# Patient Record
Sex: Male | Born: 1960 | Race: White | Hispanic: No | Marital: Married | Smoking: Current every day smoker
Health system: Southern US, Community
[De-identification: ages and names within clinical notes are randomized; demographics above are authoritative.]

## PROBLEM LIST (undated history)

## (undated) ENCOUNTER — Ambulatory Visit (HOSPITAL_COMMUNITY): Admission: EM | Payer: Medicaid - Out of State | Source: Home / Self Care

## (undated) DIAGNOSIS — R058 Other specified cough: Secondary | ICD-10-CM

## (undated) DIAGNOSIS — I10 Essential (primary) hypertension: Secondary | ICD-10-CM

## (undated) DIAGNOSIS — Z9189 Other specified personal risk factors, not elsewhere classified: Secondary | ICD-10-CM

## (undated) DIAGNOSIS — N201 Calculus of ureter: Secondary | ICD-10-CM

## (undated) DIAGNOSIS — N200 Calculus of kidney: Secondary | ICD-10-CM

## (undated) DIAGNOSIS — E119 Type 2 diabetes mellitus without complications: Secondary | ICD-10-CM

## (undated) DIAGNOSIS — K7460 Unspecified cirrhosis of liver: Secondary | ICD-10-CM

## (undated) DIAGNOSIS — J410 Simple chronic bronchitis: Secondary | ICD-10-CM

## (undated) DIAGNOSIS — Z8669 Personal history of other diseases of the nervous system and sense organs: Secondary | ICD-10-CM

## (undated) DIAGNOSIS — R05 Cough: Secondary | ICD-10-CM

## (undated) DIAGNOSIS — R51 Headache: Secondary | ICD-10-CM

## (undated) DIAGNOSIS — Z87442 Personal history of urinary calculi: Secondary | ICD-10-CM

## (undated) DIAGNOSIS — K7581 Nonalcoholic steatohepatitis (NASH): Secondary | ICD-10-CM

## (undated) HISTORY — PX: EXTRACORPOREAL SHOCK WAVE LITHOTRIPSY: SHX1557

## (undated) HISTORY — PX: CHOLECYSTECTOMY: SHX55

## (undated) HISTORY — PX: KIDNEY STONE SURGERY: SHX686

---

## 2002-01-18 HISTORY — PX: CARDIOVASCULAR STRESS TEST: SHX262

## 2002-10-10 ENCOUNTER — Encounter: Payer: Self-pay | Admitting: Emergency Medicine

## 2002-10-11 ENCOUNTER — Inpatient Hospital Stay (HOSPITAL_COMMUNITY): Admission: EM | Admit: 2002-10-11 | Discharge: 2002-10-13 | Payer: Self-pay | Admitting: Emergency Medicine

## 2002-10-17 ENCOUNTER — Encounter: Admission: RE | Admit: 2002-10-17 | Discharge: 2002-10-17 | Payer: Self-pay | Admitting: Sports Medicine

## 2002-12-27 ENCOUNTER — Emergency Department (HOSPITAL_COMMUNITY): Admission: EM | Admit: 2002-12-27 | Discharge: 2002-12-28 | Payer: Self-pay | Admitting: Emergency Medicine

## 2003-11-13 ENCOUNTER — Emergency Department (HOSPITAL_COMMUNITY): Admission: EM | Admit: 2003-11-13 | Discharge: 2003-11-13 | Payer: Self-pay | Admitting: Emergency Medicine

## 2006-02-23 ENCOUNTER — Emergency Department (HOSPITAL_COMMUNITY): Admission: EM | Admit: 2006-02-23 | Discharge: 2006-02-23 | Payer: Self-pay | Admitting: Family Medicine

## 2006-06-19 HISTORY — PX: OTHER SURGICAL HISTORY: SHX169

## 2006-12-12 ENCOUNTER — Emergency Department (HOSPITAL_COMMUNITY): Admission: EM | Admit: 2006-12-12 | Discharge: 2006-12-12 | Payer: Self-pay | Admitting: Emergency Medicine

## 2007-05-02 ENCOUNTER — Emergency Department (HOSPITAL_COMMUNITY): Admission: EM | Admit: 2007-05-02 | Discharge: 2007-05-02 | Payer: Self-pay | Admitting: Emergency Medicine

## 2007-12-19 HISTORY — PX: OTHER SURGICAL HISTORY: SHX169

## 2008-08-19 ENCOUNTER — Emergency Department (HOSPITAL_COMMUNITY): Admission: EM | Admit: 2008-08-19 | Discharge: 2008-08-20 | Payer: Self-pay | Admitting: Emergency Medicine

## 2008-10-16 ENCOUNTER — Emergency Department (HOSPITAL_COMMUNITY): Admission: EM | Admit: 2008-10-16 | Discharge: 2008-10-16 | Payer: Self-pay | Admitting: Emergency Medicine

## 2009-01-30 ENCOUNTER — Emergency Department (HOSPITAL_COMMUNITY): Admission: EM | Admit: 2009-01-30 | Discharge: 2009-01-30 | Payer: Self-pay | Admitting: Emergency Medicine

## 2009-03-18 ENCOUNTER — Emergency Department (HOSPITAL_COMMUNITY): Admission: EM | Admit: 2009-03-18 | Discharge: 2009-03-19 | Payer: Self-pay | Admitting: Emergency Medicine

## 2009-03-23 ENCOUNTER — Observation Stay (HOSPITAL_COMMUNITY): Admission: EM | Admit: 2009-03-23 | Discharge: 2009-03-24 | Payer: Self-pay | Admitting: Emergency Medicine

## 2010-04-05 LAB — URINALYSIS, ROUTINE W REFLEX MICROSCOPIC
Glucose, UA: 500 mg/dL — AB
Ketones, ur: NEGATIVE mg/dL
Nitrite: NEGATIVE
Protein, ur: 100 mg/dL — AB
Specific Gravity, Urine: 1.025 (ref 1.005–1.030)
Urobilinogen, UA: 4 mg/dL — ABNORMAL HIGH (ref 0.0–1.0)
pH: 6.5 (ref 5.0–8.0)

## 2010-04-05 LAB — CBC
HCT: 49.2 % (ref 39.0–52.0)
Hemoglobin: 16.6 g/dL (ref 13.0–17.0)
MCHC: 33.8 g/dL (ref 30.0–36.0)
MCV: 88.2 fL (ref 78.0–100.0)
Platelets: 141 10*3/uL — ABNORMAL LOW (ref 150–400)
RBC: 5.58 MIL/uL (ref 4.22–5.81)
RDW: 12.8 % (ref 11.5–15.5)
WBC: 7.1 10*3/uL (ref 4.0–10.5)

## 2010-04-05 LAB — BASIC METABOLIC PANEL
BUN: 14 mg/dL (ref 6–23)
CO2: 26 mEq/L (ref 19–32)
Calcium: 9.2 mg/dL (ref 8.4–10.5)
Chloride: 102 mEq/L (ref 96–112)
Creatinine, Ser: 0.75 mg/dL (ref 0.4–1.5)
GFR calc Af Amer: >60 mL/min (ref >60)
GFR calc non Af Amer: >60 mL/min (ref >60)
Glucose, Bld: 213 mg/dL — ABNORMAL HIGH (ref 70–99)
Potassium: 4 mEq/L (ref 3.5–5.1)
Sodium: 135 mEq/L (ref 135–145)

## 2010-04-05 LAB — URINE MICROSCOPIC-ADD ON

## 2010-04-05 LAB — URINE CULTURE: Colony Count: 35000

## 2010-04-12 LAB — URINALYSIS, ROUTINE W REFLEX MICROSCOPIC
Bilirubin Urine: NEGATIVE
Glucose, UA: NEGATIVE mg/dL
Glucose, UA: NEGATIVE mg/dL
Ketones, ur: NEGATIVE mg/dL
Nitrite: NEGATIVE
Nitrite: NEGATIVE
Protein, ur: 100 mg/dL — AB
Protein, ur: 30 mg/dL — AB
Specific Gravity, Urine: 1.019 (ref 1.005–1.030)
Urobilinogen, UA: 1 mg/dL (ref 0.0–1.0)
Urobilinogen, UA: 1 mg/dL (ref 0.0–1.0)
pH: 5.5 (ref 5.0–8.0)

## 2010-04-12 LAB — URINE MICROSCOPIC-ADD ON

## 2010-04-12 LAB — BASIC METABOLIC PANEL
BUN: 23 mg/dL (ref 6–23)
CO2: 24 mEq/L (ref 19–32)
Calcium: 10 mg/dL (ref 8.4–10.5)
Chloride: 104 mEq/L (ref 96–112)
Creatinine, Ser: 1.13 mg/dL (ref 0.4–1.5)
GFR calc Af Amer: >60 mL/min (ref >60)
GFR calc non Af Amer: >60 mL/min (ref >60)
Glucose, Bld: 119 mg/dL — ABNORMAL HIGH (ref 70–99)
Potassium: 3.8 mEq/L (ref 3.5–5.1)
Sodium: 140 mEq/L (ref 135–145)

## 2010-04-12 LAB — CBC
HCT: 49.2 % (ref 39.0–52.0)
MCV: 88.4 fL (ref 78.0–100.0)
MCV: 88.5 fL (ref 78.0–100.0)
Platelets: 159 10*3/uL (ref 150–400)
Platelets: 180 10*3/uL (ref 150–400)
RDW: 12.8 % (ref 11.5–15.5)
WBC: 13.3 10*3/uL — ABNORMAL HIGH (ref 4.0–10.5)

## 2010-04-12 LAB — DIFFERENTIAL
Basophils Absolute: 0.1 10*3/uL (ref 0.0–0.1)
Basophils Absolute: 0.2 10*3/uL — ABNORMAL HIGH (ref 0.0–0.1)
Basophils Relative: 2 % — ABNORMAL HIGH (ref 0–1)
Eosinophils Absolute: 0.2 10*3/uL (ref 0.0–0.7)
Eosinophils Absolute: 0.4 10*3/uL (ref 0.0–0.7)
Eosinophils Relative: 4 % (ref 0–5)
Lymphs Abs: 2.7 10*3/uL (ref 0.7–4.0)
Monocytes Absolute: 0.9 10*3/uL (ref 0.1–1.0)
Neutrophils Relative %: 66 % (ref 43–77)

## 2010-04-12 LAB — URINE CULTURE
Colony Count: NO GROWTH
Culture: NO GROWTH

## 2010-04-12 LAB — POCT I-STAT, CHEM 8
Calcium, Ion: 1.21 mmol/L (ref 1.12–1.32)
Creatinine, Ser: 0.9 mg/dL (ref 0.4–1.5)
Hemoglobin: 17.3 g/dL — ABNORMAL HIGH (ref 13.0–17.0)
Sodium: 140 mEq/L (ref 135–145)
TCO2: 31 mmol/L (ref 0–100)

## 2010-04-24 LAB — URINE MICROSCOPIC-ADD ON

## 2010-04-24 LAB — BASIC METABOLIC PANEL
BUN: 20 mg/dL (ref 6–23)
CO2: 25 mEq/L (ref 19–32)
Calcium: 9.6 mg/dL (ref 8.4–10.5)
Creatinine, Ser: 0.83 mg/dL (ref 0.4–1.5)
GFR calc non Af Amer: >60 mL/min (ref >60)
Glucose, Bld: 88 mg/dL (ref 70–99)
Sodium: 139 mEq/L (ref 135–145)

## 2010-04-24 LAB — URINALYSIS, ROUTINE W REFLEX MICROSCOPIC
Ketones, ur: NEGATIVE mg/dL
Protein, ur: 100 mg/dL — AB
Urobilinogen, UA: 2 mg/dL — ABNORMAL HIGH (ref 0.0–1.0)

## 2010-04-24 LAB — DIFFERENTIAL
Basophils Absolute: 0 10*3/uL (ref 0.0–0.1)
Basophils Relative: 0 % (ref 0–1)
Lymphocytes Relative: 27 % (ref 12–46)
Neutro Abs: 5.8 10*3/uL (ref 1.7–7.7)
Neutrophils Relative %: 61 % (ref 43–77)

## 2010-04-24 LAB — CBC
Hemoglobin: 16.6 g/dL (ref 13.0–17.0)
MCHC: 34.1 g/dL (ref 30.0–36.0)
Platelets: 156 10*3/uL (ref 150–400)
RDW: 13.1 % (ref 11.5–15.5)

## 2010-04-24 LAB — URINE CULTURE
Colony Count: NO GROWTH
Culture: NO GROWTH

## 2010-04-25 LAB — CBC
HCT: 48.5 % (ref 39.0–52.0)
Hemoglobin: 16.7 g/dL (ref 13.0–17.0)
MCHC: 34.5 g/dL (ref 30.0–36.0)
MCV: 88.9 fL (ref 78.0–100.0)
Platelets: 131 10*3/uL — ABNORMAL LOW (ref 150–400)
RBC: 5.46 MIL/uL (ref 4.22–5.81)
RDW: 12.8 % (ref 11.5–15.5)
WBC: 6.7 10*3/uL (ref 4.0–10.5)

## 2010-04-25 LAB — POCT I-STAT, CHEM 8
BUN: 21 mg/dL (ref 6–23)
Calcium, Ion: 1.18 mmol/L (ref 1.12–1.32)
Chloride: 101 mEq/L (ref 96–112)
Creatinine, Ser: 1.1 mg/dL (ref 0.4–1.5)
Glucose, Bld: 108 mg/dL — ABNORMAL HIGH (ref 70–99)
HCT: 51 % (ref 39.0–52.0)
Hemoglobin: 17.3 g/dL — ABNORMAL HIGH (ref 13.0–17.0)
Potassium: 4.2 mEq/L (ref 3.5–5.1)
Sodium: 139 mEq/L (ref 135–145)
TCO2: 29 mmol/L (ref 0–100)

## 2010-04-25 LAB — DIFFERENTIAL
Basophils Absolute: 0 10*3/uL (ref 0.0–0.1)
Basophils Relative: 0 % (ref 0–1)
Eosinophils Absolute: 0.1 10*3/uL (ref 0.0–0.7)
Eosinophils Relative: 1 % (ref 0–5)
Lymphocytes Relative: 17 % (ref 12–46)
Lymphs Abs: 1.2 10*3/uL (ref 0.7–4.0)
Monocytes Absolute: 1 10*3/uL (ref 0.1–1.0)
Monocytes Relative: 15 % — ABNORMAL HIGH (ref 3–12)
Neutro Abs: 4.5 10*3/uL (ref 1.7–7.7)
Neutrophils Relative %: 67 % (ref 43–77)

## 2010-04-25 LAB — URINALYSIS, ROUTINE W REFLEX MICROSCOPIC
Glucose, UA: NEGATIVE mg/dL
pH: 7.5 (ref 5.0–8.0)

## 2010-04-25 LAB — URINE MICROSCOPIC-ADD ON

## 2010-06-05 NOTE — Discharge Summary (Signed)
NAME:  Michael Carroll, Michael Carroll                       ACCOUNT NO.:  [id number]   MEDICAL RECORD NO.:  [id number]                   PATIENT TYPE:  INP   LOCATION:  3727                                 FACILITY:  [facility]   PHYSICIAN:  Emmit Alexanders, M.D.                      DATE OF BIRTH:  04-04-1960   DATE OF ADMISSION:  10/10/2002  DATE OF DISCHARGE:  10/13/2002                                 DISCHARGE SUMMARY   DISCHARGE DIAGNOSES:  1. Chest pain, ruled out myocardial infarction.  2. Hypertension.  3. Elevated liver enzymes.   CONSULTATIONS:  Dr. Elease Hashimoto from cardiology assisted with this patient.   PROCEDURE:  1. The patient underwent a Cardiolite stress test which showed no ischemia.     There was no definite old scar either.  Ejection fraction was estimated     at 60%.  2. Right upper quadrant ultrasound showed limited study, gallstones,     nephrolithiasis, fatty liver.  Bile ducts were obscured.  Recommend CT or     MRI.  3. He had a chest x-ray on October 10, 2002 that showed no acute disease.   OBJECTIVE DATA:  1. CBC.  White blood cell count 8.4, hemoglobin 17.2, hematocrit 50,     platelet count 164, differential is normal.  2. Complete metabolic panel at discharge showed a sodium 142, potassium 3.6,     chloride 108, CO2 22, glucose 101, BUN 16, creatinine 0.9, bilirubin 1.1,     alkaline phosphatase 65, SGOT 114, SGPT 208, albumin 3.4, calcium of 8.7.  3. TSH was normal at 3.146.  4. Cardiac enzymes x3 were negative.  5. Lipid panel showed a total cholesterol of 160, triglyceride of 100, HDL     40, LDL 100.  6. Lipase 65.   HOSPITAL COURSE:  1. Chest pain.  This is a 50 year old white male who was unassigned who     presented to [facility] Emergency Department with complaints of chest     pain.  He states he has a history of myocardial infarction 1-1/2 years     ago.  The patient was admitted under telemetry and ruled out for     myocardial infarction.  He was seen by  Dr. Elease Hashimoto who advised a     Cardiolite stress test which was normal.  He was therefore sent home with     risk factor modification and for followup at [facility].  2. Elevated LFTs.  The patient was found to have elevated liver enzymes on     admission.  This was worked up with a hepatitis panel that is pending at     discharge.  Right upper quadrant ultrasound did show findings stated     above.  The patient was tolerating a full diet by discharge so was     therefore sent home for completion of his workup as  an outpatient.  3. Hypertension.  The patient did have elevated blood pressure to 168/90 at     admission.  He was started on Lisinopril 10 mg daily.  This will need to     be followed as an outpatient.  4. Tobacco abuse.  The patient does admit to smoking two packs per day for     the past 24 years. He was advised to quit.   DISCHARGE MEDICATIONS:  1. Aspirin 325 mg daily.  2. Prilosec 20 mg daily.  3. Lisinopril 10 mg daily.  4. Tylenol p.r.n.  5. Ibuprofen p.r.n.   ACTIVITY:  No restrictions.   DIET:  He is to maintain a low fat and salt diet.   WOUND CARE:  Not applicable.   SPECIAL INSTRUCTIONS:  He needs to stop smoking.   FOLLOW UP:  He is to call the [facility] at [phone] to schedule  an appointment with Dr. Para March in two weeks to complete workup of his  elevated liver enzymes as well as continued stressing of risk factor  modification.                                                 Emmit Alexanders, M.D.    DV/MEDQ  D:  10/13/2002  T:  10/14/2002  Job:  [id number]   cc:   Vesta Mixer, M.D.  [address]  [place]  [place] [address]  Fax: [phone]

## 2010-10-13 LAB — CBC
MCHC: 34.1
Platelets: 157
RDW: 13

## 2010-10-13 LAB — URINALYSIS, ROUTINE W REFLEX MICROSCOPIC
Bilirubin Urine: NEGATIVE
Glucose, UA: NEGATIVE
Protein, ur: NEGATIVE

## 2010-10-13 LAB — COMPREHENSIVE METABOLIC PANEL
ALT: 157 — ABNORMAL HIGH
AST: 92 — ABNORMAL HIGH
Albumin: 3.6
Calcium: 9.1
GFR calc Af Amer: >60
Potassium: 4.5
Sodium: 137
Total Protein: 6.8

## 2010-10-13 LAB — URINE CULTURE: Culture: NO GROWTH

## 2010-10-13 LAB — DIFFERENTIAL
Eosinophils Absolute: 0.2
Eosinophils Relative: 2
Lymphs Abs: 2.1
Monocytes Absolute: 1.1 — ABNORMAL HIGH
Monocytes Relative: 9
Neutrophils Relative %: 71

## 2010-10-13 LAB — URINE MICROSCOPIC-ADD ON

## 2010-10-27 LAB — POCT CARDIAC MARKERS
CKMB, poc: 1.1
Myoglobin, poc: 140
Operator id: 198171
Troponin i, poc: <0.05

## 2010-10-27 LAB — I-STAT 8, (EC8 V) (CONVERTED LAB)
Chloride: 106
Glucose, Bld: 80
Potassium: 4.1
pH, Ven: 7.318 — ABNORMAL HIGH

## 2010-10-27 LAB — D-DIMER, QUANTITATIVE: D-Dimer, Quant: 0.25

## 2011-02-01 ENCOUNTER — Emergency Department (HOSPITAL_COMMUNITY)
Admission: EM | Admit: 2011-02-01 | Discharge: 2011-02-02 | Disposition: A | Discharge status: Home / Self Care | Payer: Medicaid Other | Attending: Emergency Medicine | Admitting: Emergency Medicine

## 2011-02-01 ENCOUNTER — Encounter (HOSPITAL_COMMUNITY): Payer: Self-pay | Admitting: *Deleted

## 2011-02-01 DIAGNOSIS — S93401A Sprain of unspecified ligament of right ankle, initial encounter: Secondary | ICD-10-CM

## 2011-02-01 DIAGNOSIS — S99919A Unspecified injury of unspecified ankle, initial encounter: Secondary | ICD-10-CM | POA: Insufficient documentation

## 2011-02-01 DIAGNOSIS — Z79899 Other long term (current) drug therapy: Secondary | ICD-10-CM | POA: Insufficient documentation

## 2011-02-01 DIAGNOSIS — M25579 Pain in unspecified ankle and joints of unspecified foot: Secondary | ICD-10-CM | POA: Insufficient documentation

## 2011-02-01 DIAGNOSIS — M79609 Pain in unspecified limb: Secondary | ICD-10-CM | POA: Insufficient documentation

## 2011-02-01 DIAGNOSIS — M545 Low back pain, unspecified: Secondary | ICD-10-CM | POA: Insufficient documentation

## 2011-02-01 DIAGNOSIS — M25469 Effusion, unspecified knee: Secondary | ICD-10-CM | POA: Insufficient documentation

## 2011-02-01 DIAGNOSIS — S8390XA Sprain of unspecified site of unspecified knee, initial encounter: Secondary | ICD-10-CM

## 2011-02-01 DIAGNOSIS — W19XXXA Unspecified fall, initial encounter: Secondary | ICD-10-CM

## 2011-02-01 DIAGNOSIS — S93609A Unspecified sprain of unspecified foot, initial encounter: Secondary | ICD-10-CM | POA: Insufficient documentation

## 2011-02-01 DIAGNOSIS — S93409A Sprain of unspecified ligament of unspecified ankle, initial encounter: Secondary | ICD-10-CM | POA: Insufficient documentation

## 2011-02-01 DIAGNOSIS — S335XXA Sprain of ligaments of lumbar spine, initial encounter: Secondary | ICD-10-CM | POA: Insufficient documentation

## 2011-02-01 DIAGNOSIS — W108XXA Fall (on) (from) other stairs and steps, initial encounter: Secondary | ICD-10-CM | POA: Insufficient documentation

## 2011-02-01 DIAGNOSIS — S8990XA Unspecified injury of unspecified lower leg, initial encounter: Secondary | ICD-10-CM | POA: Insufficient documentation

## 2011-02-01 DIAGNOSIS — IMO0002 Reserved for concepts with insufficient information to code with codable children: Secondary | ICD-10-CM | POA: Insufficient documentation

## 2011-02-01 DIAGNOSIS — S39012A Strain of muscle, fascia and tendon of lower back, initial encounter: Secondary | ICD-10-CM

## 2011-02-01 NOTE — ED Notes (Signed)
The pt says he fell 2 hours ago.  C/o rt knee  Rt foot and pain innhis lower back.  hehas numbness in his rt fingers

## 2011-02-02 ENCOUNTER — Emergency Department (HOSPITAL_COMMUNITY): Payer: Medicaid Other

## 2011-02-02 MED ORDER — IBUPROFEN 800 MG PO TABS
800.0000 mg | ORAL_TABLET | Freq: Once | ORAL | Status: AC
  Administered 2011-02-02: 800 mg via ORAL
  Filled 2011-02-02: qty 1

## 2011-02-02 MED ORDER — HYDROCODONE-ACETAMINOPHEN 5-325 MG PO TABS: 2.0000 | ORAL_TABLET | ORAL | Status: AC | PRN

## 2011-02-02 MED ORDER — OXYCODONE-ACETAMINOPHEN 5-325 MG PO TABS
2.0000 | ORAL_TABLET | Freq: Once | ORAL | Status: AC
  Administered 2011-02-02: 2 via ORAL
  Filled 2011-02-02: qty 2

## 2011-02-02 MED ORDER — DIAZEPAM 5 MG PO TABS: 5.0000 mg | ORAL_TABLET | Freq: Three times a day (TID) | ORAL | Status: AC | PRN

## 2011-02-02 MED ORDER — IBUPROFEN 800 MG PO TABS: 800.0000 mg | ORAL_TABLET | Freq: Three times a day (TID) | ORAL | Status: AC | PRN

## 2011-02-02 NOTE — ED Notes (Signed)
Post op boot and crutches completed by Ortho.

## 2011-02-02 NOTE — ED Notes (Signed)
Ortho made aware of boot and crutches

## 2011-02-02 NOTE — ED Notes (Signed)
Report given to Melissa RN

## 2011-02-02 NOTE — ED Notes (Signed)
Pt stated that he fell on his rt leg about three hours ago. Since then he has been having R knee, rt foot, and back pain. Pain is 8 in foot, 7 in knee, and 5 in back out of 10. He stated that he twisted his leg during fall. R foot and R knee swelling.  Warm to touch compared to Left side. Will continue to monitor.

## 2011-02-02 NOTE — Discharge Instructions (Signed)
Please instructions below. As discussed your x-rays tonight were all negative for broken bones. A ligament injury to the knee cannot be ruled out. You should use the crutches and do not  bear weight on the right leg until you have followed up with the orthopedic physician. Take the muscle relaxer and medication for pain as directed. Return for worsening symptoms. Otherwise call today to arrange follow up with Dr. Charlann Boxer for some time in the next few days or at his discretion.  Ankle Sprain  An ankle sprain is an injury to the ligaments that hold the ankle joint together.  CAUSES The injury is usually caused by a fall or by twisting the ankle. It is important to tell your caregiver how the injury occurred and whether or not you were able to walk immediately after the injury.  SYMPTOMS  Pain is the primary symptom. It may be present at rest or only when you are trying to stand or walk. The ankle will likely be swollen. Bruising may develop immediately or after 1 or 2 days. It may be difficult or impossible to stand or walk. This depends on the severity of the sprain. DIAGNOSIS  Your caregiver can determine if a sprain has occurred based on the accident details and on examination of your ankle. Examination will include pressing and squeezing areas of the foot and ankle. Your caregiver will try to move the ankle in certain ways. X-rays may be used to be sure a bone was not broken, or that the ligament did not pull off of a bone (avulsion). There are standard guidelines that can reliably determine if an X-ray is needed. TREATMENT  Rest, ice, elevation, and compression are the basic modes of treatment. Certain types of braces can help stabilize the ankle and allow early return to walking. Your caregiver can make a recommendation for this. Medication may be recommended for pain. You may be referred to an orthopedist or a physical therapist for certain types of severe sprains. HOME CARE INSTRUCTIONS   Apply ice  to the sore area for 15 to 20 minutes, 3 to 4 times per day. Do this while you are awake for the first 2 days, or as directed. This can be stopped when the swelling goes away. Put the ice in a plastic bag and place a towel between the bag of ice and your skin.   Keep your leg elevated when possible to lessen swelling.   If your caregiver recommends crutches, use them as instructed with a non-weight bearing cast for 1 week. Then, you may walk on your ankle as the pain allows, or as instructed. Gradually, put weight on the affected ankle. Continue to use crutches or a cane until you can walk without causing pain.   If a plaster splint was applied, wear the splint until you are seen for a follow-up examination. Rest it on nothing harder than a pillow the first 24 hours. Do not put weight on it. Do not get it wet. You may take it off to take a shower or bath.   You may have been given an elastic bandage to use with the plaster splint, or you may have been given a elastic bandage to use alone. The elastic bandage is too tight if you have numbness, tingling, or if your foot becomes cold and blue. Adjust the bandage to make it comfortable.   If an air splint was applied, you may blow more air into it or take some out to make it  more comfortable. You may take it off at night and to take a shower or bath. Wiggle your toes in the splint several times per day if you are able.   Only take over-the-counter or prescription medicines for pain, discomfort, or fever as directed by your caregiver.   Do not drive a vehicle until your caregiver specifically tells you it is safe to do so.  SEEK MEDICAL CARE IF:   You have an increase in bruising, swelling, or pain.   Your toes feel cold.   Pain relief is not achieved with medications.  SEEK IMMEDIATE MEDICAL CARE IF: Your toes are numb or blue or you have severe pain. MAKE SURE YOU:   Understand these instructions.   Will watch your condition.   Will get  help right away if you are not doing well or get worse.  Document Released: 01/04/2005 Document Revised: 04/10/2010 Document Reviewed: 08/09/2007 [facility] Patient Information 2012 [place], [place].  Foot Sprain  You have a sprained foot. When you twist your foot, the ligaments that hold the joints together are injured. This may cause pain, swelling, bruising, and difficulty walking. Proper treatment will shorten your disability and help you prevent re-injury. To treat a sprained foot you should:  Elevate your foot for the next 2-3 days to reduce swelling.   Apply ice packs to the foot for 20-30 minutes every 2-3 hours.   Wrap your foot with a compression bandage as long as it is swollen or tender.   Do not walk on your foot if it still hurts a lot.  Use crutches or a cane until weight bearing becomes painless.   Special podiatric shoes or shoes with rigid soles may be useful in allowing earlier walking.  Only take over-the-counter or prescription medicines for pain, discomfort, or fever as directed by your caregiver. Most foot sprains will heal completely in 3-6 weeks with proper rest.  If you still have pain or swelling after 2-3 weeks, or if your pain worsens, you should see your doctor for further evaluation. Document Released: 02/12/2004 Document Revised: 09/16/2010 Document Reviewed: 01/06/2008 [facility] Patient Information 2012 [place], [place].  Knee Sprain  You have a knee sprain. Sprains are painful injuries to the joints. A sprain is a partial or complete tearing of ligaments. Ligaments are tough, fibrous tissues that hold bones together at the joints. A strain (sprain) has occurred when a ligament is stretched or damaged. This injury may take several weeks to heal. This is often the same length of time as a bone fracture (break in bone) takes to heal. Even though a fracture (bone break) may not have occurred, the recovery times may be similar. HOME CARE INSTRUCTIONS   Rest the  injured area for as long as directed by your caregiver. Then slowly start using the joint as directed by your caregiver and as the pain allows. Use crutches as directed. If the knee was splinted or casted, continue use and care as directed. If an ace bandage has been applied today, it should be removed and reapplied every 3 to 4 hours. It should not be applied tightly, but firmly enough to keep swelling down. Watch toes and feet for swelling, bluish discoloration, coldness, numbness or excessive pain. If any of these symptoms occur, remove the ace bandage and reapply more loosely.If these symptoms persist, seek medical attention.   For the first 24 hours, lie down. Keep the injured extremity elevated on two pillows.   Apply ice to the injured area for 15  to 20 minutes every couple hours. Repeat this 3 to 4 times per day for the first 48 hours. Put the ice in a plastic bag and place a towel between the bag of ice and your skin.   Wear any splinting, casting, or elastic bandage applications as instructed.   Only take over-the-counter or prescription medicines for pain, discomfort, or fever as directed by your caregiver. Do not use aspirin immediately after the injury unless instructed by your caregiver. Aspirin can cause increased bleeding and bruising of the tissues.   If you were given crutches, continue to use them as instructed. Do not resume weight bearing on the affected extremity until instructed.  Persistent pain and inability to use the injured area as directed for more than 2 to 3 days are warning signs. If this happens you should see a caregiver for a follow-up visit as soon as possible. Initially, a hairline fracture (this is the same as a broken bone) may not be evident on x-rays. Persistent pain and swelling indicate that further evaluation, non-weight bearing (use of crutches as instructed), and/or further x-rays are indicated. X-rays may sometimes not show a small fracture until a week or  ten days later. Make a follow-up appointment with your own caregiver or one to whom we have referred you. A radiologist (specialist in reading x-rays) may re-read your X-rays. Make sure you know how you are to get your x-ray results. Do not assume everything is normal if you do not hear from [place]. SEEK MEDICAL CARE IF:   Bruising, swelling, or pain increases.   You have cold or numb toes   You have continuing difficulty or pain with walking.  SEEK IMMEDIATE MEDICAL CARE IF:   Your toes are cold, numb or blue.   The pain is not responding to medications and continues to stay the same or get worse.  MAKE SURE YOU:   Understand these instructions.   Will watch your condition.   Will get help right away if you are not doing well or get worse.  Document Released: 01/04/2005 Document Revised: 09/16/2010 Document Reviewed: 12/19/2006 [facility] Patient Information 2012 [place], [place].  Knee Wraps (Elastic Bandage) and RICE  Knee wraps come in many different shapes and sizes and perform many different functions. Some wraps may provide cold therapy or warmth. Your caregiver will help you to determine what is best for your protection, or recovery following your injury. The following are some general tips to help you use a knee wrap:  Use the wrap as directed.   Do not keep the wrap so tight that it cuts off the circulation of the leg below the wrap.   If your lower leg becomes blue, loses feeling, or becomes swollen below the wrap, it is probably too tight. Loosen the wrap as needed to improve these problems.   See your caregiver or trainer if the wrap seems to be making your problems worse rather than better.  Wraps in general help to remind you that you have an injury. They provide limited support. The few pounds of support they provide are minimal considering the hundreds of pounds of pressure it takes to injure a joint or tear ligaments.  The routine care of many injuries includes Rest,  Ice, Compression, and Elevation (RICE).  Rest is required to allow your body to heal. Generally following bumps and bruises, routine activities can be resumed when comfortable. Injured tendons (cord-like structures that attach muscle to bone) and bones take approximately 6 to 12  weeks to heal.   Ice following an injury helps keep the swelling down and reduces pain. Do not apply ice directly to skin. Apply ice bags for 20-30 minutes every 3-4 hours for the first 2-3 days following injury or surgery. Place ice in a plastic bag with a towel around it.   Compression helps keep swelling down, gives support, and helps with discomfort. If a knee wrap has been applied, it should be removed and reapplied every 3 to 4 hours. It should be applied firmly enough to keep swelling down, but not too tightly. Watch your lower leg and toes for swelling, bluish discoloration, coldness, numbness or excessive pain. If any of these symptoms (problems) occur, remove the knee wrap and reapply more loosely. If these symptoms persist, contact your caregiver immediately.   Elevation helps reduce swelling, and decreases pain. With extremities (arms/hands and legs/feet), the injured area should be placed near to or above the level of the heart if possible.  Persistent pain and inability to use the injured area for more than 2 to 3 days are warning signs indicating that you should see a caregiver for a follow-up visit as soon as possible. Initially, a hairline fracture (this is the same as a broken bone) may not be seen on x-rays.  Persistent pain and swelling mean limitedweight bearing (use of crutches as instructed) should continue. You may need further x-rays.  X-rays may not show a non-displaced fracture until a week or ten days later. Make a follow-up appointment with your caregiver. A radiologist (a specialist in reading x-rays) will re-read your x-rays. Make sure you know how to get your x-ray results. Do not assume everything  is normal if you do not hear from your caregiver. MAKE SURE YOU:   Understand these instructions.   Will watch your condition.   Will get help right away if you are not doing well or get worse.  Document Released: 06/26/2001 Document Revised: 09/16/2010 Document Reviewed: 04/25/2008 [facility] Patient Information 2012 [place], [place].  Lumbosacral Strain  Lumbosacral strain is one of the most common causes of back pain. There are many causes of back pain. Most are not serious conditions. CAUSES  Your backbone (spinal column) is made up of 24 main vertebral bodies, the sacrum, and the coccyx. These are held together by muscles and tough, fibrous tissue (ligaments). Nerve roots pass through the openings between the vertebrae. A sudden move or injury to the back may cause injury to, or pressure on, these nerves. This may result in localized back pain or pain movement (radiation) into the buttocks, down the leg, and into the foot. Sharp, shooting pain from the buttock down the back of the leg (sciatica) is frequently associated with a ruptured (herniated) disk. Pain may be caused by muscle spasm alone. Your caregiver can often find the cause of your pain by the details of your symptoms and an exam. In some cases, you may need tests (such as X-rays). Your caregiver will work with you to decide if any tests are needed based on your specific exam. HOME CARE INSTRUCTIONS   Avoid an underactive lifestyle. Active exercise, as directed by your caregiver, is your greatest weapon against back pain.   Avoid hard physical activities (tennis, racquetball, waterskiing) if you are not in proper physical condition for it. This may aggravate or create problems.   If you have a back problem, avoid sports requiring sudden body movements. Swimming and walking are generally safer activities.   Maintain good posture.  Avoid becoming overweight (obese).   Use bed rest for only the most extreme, sudden (acute)  episode. Your caregiver will help you determine how much bed rest is necessary.   For acute conditions, you may put ice on the injured area.   Put ice in a plastic bag.   Place a towel between your skin and the bag.   Leave the ice on for 15 to 20 minutes at a time, every 2 hours, or as needed.   After you are improved and more active, it may help to apply heat for 30 minutes before activities.  See your caregiver if you are having pain that lasts longer than expected. Your caregiver can advise appropriate exercises or therapy if needed. With conditioning, most back problems can be avoided. SEEK IMMEDIATE MEDICAL CARE IF:   You have numbness, tingling, weakness, or problems with the use of your arms or legs.   You experience severe back pain not relieved with medicines.   There is a change in bowel or bladder control.   You have increasing pain in any area of the body, including your belly (abdomen).   You notice shortness of breath, dizziness, or feel faint.   You feel sick to your stomach (nauseous), are throwing up (vomiting), or become sweaty.   You notice discoloration of your toes or legs, or your feet get very cold.   Your back pain is getting worse.   You have a fever.  MAKE SURE YOU:   Understand these instructions.   Will watch your condition.   Will get help right away if you are not doing well or get worse.  Document Released: 10/14/2004 Document Revised: 09/16/2010 Document Reviewed: 04/05/2008 [facility] Patient Information 2012 [place], [place].

## 2011-02-02 NOTE — ED Notes (Signed)
Ortho at bedside.

## 2011-02-03 NOTE — ED Provider Notes (Signed)
History     CSN: [id number]  Arrival date & time 02/01/11  2105   First MD Initiated Contact with Patient 02/02/11 0014      Chief Complaint  Patient presents with  . Knee Injury    HPI: Patient is a 51 y.o. male presenting with knee pain. The history is provided by the patient.  Knee Pain This is a new problem. The current episode started today. The problem has been gradually worsening. Pertinent negatives include no weakness. The symptoms are aggravated by standing. He has tried nothing for the symptoms.  Pt reports fall while attempting to come down 2 to 3 small steps. Slipped on step and in an attempt to prevent falling; describes violent twisting type motions. Now c/o (R) knee, ankle, foot and LBP.  History reviewed. No pertinent past medical history.  History reviewed. No pertinent past surgical history.  History reviewed. No pertinent family history.  History  Substance Use Topics  . Smoking status: Current Everyday Smoker  . Smokeless tobacco: Not on file  . Alcohol Use: No      Review of Systems  Constitutional: Negative.   HENT: Negative.   Eyes: Negative.   Respiratory: Negative.   Cardiovascular: Negative.   Gastrointestinal: Negative.   Genitourinary: Negative.   Musculoskeletal: Negative.   Skin: Negative.   Neurological: Negative.  Negative for weakness.  Hematological: Negative.   Psychiatric/Behavioral: Negative.     Allergies  Compazine  Home Medications   Current Outpatient Rx  Name Route Sig Dispense Refill  . METFORMIN HCL 1000 MG PO TABS Oral Take 1,000 mg by mouth 2 (two) times daily with a meal.    . DIAZEPAM 5 MG PO TABS Oral Take 1 tablet (5 mg total) by mouth every 8 (eight) hours as needed. Muscle pain/spasms 10 tablet 0  . HYDROCODONE-ACETAMINOPHEN 5-325 MG PO TABS Oral Take 2 tablets by mouth every 4 (four) hours as needed for pain. 20 tablet 0  . IBUPROFEN 800 MG PO TABS Oral Take 1 tablet (800 mg total) by mouth every 8  (eight) hours as needed for pain. 1 PO TID x 3 days then PRN onlr 15 tablet 0    BP 138/89  Pulse 70  Temp(Src) 99 F (37.2 C) (Oral)  Resp 20  SpO2 96%  Physical Exam  Constitutional: He appears well-developed and well-nourished.  HENT:  Head: Normocephalic and atraumatic.  Eyes: Conjunctivae are normal.  Neck: Neck supple.  Cardiovascular: Normal rate and regular rhythm.   Pulmonary/Chest: Effort normal and breath sounds normal.  Abdominal: Soft. Bowel sounds are normal.  Musculoskeletal: Normal range of motion.       Back:       Legs:      TTP to LS spine w/o objective signs of tx  Mild erythema and swelling to (R) knee, ankle (medially and laterally), and dorsal (R) foot w/o obvious signs of deformity.  Neurological: He is alert.  Skin: Skin is warm and dry.  Psychiatric: He has a normal mood and affect.    ED Course  Procedures Findings and clinical impression discussed w/ pt. Will place ace wraps to (R) knee, ankle/foot, place post-op boot to (R) foot and fit for crutches. Explained imporatnce of ortho f/u as ligament injury to (R) knee and possible missed foot fx due to swelling. Will plan for d/c home w/ medication for pain and provide ortho f/u. Pt agreeable w/ plan.  Labs Reviewed - No data to display Dg Lumbar Spine Complete  02/02/2011  *RADIOLOGY REPORT*  Clinical Data: Status post fall down multiple steps; lower back and right lower leg pain.  LUMBAR SPINE - COMPLETE 4+ VIEW  Comparison: Abdominal radiograph performed 03/23/2009, and CT of the abdomen and pelvis performed 01/30/2009  Findings: There is no evidence of fracture or subluxation. Vertebral bodies demonstrate normal height and alignment. Intervertebral disc spaces are preserved.  The visualized neural foramina are grossly unremarkable in appearance.  The visualized bowel gas pattern is unremarkable in appearance; air and stool are noted within the colon.  The sacroiliac joints are within normal limits.   IMPRESSION: No evidence of fracture or subluxation along the lumbar spine.  Original Report Authenticated By: Tonia Ghent, M.D.   Dg Ankle Complete Right  02/02/2011  *RADIOLOGY REPORT*  Clinical Data: Status post fall down multiple steps, with right lower leg pain.  RIGHT ANKLE - COMPLETE 3+ VIEW  Comparison: None.  Findings: There is no evidence of fracture or dislocation.  The ankle mortise is intact; the interosseous space is within normal limits.  No talar tilt or subluxation is seen.  A posterior calcaneal spur is incidentally noted.  The joint spaces are preserved.  No significant soft tissue abnormalities are seen.  IMPRESSION: No evidence of fracture or dislocation.  Original Report Authenticated By: Tonia Ghent, M.D.   Dg Knee Complete 4 Views Right  02/02/2011  *RADIOLOGY REPORT*  Clinical Data: Status post fall down multiple steps, with right lower leg pain.  RIGHT KNEE - COMPLETE 4+ VIEW  Comparison: None.  Findings: There is no evidence of fracture or dislocation.  The joint spaces are preserved.  No significant degenerative change is seen; the patellofemoral joint is grossly unremarkable in appearance.  An enthesophyte is noted arising at the superior pole of the patella.  Trace joint fluid remains within normal limits.  The visualized soft tissues are normal in appearance.  IMPRESSION: No evidence of fracture or dislocation.  Original Report Authenticated By: Tonia Ghent, M.D.   Dg Foot Complete Right  02/02/2011  *RADIOLOGY REPORT*  Clinical Data: Status post fall down multiple steps, with right lower leg pain.  RIGHT FOOT COMPLETE - 3+ VIEW  Comparison: None.  Findings: There is no evidence of fracture or dislocation.  The joint spaces are preserved.  There is no evidence of talar subluxation; the subtalar joint is unremarkable in appearance.  A posterior calcaneal spur is incidentally noted.  Linear lucency within the fifth proximal phalanx is thought to reflect overlying skin  folds.  No significant soft tissue abnormalities are seen.  IMPRESSION: No evidence of fracture or dislocation.  Original Report Authenticated By: Tonia Ghent, M.D.     1. Lumbar strain   2. Right ankle sprain   3. Foot sprain   4. Knee sprain   5. Fall       MDM  HPI/PE and clinical findings c/w 1 Fall 2..Lumbar strain 3.(R) ankle sprain 4. (R) foot sprain 5. (R) Knee sprain          Leanne Chang, NP 02/04/11 [phone]

## 2011-02-04 NOTE — ED Provider Notes (Signed)
Medical screening examination/treatment/procedure(s) were performed by non-physician practitioner and as supervising physician I was immediately available for consultation/collaboration.  Flint Melter, MD 02/04/11 1308

## 2011-08-10 ENCOUNTER — Emergency Department (HOSPITAL_BASED_OUTPATIENT_CLINIC_OR_DEPARTMENT_OTHER)
Admission: EM | Admit: 2011-08-10 | Discharge: 2011-08-10 | Disposition: A | Discharge status: Home / Self Care | Payer: Medicaid Other | Attending: Emergency Medicine | Admitting: Emergency Medicine

## 2011-08-10 ENCOUNTER — Emergency Department (HOSPITAL_BASED_OUTPATIENT_CLINIC_OR_DEPARTMENT_OTHER): Payer: Medicaid Other

## 2011-08-10 ENCOUNTER — Encounter (HOSPITAL_BASED_OUTPATIENT_CLINIC_OR_DEPARTMENT_OTHER): Payer: Self-pay | Admitting: *Deleted

## 2011-08-10 DIAGNOSIS — R109 Unspecified abdominal pain: Secondary | ICD-10-CM

## 2011-08-10 DIAGNOSIS — R10A0 Flank pain, unspecified side: Secondary | ICD-10-CM

## 2011-08-10 DIAGNOSIS — I10 Essential (primary) hypertension: Secondary | ICD-10-CM | POA: Insufficient documentation

## 2011-08-10 DIAGNOSIS — E119 Type 2 diabetes mellitus without complications: Secondary | ICD-10-CM | POA: Insufficient documentation

## 2011-08-10 DIAGNOSIS — N200 Calculus of kidney: Secondary | ICD-10-CM

## 2011-08-10 DIAGNOSIS — F17200 Nicotine dependence, unspecified, uncomplicated: Secondary | ICD-10-CM | POA: Insufficient documentation

## 2011-08-10 HISTORY — DX: Essential (primary) hypertension: I10

## 2011-08-10 LAB — BASIC METABOLIC PANEL
BUN: 21 mg/dL (ref 6–23)
GFR calc non Af Amer: >90 mL/min (ref >90)
Glucose, Bld: 155 mg/dL — ABNORMAL HIGH (ref 70–99)
Potassium: 4.1 mEq/L (ref 3.5–5.1)

## 2011-08-10 LAB — URINALYSIS, ROUTINE W REFLEX MICROSCOPIC
Bilirubin Urine: NEGATIVE
Hgb urine dipstick: NEGATIVE
Specific Gravity, Urine: 1.025 (ref 1.005–1.030)
Urobilinogen, UA: 2 mg/dL — ABNORMAL HIGH (ref 0.0–1.0)

## 2011-08-10 MED ORDER — HYDROMORPHONE HCL PF 1 MG/ML IJ SOLN
1.0000 mg | Freq: Once | INTRAMUSCULAR | Status: AC
  Administered 2011-08-10: 1 mg via INTRAVENOUS
  Filled 2011-08-10: qty 1

## 2011-08-10 MED ORDER — OXYCODONE-ACETAMINOPHEN 5-325 MG PO TABS: 1.0000 | ORAL_TABLET | Freq: Four times a day (QID) | ORAL | Status: AC | PRN

## 2011-08-10 MED ORDER — OXYCODONE-ACETAMINOPHEN 5-325 MG PO TABS
1.0000 | ORAL_TABLET | Freq: Once | ORAL | Status: DC
  Filled 2011-08-10: qty 1

## 2011-08-10 MED ORDER — KETOROLAC TROMETHAMINE 30 MG/ML IJ SOLN
30.0000 mg | Freq: Once | INTRAMUSCULAR | Status: AC
  Administered 2011-08-10: 30 mg via INTRAVENOUS
  Filled 2011-08-10: qty 1

## 2011-08-10 NOTE — ED Provider Notes (Signed)
Medical screening examination/treatment/procedure(s) were conducted as a shared visit with non-physician practitioner(s) and myself.  I personally evaluated the patient during the encounter   Long history of multiple renal stones, numerous prior stents reports he passed stone several days ago and had improvement in pain initially but returned today. Last imaging was about 2.5 years ago.   Charles B. Bernette Mayers, MD 08/10/11 1241

## 2011-08-10 NOTE — ED Notes (Signed)
Pt amb to room 4 with quick steady gait in nad. Pt reports his usual kidney stone pain x 2 days with "a little" hematuria, frequency and pain to right flank radiating to rlq.

## 2011-08-10 NOTE — ED Provider Notes (Signed)
History     CSN: [id number]  Arrival date & time 08/10/11  1149   First MD Initiated Contact with Patient 08/10/11 1206      Chief Complaint  Patient presents with  . Flank Pain    (Consider location/radiation/quality/duration/timing/severity/associated sxs/prior treatment) HPI Comments: 51 y/o male with history of kidney stones presents with 2 days of right low back pain and possible stone. States 2 weeks ago he passed a "pea sized" gray stone and has not had pain again until 2 days ago. Aleve provides no relief. Rates pain 8/10. Admits to slight nausea and 4 episodes of mild hematuria. Denies any abdominal or pelvic pain, dysuria, urgency, hesitancy, fever, chills, vomiting. Had multiple stents placed in the past which have been removed- performed both at [facility] and [facility]. Admits to being outside a lot recently. Daily smoker. He is trying to keep himself well hydrated.   Patient is a 51 y.o. male presenting with flank pain. The history is provided by the patient and the spouse.  Flank Pain Associated symptoms include nausea. Pertinent negatives include no abdominal pain, chest pain, chills, fever or vomiting.    Past Medical History  Diagnosis Date  . Kidney stones   . Kidney stones   . Hypertension   . Diabetes mellitus     History reviewed. No pertinent past surgical history.  History reviewed. No pertinent family history.  History  Substance Use Topics  . Smoking status: Current Everyday Smoker  . Smokeless tobacco: Not on file  . Alcohol Use: No      Review of Systems  Constitutional: Negative for fever and chills.  Respiratory: Negative for shortness of breath.   Cardiovascular: Negative for chest pain.  Gastrointestinal: Positive for nausea. Negative for vomiting and abdominal pain.  Genitourinary: Positive for hematuria and flank pain. Negative for dysuria, frequency and difficulty urinating.  Musculoskeletal: Positive for back pain.     Allergies  Compazine  Home Medications   Current Outpatient Rx  Name Route Sig Dispense Refill  . METFORMIN HCL 1000 MG PO TABS Oral Take 1,000 mg by mouth 2 (two) times daily with a meal.      BP 153/90  Pulse 84  Temp 98.8 F (37.1 C) (Oral)  Resp 18  SpO2 97%  Physical Exam  Constitutional: He is oriented to person, place, and time. He appears well-developed and well-nourished.       Uncomfortable at rest  HENT:  Head: Normocephalic and atraumatic.  Eyes: Conjunctivae are normal.  Abdominal: Soft. Bowel sounds are normal. There is tenderness in the right lower quadrant and suprapubic area. There is CVA tenderness (right sided). There is no rigidity, no rebound and no guarding.  Neurological: He is alert and oriented to person, place, and time.  Skin: Skin is warm. No rash noted.  Psychiatric: He has a normal mood and affect. His behavior is normal.    ED Course  Procedures (including critical care time)   Labs Reviewed  URINALYSIS, ROUTINE W REFLEX MICROSCOPIC  BASIC METABOLIC PANEL   No results found. Ct Abdomen Pelvis Wo Contrast  08/10/2011  *RADIOLOGY REPORT*  Clinical Data: Right flank pain and hematuria for 3 weeks.  History of renal calculi and stent placement.  CT ABDOMEN AND PELVIS WITHOUT CONTRAST  Technique:  Multidetector CT imaging of the abdomen and pelvis was performed following the standard protocol without intravenous contrast.  Comparison: CT urogram 01/30/2009.  Findings: Right ureteral stent has been removed in the interval. There  are three right-sided renal calyceal calculi which have enlarged compared with the prior study.  The largest is in the lower pole, measuring 4 mm in diameter.  There is no ureteral dilatation or evidence of ureteral calculus.  The right renal pelvis is mildly distended with medium density fluid.  There also appears to be increased density within the urinary bladder.  The left kidney appears unremarkable without calculi or  hydronephrosis.  The lung bases are clear.  Hepatic steatosis is again noted.  The gallbladder is contracted.  The spleen, pancreas and adrenal glands appear normal.  No inflammatory changes or enlarged lymph nodes are seen.  The bowel gas pattern is normal.  The appendix appears normal.  The prostate gland and seminal vesicles appear unchanged.  There are no acute or suspicious osseous findings.  IMPRESSION:  1.  Interval right ureteral stent removal.  There is mild dilatation of the right renal pelvis with medium density fluid suspicious for hemorrhage.  There may be hemorrhage within the urinary bladder as well.  Clinical follow-up and correlation with urinalysis recommended to exclude other etiologies. 2.  No ureteral calculus or ureteral dilatation identified. 3.  Interval enlargement of three small right renal calyceal calculi.  Original Report Authenticated By: Gerrianne Scale, M.D.    No diagnosis found.    MDM  51 y/o male with history of kidney stones presenting with possible stone. Uncomfortable at rest and will control pain with percocet. Will obtain BMP, UA, CT abdomen/pelvis- last scan 01/2009.  1:38 PM CT with possible bladder hemorrhage if urine shows hematuria. Urine negative for hematuria. No presence of stone. Probable ureteral spasm s/p passing stone. Highly encourage urology follow up. Discharge home with pain control.        Trevor Mace, PA-C 08/10/11 1339

## 2012-03-21 ENCOUNTER — Emergency Department (HOSPITAL_BASED_OUTPATIENT_CLINIC_OR_DEPARTMENT_OTHER)
Admission: EM | Admit: 2012-03-21 | Discharge: 2012-03-21 | Disposition: A | Discharge status: Home / Self Care | Payer: Self-pay | Attending: Emergency Medicine | Admitting: Emergency Medicine

## 2012-03-21 ENCOUNTER — Encounter (HOSPITAL_BASED_OUTPATIENT_CLINIC_OR_DEPARTMENT_OTHER): Payer: Self-pay | Admitting: *Deleted

## 2012-03-21 ENCOUNTER — Emergency Department (HOSPITAL_BASED_OUTPATIENT_CLINIC_OR_DEPARTMENT_OTHER): Payer: Self-pay

## 2012-03-21 DIAGNOSIS — Z87442 Personal history of urinary calculi: Secondary | ICD-10-CM | POA: Insufficient documentation

## 2012-03-21 DIAGNOSIS — I10 Essential (primary) hypertension: Secondary | ICD-10-CM | POA: Insufficient documentation

## 2012-03-21 DIAGNOSIS — Z79899 Other long term (current) drug therapy: Secondary | ICD-10-CM | POA: Insufficient documentation

## 2012-03-21 DIAGNOSIS — F17200 Nicotine dependence, unspecified, uncomplicated: Secondary | ICD-10-CM | POA: Insufficient documentation

## 2012-03-21 DIAGNOSIS — K8020 Calculus of gallbladder without cholecystitis without obstruction: Secondary | ICD-10-CM | POA: Insufficient documentation

## 2012-03-21 DIAGNOSIS — E119 Type 2 diabetes mellitus without complications: Secondary | ICD-10-CM | POA: Insufficient documentation

## 2012-03-21 DIAGNOSIS — R7989 Other specified abnormal findings of blood chemistry: Secondary | ICD-10-CM | POA: Insufficient documentation

## 2012-03-21 LAB — CBC WITH DIFFERENTIAL/PLATELET
Basophils Absolute: 0 10*3/uL (ref 0.0–0.1)
Eosinophils Absolute: 0.1 10*3/uL (ref 0.0–0.7)
HCT: 49.9 % (ref 39.0–52.0)
Hemoglobin: 17.3 g/dL — ABNORMAL HIGH (ref 13.0–17.0)
Lymphocytes Relative: 35 % (ref 12–46)
MCHC: 34.7 g/dL (ref 30.0–36.0)
Monocytes Relative: 11 % (ref 3–12)
Neutrophils Relative %: 52 % (ref 43–77)
RDW: 12.8 % (ref 11.5–15.5)
WBC: 6.9 10*3/uL (ref 4.0–10.5)

## 2012-03-21 LAB — COMPREHENSIVE METABOLIC PANEL
ALT: 75 U/L — ABNORMAL HIGH (ref 0–53)
Alkaline Phosphatase: 77 U/L (ref 39–117)
CO2: 29 mEq/L (ref 19–32)
GFR calc Af Amer: >90 mL/min (ref >90)
GFR calc non Af Amer: >90 mL/min (ref >90)
Glucose, Bld: 134 mg/dL — ABNORMAL HIGH (ref 70–99)
Potassium: 3.8 mEq/L (ref 3.5–5.1)
Sodium: 140 mEq/L (ref 135–145)
Total Bilirubin: 0.4 mg/dL (ref 0.3–1.2)

## 2012-03-21 LAB — LIPASE, BLOOD: Lipase: 35 U/L (ref 11–59)

## 2012-03-21 LAB — URINALYSIS, ROUTINE W REFLEX MICROSCOPIC
Bilirubin Urine: NEGATIVE
Specific Gravity, Urine: 1.024 (ref 1.005–1.030)
Urobilinogen, UA: 2 mg/dL — ABNORMAL HIGH (ref 0.0–1.0)
pH: 6 (ref 5.0–8.0)

## 2012-03-21 LAB — URINE MICROSCOPIC-ADD ON

## 2012-03-21 MED ORDER — HYDROMORPHONE HCL PF 1 MG/ML IJ SOLN
1.0000 mg | Freq: Once | INTRAMUSCULAR | Status: AC
  Administered 2012-03-21: 1 mg via INTRAVENOUS
  Filled 2012-03-21: qty 1

## 2012-03-21 MED ORDER — PROMETHAZINE HCL 25 MG/ML IJ SOLN
25.0000 mg | Freq: Four times a day (QID) | INTRAMUSCULAR | Status: DC | PRN
  Administered 2012-03-21: 25 mg via INTRAVENOUS
  Filled 2012-03-21: qty 1

## 2012-03-21 MED ORDER — ONDANSETRON HCL 4 MG/2ML IJ SOLN: 4.0000 mg | Freq: Once | INTRAMUSCULAR | Status: DC

## 2012-03-21 MED ORDER — OXYCODONE-ACETAMINOPHEN 5-325 MG PO TABS: 2.0000 | ORAL_TABLET | ORAL | Status: DC | PRN

## 2012-03-21 NOTE — ED Notes (Signed)
Pt was asked to get into a gown and given blanket for comfort.  Provider to assess patient shortly.

## 2012-03-21 NOTE — ED Notes (Signed)
Abdominal pain x1 week. States he has passed 2 kidney stones over the past 3 days.

## 2012-03-21 NOTE — ED Notes (Signed)
Patient is resting comfortably. 

## 2012-03-21 NOTE — ED Provider Notes (Signed)
History     CSN: [id number]  Arrival date & time 03/21/12  1536   First MD Initiated Contact with Patient 03/21/12 1545      Chief Complaint  Patient presents with  . Abdominal Pain    (Consider location/radiation/quality/duration/timing/severity/associated sxs/prior treatment) HPI Comments: Pt states that he has a history of surgery but he is not sure that this is the same  Patient is a 52 y.o. male presenting with abdominal pain. The history is provided by the patient. No language interpreter was used.  Abdominal Pain Pain location:  RUQ and R flank Pain quality: aching   Pain radiates to:  Does not radiate Pain severity:  Moderate Onset quality:  Unable to specify Duration:  10 days Timing:  Intermittent Progression:  Waxing and waning Chronicity:  Recurrent Relieved by:  Nothing Worsened by:  Nothing tried Ineffective treatments:  None tried Associated symptoms: no cough, no diarrhea, no fever, no nausea, no shortness of breath and no vomiting     Past Medical History  Diagnosis Date  . Kidney stones   . Kidney stones   . Hypertension   . Diabetes mellitus     History reviewed. No pertinent past surgical history.  No family history on file.  History  Substance Use Topics  . Smoking status: Current Every Day Smoker -- 1.00 packs/day    Types: Cigarettes  . Smokeless tobacco: Not on file  . Alcohol Use: No      Review of Systems  Constitutional: Negative for fever.  Respiratory: Negative for cough and shortness of breath.   Gastrointestinal: Positive for abdominal pain. Negative for nausea, vomiting and diarrhea.  Genitourinary: Negative.     Allergies  Compazine  Home Medications   Current Outpatient Rx  Name  Route  Sig  Dispense  Refill  . metFORMIN (GLUCOPHAGE) 1000 MG tablet   Oral   Take 1,000 mg by mouth 2 (two) times daily with a meal.           BP 187/109  Pulse 102  Temp(Src) 98 F (36.7 C) (Oral)  Resp 20  Wt 274 lb  (124.286 kg)  SpO2 100%  Physical Exam  Nursing note and vitals reviewed. Constitutional: He is oriented to person, place, and time. He appears well-developed.  HENT:  Head: Normocephalic and atraumatic.  Eyes: Conjunctivae and EOM are normal.  Neck: Normal range of motion. Neck supple.  Cardiovascular: Normal rate and regular rhythm.   Pulmonary/Chest: Effort normal and breath sounds normal.  Abdominal: Soft. Bowel sounds are normal. There is tenderness in the right upper quadrant and epigastric area.  Musculoskeletal: Normal range of motion.  Neurological: He is alert and oriented to person, place, and time.  Skin: Skin is warm and dry.  Psychiatric: He has a normal mood and affect.    ED Course  Procedures (including critical care time)  Labs Reviewed  URINALYSIS, ROUTINE W REFLEX MICROSCOPIC - Abnormal; Notable for the following:    Hgb urine dipstick MODERATE (*)    Urobilinogen, UA 2.0 (*)    All other components within normal limits  GLUCOSE, CAPILLARY - Abnormal; Notable for the following:    Glucose-Capillary 132 (*)    All other components within normal limits  COMPREHENSIVE METABOLIC PANEL - Abnormal; Notable for the following:    Glucose, Bld 134 (*)    AST 52 (*)    ALT 75 (*)    All other components within normal limits  CBC WITH DIFFERENTIAL - Abnormal; Notable  for the following:    RBC 5.88 (*)    Hemoglobin 17.3 (*)    Platelets 102 (*)    All other components within normal limits  URINE MICROSCOPIC-ADD ON - Abnormal; Notable for the following:    Squamous Epithelial / LPF FEW (*)    Bacteria, UA FEW (*)    All other components within normal limits  LIPASE, BLOOD   US Abdomen Complete  03/21/2012  *RADIOLOGY REPORT*  Clinical Data:  Abdominal pain.  COMPLETE ABDOMINAL ULTRASOUND  Comparison:  CT abdomen and pelvis 08/10/2011.  Findings:  Gallbladder:  A stone is identified in the neck of the gallbladder measuring 1.6 cm in diameter.  No pericholecystic  fluid or gallbladder wall thickening is identified.  Common bile duct:  Measures 0.4 cm.  Liver:  Demonstrates diffusely coarsened increased echotexture consistent with fatty infiltration.  No focal lesion or intrahepatic biliary ductal dilatation.  IVC:  Appears normal.  Pancreas:  No abnormality identified.  Spleen:  Measures 8.5 cm and appears normal.  Right Kidney:  Measures 12.1 cm.  No hydronephrosis or mass identified.  Echogenic focus projecting over the lower pole measuring 0.4 cm is compatible with a nonobstructing stone.  Left Kidney:  Measures 11.4 cm. Three punctate echogenic foci are identified which could be small stones.  No hydronephrosis or mass.  Abdominal aorta:  No aneurysm identified.  IMPRESSION:  1.  1.6 cm gallstone without evidence of cholecystitis. 2.  Likely bilateral nonobstructing renal stones. 3.  Diffuse fatty infiltration of the liver.   Original Report Authenticated By: Holley Dexter, M.D.      1. Gallstone   2. Elevated LFTs       MDM  Spoke with Dr. Daphine Deutscher with general surgery and pt to be seen in the office tomorrow and sent home with something for pain tonight:pt still mildly tender at this time       Teressa Lower, NP 03/21/12 1925

## 2012-03-21 NOTE — ED Provider Notes (Signed)
Medical screening examination/treatment/procedure(s) were conducted as a shared visit with non-physician practitioner(s) and myself.  I personally evaluated the patient during the encounter Pt with RUQ pain and also flank pain.  Feel 2 possible things going on.  Cholelithiasis and renal colic.  HD stable.  No evidence of cholecystis on imaging.  Pt to f/u with Dr. Daphine Deutscher tomorrow.  Gwyneth Sprout, MD 03/21/12 2100

## 2012-03-25 ENCOUNTER — Encounter (HOSPITAL_BASED_OUTPATIENT_CLINIC_OR_DEPARTMENT_OTHER): Payer: Self-pay | Admitting: Emergency Medicine

## 2012-03-25 ENCOUNTER — Inpatient Hospital Stay (HOSPITAL_BASED_OUTPATIENT_CLINIC_OR_DEPARTMENT_OTHER)
Admission: EM | Admit: 2012-03-25 | Discharge: 2012-03-29 | DRG: 418 | Disposition: A | Discharge status: Home / Self Care | Payer: MEDICAID | Attending: General Surgery | Admitting: General Surgery

## 2012-03-25 ENCOUNTER — Emergency Department (HOSPITAL_BASED_OUTPATIENT_CLINIC_OR_DEPARTMENT_OTHER): Payer: Self-pay

## 2012-03-25 DIAGNOSIS — K8010 Calculus of gallbladder with chronic cholecystitis without obstruction: Secondary | ICD-10-CM

## 2012-03-25 DIAGNOSIS — K7460 Unspecified cirrhosis of liver: Secondary | ICD-10-CM

## 2012-03-25 DIAGNOSIS — F17200 Nicotine dependence, unspecified, uncomplicated: Secondary | ICD-10-CM | POA: Diagnosis present

## 2012-03-25 DIAGNOSIS — Z87442 Personal history of urinary calculi: Secondary | ICD-10-CM

## 2012-03-25 DIAGNOSIS — E119 Type 2 diabetes mellitus without complications: Secondary | ICD-10-CM | POA: Diagnosis present

## 2012-03-25 DIAGNOSIS — N1330 Unspecified hydronephrosis: Secondary | ICD-10-CM | POA: Diagnosis present

## 2012-03-25 DIAGNOSIS — I10 Essential (primary) hypertension: Secondary | ICD-10-CM | POA: Diagnosis present

## 2012-03-25 DIAGNOSIS — K819 Cholecystitis, unspecified: Secondary | ICD-10-CM

## 2012-03-25 DIAGNOSIS — K7689 Other specified diseases of liver: Secondary | ICD-10-CM | POA: Diagnosis present

## 2012-03-25 DIAGNOSIS — K8020 Calculus of gallbladder without cholecystitis without obstruction: Principal | ICD-10-CM | POA: Diagnosis present

## 2012-03-25 HISTORY — DX: Headache: R51

## 2012-03-25 LAB — CBC WITH DIFFERENTIAL/PLATELET
Basophils Absolute: 0 10*3/uL (ref 0.0–0.1)
Basophils Relative: 0 % (ref 0–1)
Eosinophils Absolute: 0.2 10*3/uL (ref 0.0–0.7)
Eosinophils Relative: 2 % (ref 0–5)
HCT: 48.9 % (ref 39.0–52.0)
Hemoglobin: 17.5 g/dL — ABNORMAL HIGH (ref 13.0–17.0)
Lymphocytes Relative: 26 % (ref 12–46)
Lymphs Abs: 2.1 10*3/uL (ref 0.7–4.0)
MCH: 29.7 pg (ref 26.0–34.0)
MCHC: 35.8 g/dL (ref 30.0–36.0)
MCV: 83 fL (ref 78.0–100.0)
Monocytes Absolute: 1 10*3/uL (ref 0.1–1.0)
Monocytes Relative: 12 % (ref 3–12)
Neutro Abs: 4.8 10*3/uL (ref 1.7–7.7)
Neutrophils Relative %: 60 % (ref 43–77)
Platelets: 117 10*3/uL — ABNORMAL LOW (ref 150–400)
RBC: 5.89 MIL/uL — ABNORMAL HIGH (ref 4.22–5.81)
RDW: 12.5 % (ref 11.5–15.5)
WBC: 8.1 10*3/uL (ref 4.0–10.5)

## 2012-03-25 LAB — GLUCOSE, CAPILLARY: Glucose-Capillary: 120 mg/dL — ABNORMAL HIGH (ref 70–99)

## 2012-03-25 LAB — COMPREHENSIVE METABOLIC PANEL
ALT: 66 U/L — ABNORMAL HIGH (ref 0–53)
AST: 50 U/L — ABNORMAL HIGH (ref 0–37)
Albumin: 3.6 g/dL (ref 3.5–5.2)
Alkaline Phosphatase: 75 U/L (ref 39–117)
BUN: 14 mg/dL (ref 6–23)
CO2: 26 mEq/L (ref 19–32)
Calcium: 9.4 mg/dL (ref 8.4–10.5)
Chloride: 102 mEq/L (ref 96–112)
Creatinine, Ser: 0.8 mg/dL (ref 0.50–1.35)
GFR calc Af Amer: >90 mL/min (ref >90)
GFR calc non Af Amer: >90 mL/min (ref >90)
Glucose, Bld: 106 mg/dL — ABNORMAL HIGH (ref 70–99)
Potassium: 4.1 mEq/L (ref 3.5–5.1)
Sodium: 137 mEq/L (ref 135–145)
Total Bilirubin: 0.5 mg/dL (ref 0.3–1.2)
Total Protein: 7 g/dL (ref 6.0–8.3)

## 2012-03-25 LAB — URINALYSIS, ROUTINE W REFLEX MICROSCOPIC
Bilirubin Urine: NEGATIVE
Glucose, UA: NEGATIVE mg/dL
Hgb urine dipstick: NEGATIVE
Ketones, ur: NEGATIVE mg/dL
Nitrite: NEGATIVE
Specific Gravity, Urine: 1.021 (ref 1.005–1.030)
pH: 8 (ref 5.0–8.0)

## 2012-03-25 LAB — LIPASE, BLOOD: Lipase: 42 U/L (ref 11–59)

## 2012-03-25 MED ORDER — FENTANYL CITRATE 0.05 MG/ML IJ SOLN
100.0000 ug | Freq: Once | INTRAMUSCULAR | Status: AC
  Administered 2012-03-25: 100 ug via INTRAVENOUS
  Filled 2012-03-25: qty 2

## 2012-03-25 MED ORDER — SODIUM CHLORIDE 0.9 % IV BOLUS (SEPSIS)
1000.0000 mL | Freq: Once | INTRAVENOUS | Status: AC
  Administered 2012-03-25: 1000 mL via INTRAVENOUS

## 2012-03-25 MED ORDER — SODIUM CHLORIDE 0.9 % IV SOLN
INTRAVENOUS | Status: DC
  Administered 2012-03-25 – 2012-03-26 (×2): via INTRAVENOUS

## 2012-03-25 MED ORDER — ENOXAPARIN SODIUM 40 MG/0.4ML ~~LOC~~ SOLN
40.0000 mg | Freq: Every day | SUBCUTANEOUS | Status: DC
  Administered 2012-03-26: 40 mg via SUBCUTANEOUS
  Filled 2012-03-25 (×3): qty 0.4

## 2012-03-25 MED ORDER — MORPHINE SULFATE 2 MG/ML IJ SOLN
2.0000 mg | INTRAMUSCULAR | Status: DC | PRN
  Administered 2012-03-25 – 2012-03-28 (×10): 2 mg via INTRAVENOUS
  Filled 2012-03-25 (×10): qty 1

## 2012-03-25 MED ORDER — HYDROMORPHONE HCL PF 1 MG/ML IJ SOLN
1.0000 mg | Freq: Once | INTRAMUSCULAR | Status: AC
  Administered 2012-03-25: 1 mg via INTRAVENOUS
  Filled 2012-03-25: qty 1

## 2012-03-25 MED ORDER — ONDANSETRON HCL 4 MG/2ML IJ SOLN: 4.0000 mg | Freq: Four times a day (QID) | INTRAMUSCULAR | Status: DC | PRN

## 2012-03-25 MED ORDER — ONDANSETRON HCL 4 MG/2ML IJ SOLN
4.0000 mg | Freq: Once | INTRAMUSCULAR | Status: AC
  Administered 2012-03-25: 4 mg via INTRAVENOUS
  Filled 2012-03-25: qty 2

## 2012-03-25 NOTE — H&P (Signed)
Michael Carroll is an 52 y.o. male.   Chief Complaint: RUQ pain HPI: states that he has had intermittent ruq pain for the past month getting more frequent.  Severe pain after eating for the last week.  Associated with nausea and vomiting.  Unable to sleep for the past 2 nights.  Denies that this pain is similar to his kidney stone pain.  Past Medical History  Diagnosis Date  . Kidney stones   . Kidney stones   . Hypertension   . Diabetes mellitus     History reviewed. No pertinent past surgical history.  History reviewed. No pertinent family history. Social History:  reports that he has been smoking Cigarettes.  He has been smoking about 1.00 pack per day. He does not have any smokeless tobacco history on file. He reports that he does not drink alcohol or use illicit drugs.  Allergies:  Allergies  Allergen Reactions  . Compazine Itching and Anxiety    Altered mental status     (Not in a hospital admission)  Results for orders placed during the hospital encounter of 03/25/12 (from the past 48 hour(s))  URINALYSIS, ROUTINE W REFLEX MICROSCOPIC     Status: Abnormal   Collection Time    03/25/12  2:46 PM      Result Value Range   Color, Urine AMBER (*) YELLOW   Comment: BIOCHEMICALS MAY BE AFFECTED BY COLOR   APPearance CLEAR  CLEAR   Specific Gravity, Urine 1.021  1.005 - 1.030   pH 8.0  5.0 - 8.0   Glucose, UA NEGATIVE  NEGATIVE mg/dL   Hgb urine dipstick NEGATIVE  NEGATIVE   Bilirubin Urine NEGATIVE  NEGATIVE   Ketones, ur NEGATIVE  NEGATIVE mg/dL   Protein, ur NEGATIVE  NEGATIVE mg/dL   Urobilinogen, UA 1.0  0.0 - 1.0 mg/dL   Nitrite NEGATIVE  NEGATIVE   Leukocytes, UA NEGATIVE  NEGATIVE   Comment: MICROSCOPIC NOT DONE ON URINES WITH NEGATIVE PROTEIN, BLOOD, LEUKOCYTES, NITRITE, OR GLUCOSE <1000 mg/dL.  COMPREHENSIVE METABOLIC PANEL     Status: Abnormal   Collection Time    03/25/12  4:02 PM      Result Value Range   Sodium 137  135 - 145 mEq/L   Potassium 4.1   3.5 - 5.1 mEq/L   Chloride 102  96 - 112 mEq/L   CO2 26  19 - 32 mEq/L   Glucose, Bld 106 (*) 70 - 99 mg/dL   BUN 14  6 - 23 mg/dL   Creatinine, Ser 1.61  0.50 - 1.35 mg/dL   Calcium 9.4  8.4 - 09.6 mg/dL   Total Protein 7.0  6.0 - 8.3 g/dL   Albumin 3.6  3.5 - 5.2 g/dL   AST 50 (*) 0 - 37 U/L   ALT 66 (*) 0 - 53 U/L   Alkaline Phosphatase 75  39 - 117 U/L   Total Bilirubin 0.5  0.3 - 1.2 mg/dL   GFR calc non Af Amer >90  >90 mL/min   GFR calc Af Amer >90  >90 mL/min   Comment:            The eGFR has been calculated     using the CKD EPI equation.     This calculation has not been     validated in all clinical     situations.     eGFR's persistently     <90 mL/min signify     possible Chronic Kidney Disease.  CBC WITH DIFFERENTIAL     Status: Abnormal   Collection Time    03/25/12  4:02 PM      Result Value Range   WBC 8.1  4.0 - 10.5 K/uL   RBC 5.89 (*) 4.22 - 5.81 MIL/uL   Hemoglobin 17.5 (*) 13.0 - 17.0 g/dL   HCT 16.1  09.6 - 04.5 %   MCV 83.0  78.0 - 100.0 fL   MCH 29.7  26.0 - 34.0 pg   MCHC 35.8  30.0 - 36.0 g/dL   RDW 40.9  81.1 - 91.4 %   Platelets 117 (*) 150 - 400 K/uL   Comment: PLATELET COUNT CONFIRMED BY SMEAR   Neutrophils Relative 60  43 - 77 %   Neutro Abs 4.8  1.7 - 7.7 K/uL   Lymphocytes Relative 26  12 - 46 %   Lymphs Abs 2.1  0.7 - 4.0 K/uL   Monocytes Relative 12  3 - 12 %   Monocytes Absolute 1.0  0.1 - 1.0 K/uL   Eosinophils Relative 2  0 - 5 %   Eosinophils Absolute 0.2  0.0 - 0.7 K/uL   Basophils Relative 0  0 - 1 %   Basophils Absolute 0.0  0.0 - 0.1 K/uL  LIPASE, BLOOD     Status: None   Collection Time    03/25/12  4:02 PM      Result Value Range   Lipase 42  11 - 59 U/L   US Abdomen Limited Ruq  03/25/2012  *RADIOLOGY REPORT*  Clinical Data:  History of gallstones.  Rule out cholecystitis.  LIMITED ABDOMINAL ULTRASOUND - RIGHT UPPER QUADRANT  Comparison:  03/21/2012  Findings:  Gallbladder:  1.7 cm gallstone.  No wall thickening or  pericholecystic fluid.  The technologist describes tenderness with gallbladder palpation.  Common bile duct:  Upper normal to minimally dilated, maximally 7 mm.  Liver:  Within normal limits.  Incidental imaging of the right kidney demonstrates mild hydronephrosis.  Interpolar 6 mm stone suspected.  IMPRESSION: Cholelithiasis.  Tenderness with gallbladder palpation.  Cannot exclude acute cholecystitis.  Mild right-sided hydronephrosis.  Cannot exclude distal obstructing stone.  Minimally dilated common duct.  Especially if the bilirubin level is elevated, contrast enhanced CT or MRCP should be considered.  On this exam, the right-sided hydronephrosis can also be evaluated.                    Original Report Authenticated By: Jeronimo Greaves, M.D.     Review of Systems  Constitutional: Negative for fever, chills and weight loss.  Respiratory: Negative for cough and shortness of breath.   Cardiovascular: Negative for chest pain.  Gastrointestinal: Positive for nausea, vomiting, abdominal pain and diarrhea. Negative for heartburn.  Genitourinary: Positive for dysuria.  Skin: Negative for rash.  Neurological: Negative for headaches.  Endo/Heme/Allergies: Does not bruise/bleed easily.    Blood pressure 161/90, pulse 59, temperature 98.6 F (37 C), temperature source Oral, resp. rate 16, SpO2 99.00%. Physical Exam  Constitutional: He is oriented to person, place, and time. He appears well-developed and well-nourished. No distress.  HENT:  Head: Normocephalic and atraumatic.  Eyes: Conjunctivae are normal. Pupils are equal, round, and reactive to light.  Neck: Normal range of motion.  Cardiovascular: Normal rate, regular rhythm and normal heart sounds.   Respiratory: Effort normal and breath sounds normal.  GI: Soft. He exhibits no distension. There is tenderness.  RUQ  Musculoskeletal: Normal range of motion.  Neurological: He  is alert and oriented to person, place, and time.  Skin: Skin is warm  and dry.     Assessment/Plan Symptomatic cholelithiasis Admit to floor. NPO p MN OR for lap chole and IOC in AM IV hydration overnight Wbc normal- no need for antibiotics lov sq and scds for dvt prophylaxis  THOMAS, ALICIA C. 03/25/2012, 11:04 PM

## 2012-03-25 NOTE — ED Notes (Signed)
Called report to charge nurse in the ER at [facility]

## 2012-03-25 NOTE — ED Notes (Signed)
Pt having right flank pain and right upper abdominal pain for several days.  Pt has gallstones.  Unable to follow up due to stones.  Pt is having N/V/D and worsening of pain since visit.  Some fever and chills.

## 2012-03-25 NOTE — ED Notes (Signed)
[id number]<JY> Expected date:<BR> Expected time:<BR> Means of arrival:<BR> Comments:<BR> ems

## 2012-03-26 ENCOUNTER — Inpatient Hospital Stay (HOSPITAL_COMMUNITY): Payer: Self-pay | Admitting: Anesthesiology

## 2012-03-26 ENCOUNTER — Encounter (HOSPITAL_COMMUNITY): Admission: EM | Disposition: A | Discharge status: Home / Self Care | Payer: Self-pay | Source: Home / Self Care

## 2012-03-26 ENCOUNTER — Encounter (HOSPITAL_COMMUNITY): Payer: Self-pay | Admitting: Anesthesiology

## 2012-03-26 ENCOUNTER — Inpatient Hospital Stay (HOSPITAL_COMMUNITY): Payer: Self-pay

## 2012-03-26 ENCOUNTER — Encounter (HOSPITAL_COMMUNITY): Payer: Self-pay

## 2012-03-26 DIAGNOSIS — K8010 Calculus of gallbladder with chronic cholecystitis without obstruction: Secondary | ICD-10-CM | POA: Diagnosis present

## 2012-03-26 HISTORY — PX: LIVER BIOPSY: SHX301

## 2012-03-26 HISTORY — PX: CHOLECYSTECTOMY: SHX55

## 2012-03-26 LAB — SURGICAL PCR SCREEN: MRSA, PCR: POSITIVE — AB

## 2012-03-26 LAB — GLUCOSE, CAPILLARY
Glucose-Capillary: 145 mg/dL — ABNORMAL HIGH (ref 70–99)
Glucose-Capillary: 114 mg/dL — ABNORMAL HIGH (ref 70–99)

## 2012-03-26 SURGERY — LAPAROSCOPIC CHOLECYSTECTOMY
Anesthesia: General | Site: Abdomen | Wound class: Contaminated

## 2012-03-26 MED ORDER — ALBUTEROL SULFATE HFA 108 (90 BASE) MCG/ACT IN AERS
INHALATION_SPRAY | RESPIRATORY_TRACT | Status: DC | PRN
  Administered 2012-03-26: 5 via RESPIRATORY_TRACT

## 2012-03-26 MED ORDER — HYDROMORPHONE HCL PF 1 MG/ML IJ SOLN
0.2500 mg | INTRAMUSCULAR | Status: DC | PRN
  Administered 2012-03-26 (×2): 0.5 mg via INTRAVENOUS

## 2012-03-26 MED ORDER — LACTATED RINGERS IV SOLN
INTRAVENOUS | Status: DC | PRN
  Administered 2012-03-26: 11:00:00 via INTRAVENOUS

## 2012-03-26 MED ORDER — PROPOFOL 10 MG/ML IV BOLUS
INTRAVENOUS | Status: DC | PRN
  Administered 2012-03-26: 200 mg via INTRAVENOUS

## 2012-03-26 MED ORDER — NEOSTIGMINE METHYLSULFATE 1 MG/ML IJ SOLN
INTRAMUSCULAR | Status: DC | PRN
  Administered 2012-03-26: 5 mg via INTRAVENOUS

## 2012-03-26 MED ORDER — HYDROMORPHONE HCL PF 1 MG/ML IJ SOLN
INTRAMUSCULAR | Status: AC
  Filled 2012-03-26: qty 2

## 2012-03-26 MED ORDER — BUPIVACAINE-EPINEPHRINE 0.25% -1:200000 IJ SOLN
INTRAMUSCULAR | Status: DC | PRN
  Administered 2012-03-26: 20 mL

## 2012-03-26 MED ORDER — HYDROMORPHONE HCL PF 1 MG/ML IJ SOLN: 0.2500 mg | INTRAMUSCULAR | Status: DC | PRN

## 2012-03-26 MED ORDER — MIDAZOLAM HCL 5 MG/5ML IJ SOLN
INTRAMUSCULAR | Status: DC | PRN
  Administered 2012-03-26: 2 mg via INTRAVENOUS

## 2012-03-26 MED ORDER — MORPHINE SULFATE 10 MG/ML IJ SOLN
INTRAMUSCULAR | Status: AC
  Administered 2012-03-26: 6 mg
  Filled 2012-03-26: qty 1

## 2012-03-26 MED ORDER — OXYCODONE HCL 5 MG PO TABS
5.0000 mg | ORAL_TABLET | ORAL | Status: DC | PRN
  Administered 2012-03-26: 5 mg via ORAL
  Administered 2012-03-26 – 2012-03-27 (×3): 10 mg via ORAL
  Filled 2012-03-26 (×3): qty 2
  Filled 2012-03-26: qty 1

## 2012-03-26 MED ORDER — HYDROCHLOROTHIAZIDE 25 MG PO TABS
25.0000 mg | ORAL_TABLET | Freq: Every morning | ORAL | Status: DC
  Filled 2012-03-26 (×4): qty 1

## 2012-03-26 MED ORDER — HYDROMORPHONE HCL PF 1 MG/ML IJ SOLN
0.2500 mg | INTRAMUSCULAR | Status: DC | PRN
  Administered 2012-03-26 (×4): 0.5 mg via INTRAVENOUS

## 2012-03-26 MED ORDER — ONDANSETRON HCL 4 MG/2ML IJ SOLN
INTRAMUSCULAR | Status: DC | PRN
  Administered 2012-03-26: 4 mg via INTRAVENOUS

## 2012-03-26 MED ORDER — DEXTROSE 5 % IV SOLN
2.0000 g | INTRAVENOUS | Status: DC | PRN
  Administered 2012-03-26: 2 g via INTRAVENOUS

## 2012-03-26 MED ORDER — SODIUM CHLORIDE 0.9 % IV SOLN
INTRAVENOUS | Status: AC
  Filled 2012-03-26: qty 100

## 2012-03-26 MED ORDER — VANCOMYCIN HCL IN DEXTROSE 1-5 GM/200ML-% IV SOLN
INTRAVENOUS | Status: AC
  Filled 2012-03-26: qty 200

## 2012-03-26 MED ORDER — CEFOXITIN SODIUM-DEXTROSE 1-4 GM-% IV SOLR (PREMIX)
INTRAVENOUS | Status: AC
  Filled 2012-03-26: qty 100

## 2012-03-26 MED ORDER — VANCOMYCIN HCL 1000 MG IV SOLR
1000.0000 mg | INTRAVENOUS | Status: DC | PRN
  Administered 2012-03-26: 1000 mg via INTRAVENOUS

## 2012-03-26 MED ORDER — GLYCOPYRROLATE 0.2 MG/ML IJ SOLN
INTRAMUSCULAR | Status: DC | PRN
  Administered 2012-03-26: 0.6 mg via INTRAVENOUS

## 2012-03-26 MED ORDER — VANCOMYCIN HCL 1000 MG IV SOLR
500.0000 mg | INTRAVENOUS | Status: DC | PRN
  Administered 2012-03-26: 500 mg via INTRAVENOUS

## 2012-03-26 MED ORDER — BUPIVACAINE-EPINEPHRINE PF 0.25-1:200000 % IJ SOLN
INTRAMUSCULAR | Status: AC
  Filled 2012-03-26: qty 30

## 2012-03-26 MED ORDER — IOHEXOL 300 MG/ML  SOLN
INTRAMUSCULAR | Status: AC
  Filled 2012-03-26: qty 1

## 2012-03-26 MED ORDER — LACTATED RINGERS IR SOLN
Status: DC | PRN
  Administered 2012-03-26: 1

## 2012-03-26 MED ORDER — VANCOMYCIN HCL 500 MG IV SOLR
INTRAVENOUS | Status: AC
  Filled 2012-03-26: qty 500

## 2012-03-26 MED ORDER — SODIUM CHLORIDE 0.9 % IV SOLN
INTRAVENOUS | Status: DC
  Administered 2012-03-26 – 2012-03-29 (×6): via INTRAVENOUS

## 2012-03-26 MED ORDER — METOCLOPRAMIDE HCL 5 MG/ML IJ SOLN
INTRAMUSCULAR | Status: DC | PRN
  Administered 2012-03-26: 10 mg via INTRAVENOUS

## 2012-03-26 MED ORDER — HYDROMORPHONE HCL PF 1 MG/ML IJ SOLN
INTRAMUSCULAR | Status: DC | PRN
  Administered 2012-03-26 (×2): 1 mg via INTRAVENOUS

## 2012-03-26 MED ORDER — MORPHINE SULFATE 4 MG/ML IJ SOLN: 6.0000 mg | Freq: Once | INTRAMUSCULAR | Status: DC

## 2012-03-26 MED ORDER — FENTANYL CITRATE 0.05 MG/ML IJ SOLN
INTRAMUSCULAR | Status: DC | PRN
  Administered 2012-03-26 (×2): 50 ug via INTRAVENOUS
  Administered 2012-03-26: 100 ug via INTRAVENOUS
  Administered 2012-03-26: 75 ug via INTRAVENOUS
  Administered 2012-03-26: 25 ug via INTRAVENOUS

## 2012-03-26 MED ORDER — ROCURONIUM BROMIDE 100 MG/10ML IV SOLN
INTRAVENOUS | Status: DC | PRN
  Administered 2012-03-26: 20 mg via INTRAVENOUS
  Administered 2012-03-26: 50 mg via INTRAVENOUS

## 2012-03-26 SURGICAL SUPPLY — 47 items
ADH SKN CLS APL DERMABOND .7 (GAUZE/BANDAGES/DRESSINGS) ×2
APPLIER CLIP 5 13 M/L LIGAMAX5 (MISCELLANEOUS) ×3
APR CLP MED LRG 5 ANG JAW (MISCELLANEOUS) ×2
BAG SPEC RTRVL LRG 6X4 10 (ENDOMECHANICALS) ×2
CABLE HIGH FREQUENCY MONO STRZ (ELECTRODE) ×3 IMPLANT
CANISTER SUCTION 2500CC (MISCELLANEOUS) ×3 IMPLANT
CATH REDDICK CHOLANGI 4FR 50CM (CATHETERS) ×3 IMPLANT
CHLORAPREP W/TINT 26ML (MISCELLANEOUS) ×3 IMPLANT
CLIP APPLIE 5 13 M/L LIGAMAX5 (MISCELLANEOUS) ×2 IMPLANT
CLOTH BEACON ORANGE TIMEOUT ST (SAFETY) ×3 IMPLANT
COVER MAYO STAND STRL (DRAPES) ×3 IMPLANT
COVER SURGICAL LIGHT HANDLE (MISCELLANEOUS) ×3 IMPLANT
DECANTER SPIKE VIAL GLASS SM (MISCELLANEOUS) ×3 IMPLANT
DERMABOND ADVANCED (GAUZE/BANDAGES/DRESSINGS) ×1
DERMABOND ADVANCED .7 DNX12 (GAUZE/BANDAGES/DRESSINGS) ×2 IMPLANT
DRAPE C-ARM 42X72 X-RAY (DRAPES) ×3 IMPLANT
DRAPE LAPAROSCOPIC ABDOMINAL (DRAPES) ×3 IMPLANT
DRAPE LG THREE QUARTER DISP (DRAPES) ×6 IMPLANT
DRAPE UTILITY W/TAPE 26X15 (DRAPES) ×3 IMPLANT
DRAPE UTILITY XL STRL (DRAPES) ×3 IMPLANT
DRSG TEGADERM 2-3/8X2-3/4 SM (GAUZE/BANDAGES/DRESSINGS) ×3 IMPLANT
ELECT REM PT RETURN 9FT ADLT (ELECTROSURGICAL) ×3
ELECTRODE REM PT RTRN 9FT ADLT (ELECTROSURGICAL) ×2 IMPLANT
GLOVE BIO SURGEON STRL SZ 6.5 (GLOVE) ×3 IMPLANT
GLOVE BIOGEL PI IND STRL 7.0 (GLOVE) ×2 IMPLANT
GLOVE BIOGEL PI INDICATOR 7.0 (GLOVE) ×1
GOWN STRL NON-REIN LRG LVL3 (GOWN DISPOSABLE) ×3 IMPLANT
GOWN STRL REIN 2XL LVL4 (GOWN DISPOSABLE) ×3 IMPLANT
GOWN STRL REIN XL XLG (GOWN DISPOSABLE) ×6 IMPLANT
IV CATH 14GX2 1/4 (CATHETERS) ×3 IMPLANT
KIT BASIN OR (CUSTOM PROCEDURE TRAY) ×3 IMPLANT
NEEDLE BIOPSY 14X6 SOFT TISS (NEEDLE) ×3 IMPLANT
NS IRRIG 1000ML POUR BTL (IV SOLUTION) ×3 IMPLANT
PAD TELFA 2X3 NADH STRL (GAUZE/BANDAGES/DRESSINGS) ×3 IMPLANT
POUCH SPECIMEN RETRIEVAL 10MM (ENDOMECHANICALS) ×3 IMPLANT
SCISSORS ENDO CVD 5DCS (MISCELLANEOUS) ×3 IMPLANT
SET IRRIG TUBING LAPAROSCOPIC (IRRIGATION / IRRIGATOR) ×3 IMPLANT
SOLUTION ANTI FOG 6CC (MISCELLANEOUS) ×3 IMPLANT
SUT VIC AB 2-0 SH 18 (SUTURE) ×3 IMPLANT
SUT VIC AB 3-0 SH 8-18 (SUTURE) ×3 IMPLANT
SUT VICRYL RAPIDE 4/0 PS 2 (SUTURE) ×3 IMPLANT
TAPE STRIPS DRAPE STRL (GAUZE/BANDAGES/DRESSINGS) ×3 IMPLANT
TOWEL OR 17X26 10 PK STRL BLUE (TOWEL DISPOSABLE) ×3 IMPLANT
TRAY LAP CHOLE (CUSTOM PROCEDURE TRAY) ×3 IMPLANT
TROCAR BLADELESS OPT 5 75 (ENDOMECHANICALS) ×9 IMPLANT
TROCAR XCEL BLUNT TIP 100MML (ENDOMECHANICALS) ×3 IMPLANT
TUBING INSUFFLATION 10FT LAP (TUBING) ×3 IMPLANT

## 2012-03-26 NOTE — Anesthesia Postprocedure Evaluation (Signed)
  Anesthesia Post-op Note  Patient: Michael Carroll  Procedure(s) Performed: Procedure(s): LAPAROSCOPIC CHOLECYSTECTOMY (N/A) LIVER BIOPSY (N/A)  Patient is awake and responsive. Pain and nausea are reasonably well controlled. Vital signs are stable and clinically acceptable. Oxygen saturation is clinically acceptable. There are no apparent anesthetic complications at this time. Patient is ready for discharge.

## 2012-03-26 NOTE — ED Provider Notes (Signed)
History/physical exam/procedure(s) were performed by non-physician practitioner and as supervising physician I was immediately available for consultation/collaboration. I have reviewed all notes and am in agreement with care and plan.   Hilario Quarry, MD 03/26/12 [phone]

## 2012-03-26 NOTE — Op Note (Signed)
03/25/2012 - 03/26/2012  11:19 AM  PATIENT:  Michael Carroll  52 y.o. male  Patient has no care team.  PRE-OPERATIVE DIAGNOSIS:  Symptomatic cholelithiasis  POST-OPERATIVE DIAGNOSIS:  Symptomatic cholelithiasis, nodular liver  PROCEDURE:  Procedure(s): LAPAROSCOPIC CHOLECYSTECTOMY LIVER BIOPSY  SURGEON:  Surgeon(s): Romie Levee, MD Ardeth Sportsman, MD  ASSISTANT: Michaell Cowing   ANESTHESIA:   general  EBL:  Total I/O In: 1000 [I.V.:1000] Out: 350 [Urine:350]  Delay start of Pharmacological VTE agent (>24hrs) due to surgical blood loss or risk of bleeding:  no  DRAINS: none   SPECIMEN:  Source of Specimen:  Gallbladder, liver biopsy  DISPOSITION OF SPECIMEN:  PATHOLOGY  COUNTS:  YES  PLAN OF CARE: Patient admitted  PATIENT DISPOSITION:  PACU - hemodynamically stable.  INDICATION: This is a 52 y.o. M who presented to the Ed for the 2nd time in the last week with severe RUQ pain and nausea.  He is unable to tolerate food.  It was decided to remove his gallbladder due to his severe pain and gallstones.    OR FINDINGS: dilated gallbladder, nodular liver  DESCRIPTION:   The patient was identified & brought into the operating room. The patient was positioned supine with arms tucked. SCDs were active during the entire case. The patient underwent general anesthesia without any difficulty.  The abdomen was prepped and draped in a sterile fashion. A Surgical Timeout was performed and confirmed our plan.  We positioned the patient in reverse [place] & right side up.  I placed a Hassan laparoscopic port through the umbilicus using open entry technique.  Entry was clean. There were no adhesions to the anterior abdominal wall supraumbilically.  We induced carbon dioxide insufflation. Camera inspection revealed no injury.   I proceeded to continue with laparoscopic technique. I placed a #5 port in mid subcostal region, another 5mm port in the right flank near the anterior  axillary line, and a 5mm port in the left subxiphoid region obliquely within the falciform ligament.  I turned attention to the right upper quadrant.   The gallbladder fundus was distended.  It had to aspirated before I was able to elevate it cephalad.  The liver was nodular and stiff.  It was difficult to elevate. I used cautery and blunt dissection to free the peritoneal coverings between the gallbladder and the liver on the posteriolateral and anteriomedial walls.   I used careful blunt and cautery dissection with a maryland dissector to help get a good critical view of the cystic artery and cystic duct. I did further dissection to free a few centimeters of the  gallbladder off the liver bed to get a good critical view of the infundibulum and cystic duct. I mobilized the cystic artery.  I skeletonized the cystic duct.  After getting a good 360 view, I decided not to perform a cholangiogram.  I placed a clip on the infundibulum.  I placed clips on the cystic duct x3.  I completed cystic duct transection.   I placed clips on the cystic artery x3 with 2 proximally.  I ligated the cystic artery using scissors. I freed the gallbladder from its remaining attachments to the liver. I ensured hemostasis on the gallbladder fossa of the liver and elsewhere. I inspected the rest of the abdomen & detected no injury nor bleeding elsewhere.  I irrigated the RUQ with normal saline.  I removed the gallbladder through the umbilical port site.  I then placed a true cut biopsy needle in the  abdominal wall.  Biopsies were taken x2.  These were also sent to pathology.   Hemostasis was achieved with electrocautery.  I then removed the ports and closed the umbilical fascia using 0 Vicryl stitches x2.   I closed the skin using 3-0 vicryl stitch.  Sterile dressings were applied. The patient was extubated & arrived in the PACU in stable condition.  I had discussed postoperative care with the patient in the holding area.   I will  discuss the operative findings and postoperative goals / instructions with the patient's family.  Instructions are written in the chart as well.

## 2012-03-26 NOTE — Progress Notes (Signed)
<principal problem not specified>  Subjective: Pt did well overnight.  No nausea.    Objective: Vital signs in last 24 hours: Temp:  [98.1 F (36.7 C)-98.6 F (37 C)] 98.2 F (36.8 C) (03/09 0900) Pulse Rate:  [58-69] 63 (03/09 0900) Resp:  [16-18] 16 (03/09 0900) BP: (129-165)/(75-101) 146/91 mmHg (03/09 0900) SpO2:  [96 %-99 %] 98 % (03/09 0900) Weight:  [278 lb (126.1 kg)] 278 lb (126.1 kg) (03/08 2355) Last BM Date: 03/26/12  Intake/Output from previous day: 03/08 0701 - 03/09 0700 In: 762.5 [I.V.:762.5] Out: 300 [Urine:300] Intake/Output this shift: Total I/O In: -  Out: 350 [Urine:350]  General appearance: alert and cooperative GI: soft, non-tender; bowel sounds normal; no masses,  no organomegaly  Lab Results:  Results for orders placed during the hospital encounter of 03/25/12 (from the past 24 hour(s))  URINALYSIS, ROUTINE W REFLEX MICROSCOPIC     Status: Abnormal   Collection Time    03/25/12  2:46 PM      Result Value Range   Color, Urine AMBER (*) YELLOW   APPearance CLEAR  CLEAR   Specific Gravity, Urine 1.021  1.005 - 1.030   pH 8.0  5.0 - 8.0   Glucose, UA NEGATIVE  NEGATIVE mg/dL   Hgb urine dipstick NEGATIVE  NEGATIVE   Bilirubin Urine NEGATIVE  NEGATIVE   Ketones, ur NEGATIVE  NEGATIVE mg/dL   Protein, ur NEGATIVE  NEGATIVE mg/dL   Urobilinogen, UA 1.0  0.0 - 1.0 mg/dL   Nitrite NEGATIVE  NEGATIVE   Leukocytes, UA NEGATIVE  NEGATIVE  COMPREHENSIVE METABOLIC PANEL     Status: Abnormal   Collection Time    03/25/12  4:02 PM      Result Value Range   Sodium 137  135 - 145 mEq/L   Potassium 4.1  3.5 - 5.1 mEq/L   Chloride 102  96 - 112 mEq/L   CO2 26  19 - 32 mEq/L   Glucose, Bld 106 (*) 70 - 99 mg/dL   BUN 14  6 - 23 mg/dL   Creatinine, Ser 1.91  0.50 - 1.35 mg/dL   Calcium 9.4  8.4 - 47.8 mg/dL   Total Protein 7.0  6.0 - 8.3 g/dL   Albumin 3.6  3.5 - 5.2 g/dL   AST 50 (*) 0 - 37 U/L   ALT 66 (*) 0 - 53 U/L   Alkaline Phosphatase 75  39 -  117 U/L   Total Bilirubin 0.5  0.3 - 1.2 mg/dL   GFR calc non Af Amer >90  >90 mL/min   GFR calc Af Amer >90  >90 mL/min  CBC WITH DIFFERENTIAL     Status: Abnormal   Collection Time    03/25/12  4:02 PM      Result Value Range   WBC 8.1  4.0 - 10.5 K/uL   RBC 5.89 (*) 4.22 - 5.81 MIL/uL   Hemoglobin 17.5 (*) 13.0 - 17.0 g/dL   HCT 29.5  62.1 - 30.8 %   MCV 83.0  78.0 - 100.0 fL   MCH 29.7  26.0 - 34.0 pg   MCHC 35.8  30.0 - 36.0 g/dL   RDW 65.7  84.6 - 96.2 %   Platelets 117 (*) 150 - 400 K/uL   Neutrophils Relative 60  43 - 77 %   Neutro Abs 4.8  1.7 - 7.7 K/uL   Lymphocytes Relative 26  12 - 46 %   Lymphs Abs 2.1  0.7 - 4.0  K/uL   Monocytes Relative 12  3 - 12 %   Monocytes Absolute 1.0  0.1 - 1.0 K/uL   Eosinophils Relative 2  0 - 5 %   Eosinophils Absolute 0.2  0.0 - 0.7 K/uL   Basophils Relative 0  0 - 1 %   Basophils Absolute 0.0  0.0 - 0.1 K/uL  LIPASE, BLOOD     Status: None   Collection Time    03/25/12  4:02 PM      Result Value Range   Lipase 42  11 - 59 U/L  GLUCOSE, CAPILLARY     Status: Abnormal   Collection Time    03/25/12 11:36 PM      Result Value Range   Glucose-Capillary 120 (*) 70 - 99 mg/dL   Comment 1 Documented in Chart     Comment 2 Notify RN    GLUCOSE, CAPILLARY     Status: Abnormal   Collection Time    03/26/12  3:27 AM      Result Value Range   Glucose-Capillary 145 (*) 70 - 99 mg/dL  GLUCOSE, CAPILLARY     Status: Abnormal   Collection Time    03/26/12  8:21 AM      Result Value Range   Glucose-Capillary 114 (*) 70 - 99 mg/dL     Studies/Results Radiology     MEDS, Scheduled . enoxaparin (LOVENOX) injection  40 mg Subcutaneous QHS  .  morphine injection  6 mg Intravenous Once     Assessment: <principal problem not specified> Cholelithiasis, chronic cholecystitis  Plan: OR today for l/s chole and ioc The anatomy & physiology of hepatobiliary & pancreatic function was discussed.  The pathophysiology of gallbladder  dysfunction was discussed.  Natural history risks without surgery was discussed.   I feel the risks of no intervention will lead to serious problems that outweigh the operative risks; therefore, I recommended cholecystectomy to remove the pathology.  I explained laparoscopic techniques with possible need for an open approach.  Probable cholangiogram to evaluate the bilary tract was explained as well.    Risks such as bleeding, infection, abscess, leak, injury to other organs, need for further treatment, heart attack, death, and other risks were discussed.  I noted a good likelihood this will help address the problem.  Possibility that this will not correct all abdominal symptoms was explained.  Goals of post-operative recovery were discussed as well.  We will work to minimize complications.  An educational handout further explaining the pathology and treatment options was given as well.  Questions were answered.  The patient expresses understanding & wishes to proceed with surgery.   LOS: 1 day    Michael Panda, MD [facility], [place] [phone]   03/26/2012 9:52 AM

## 2012-03-26 NOTE — Progress Notes (Signed)
Nasal swab for mrsa positive  results called to me. Surgery -andrea notified.

## 2012-03-26 NOTE — Preoperative (Signed)
Beta Blockers   Reason not to administer Beta Blockers:Not Applicable, not on home BB 

## 2012-03-26 NOTE — Transfer of Care (Signed)
Immediate Anesthesia Transfer of Care Note  Patient: Michael Carroll  Procedure(s) Performed: Procedure(s): LAPAROSCOPIC CHOLECYSTECTOMY (N/A) LIVER BIOPSY (N/A)  Patient Location: PACU  Anesthesia Type:General  Level of Consciousness: awake, patient cooperative and responds to stimulation, drowsy, ventilating well  Airway & Oxygen Therapy: Patient Spontanous Breathing and Patient connected to face mask oxygen  Post-op Assessment: Report given to PACU RN, Post -op Vital signs reviewed and stable and Patient moving all extremities X 4  Post vital signs: Reviewed and stable  Complications: No apparent anesthesia complications

## 2012-03-26 NOTE — Anesthesia Preprocedure Evaluation (Signed)
Anesthesia Evaluation  Patient identified by MRN, date of birth, ID band Patient awake    Reviewed: Allergy & Precautions, H&P , Patient's Chart, lab work & pertinent test results, reviewed documented beta blocker date and time   Airway Mallampati: II TM Distance: >3 FB Neck ROM: full    Dental no notable dental hx.    Pulmonary Current Smoker,  breath sounds clear to auscultation  Pulmonary exam normal       Cardiovascular hypertension (Off all meds with consult of MD. Not greatly hypertensive), Rhythm:regular Rate:Normal     Neuro/Psych    GI/Hepatic   Endo/Other  diabetes, Well ControlledMorbid obesity  Renal/GU      Musculoskeletal   Abdominal   Peds  Hematology   Anesthesia Other Findings Denies OSA sx or snoring severely  Reproductive/Obstetrics                           Anesthesia Physical Anesthesia Plan  ASA: III  Anesthesia Plan: General   Post-op Pain Management:    Induction: Intravenous  Airway Management Planned: Oral ETT  Additional Equipment:   Intra-op Plan:   Post-operative Plan:   Informed Consent: I have reviewed the patients History and Physical, chart, labs and discussed the procedure including the risks, benefits and alternatives for the proposed anesthesia with the patient or authorized representative who has indicated his/her understanding and acceptance.   Dental Advisory Given and Dental advisory given  Plan Discussed with: CRNA and Surgeon  Anesthesia Plan Comments: (  Discussed  general anesthesia, including possible nausea, instrumentation of airway, sore throat,pulmonary aspiration, etc. I asked if the were any outstanding questions, or  concerns before we proceeded. )        Anesthesia Quick Evaluation

## 2012-03-26 NOTE — ED Provider Notes (Signed)
History     CSN: [id number]  Arrival date & time 03/25/12  1338   First MD Initiated Contact with Patient 03/25/12 1541      Chief Complaint  Patient presents with  . Flank Pain  . Abdominal Pain    (Consider location/radiation/quality/duration/timing/severity/associated sxs/prior treatment) HPI Patient presents emergency department with right quadrant abdominal pain.  Patient, states, that this began several days, ago, and he was seen here in the emergency department was referred to general surgery for followup.  Patient did not go for his followup the next day surgery.  Patient, states, the pain is worse, developed fever, and vomiting.  Patient denies chest pain, shortness breath, back pain, headache, visual changes, syncope, dizziness, weakness, or diarrhea.  Patient, states he did not take anything other than medications provided.  Patient, states pain is worse with palpation. Past Medical History  Diagnosis Date  . Kidney stones   . Kidney stones   . Hypertension   . Diabetes mellitus   . Headache     migraine history    Past Surgical History  Procedure Laterality Date  . Stents      kidney stones    Family History  Problem Relation Age of Onset  . Cancer - Lung Mother   . Seizures Sister     History  Substance Use Topics  . Smoking status: Current Every Day Smoker -- 1.00 packs/day    Types: Cigarettes  . Smokeless tobacco: Not on file  . Alcohol Use: No      Review of Systems All other systems negative except as documented in the HPI. All pertinent positives and negatives as reviewed in the HPI. Allergies  Compazine  Home Medications  No current outpatient prescriptions on file.  BP 165/99  Pulse 62  Temp(Src) 98.1 F (36.7 C) (Oral)  Resp 16  Ht 6' (1.829 m)  Wt 278 lb (126.1 kg)  BMI 37.7 kg/m2  SpO2 96%  Physical Exam  Nursing note and vitals reviewed. Constitutional: He appears well-developed and well-nourished. He appears distressed.   HENT:  Head: Normocephalic and atraumatic.  Mouth/Throat: Oropharynx is clear and moist.  Eyes: Pupils are equal, round, and reactive to light.  Cardiovascular: Normal rate, regular rhythm and normal heart sounds.  Exam reveals no gallop and no friction rub.   No murmur heard. Pulmonary/Chest: Effort normal and breath sounds normal.  Abdominal: Soft. Normal appearance and bowel sounds are normal. He exhibits no distension and no ascites. There is tenderness in the right upper quadrant. There is positive Murphy's sign. There is no rigidity, no rebound, no guarding and no CVA tenderness. No hernia.  Skin: Skin is warm and dry. No erythema.    ED Course  Procedures (including critical care time)  Labs Reviewed  URINALYSIS, ROUTINE W REFLEX MICROSCOPIC - Abnormal; Notable for the following:    Color, Urine AMBER (*)    All other components within normal limits  COMPREHENSIVE METABOLIC PANEL - Abnormal; Notable for the following:    Glucose, Bld 106 (*)    AST 50 (*)    ALT 66 (*)    All other components within normal limits  CBC WITH DIFFERENTIAL - Abnormal; Notable for the following:    RBC 5.89 (*)    Hemoglobin 17.5 (*)    Platelets 117 (*)    All other components within normal limits  GLUCOSE, CAPILLARY - Abnormal; Notable for the following:    Glucose-Capillary 120 (*)    All other components within  normal limits  LIPASE, BLOOD   US Abdomen Limited Ruq  03/25/2012  *RADIOLOGY REPORT*  Clinical Data:  History of gallstones.  Rule out cholecystitis.  LIMITED ABDOMINAL ULTRASOUND - RIGHT UPPER QUADRANT  Comparison:  03/21/2012  Findings:  Gallbladder:  1.7 cm gallstone.  No wall thickening or pericholecystic fluid.  The technologist describes tenderness with gallbladder palpation.  Common bile duct:  Upper normal to minimally dilated, maximally 7 mm.  Liver:  Within normal limits.  Incidental imaging of the right kidney demonstrates mild hydronephrosis.  Interpolar 6 mm stone  suspected.  IMPRESSION: Cholelithiasis.  Tenderness with gallbladder palpation.  Cannot exclude acute cholecystitis.  Mild right-sided hydronephrosis.  Cannot exclude distal obstructing stone.  Minimally dilated common duct.  Especially if the bilirubin level is elevated, contrast enhanced CT or MRCP should be considered.  On this exam, the right-sided hydronephrosis can also be evaluated.                    Original Report Authenticated By: Jeronimo Greaves, M.D.      1. Cholecystitis    I spoke with Dr. Donell Beers in general surgery, who accepts patient for admission at [facility] long spoke with Dr. Anitra Lauth of the ER as the patient will be transferred to the ER.   MDM  MDM Reviewed: nursing note and vitals Interpretation: labs and ultrasound Consults: general surgery            Carlyle Dolly, PA-C 03/26/12 0005

## 2012-03-27 ENCOUNTER — Encounter (HOSPITAL_COMMUNITY): Payer: Self-pay | Admitting: General Surgery

## 2012-03-27 LAB — GLUCOSE, CAPILLARY

## 2012-03-27 MED ORDER — OXYCODONE HCL 5 MG PO TABS: 5.0000 mg | ORAL_TABLET | ORAL | Status: DC | PRN

## 2012-03-27 MED ORDER — OXYMETAZOLINE HCL 0.05 % NA SOLN
1.0000 | Freq: Two times a day (BID) | NASAL | Status: DC
  Administered 2012-03-27 – 2012-03-29 (×3): 1 via NASAL
  Filled 2012-03-27: qty 15

## 2012-03-27 MED ORDER — CHLORHEXIDINE GLUCONATE CLOTH 2 % EX PADS
6.0000 | MEDICATED_PAD | Freq: Every day | CUTANEOUS | Status: DC
  Administered 2012-03-28 – 2012-03-29 (×2): 6 via TOPICAL

## 2012-03-27 MED ORDER — MUPIROCIN 2 % EX OINT
1.0000 "application " | TOPICAL_OINTMENT | Freq: Two times a day (BID) | CUTANEOUS | Status: DC
  Administered 2012-03-27 – 2012-03-29 (×5): 1 via NASAL
  Filled 2012-03-27: qty 22

## 2012-03-27 MED ORDER — OXYCODONE HCL 5 MG PO TABS
5.0000 mg | ORAL_TABLET | ORAL | Status: DC | PRN
  Administered 2012-03-27 – 2012-03-29 (×12): 15 mg via ORAL
  Filled 2012-03-27 (×12): qty 3

## 2012-03-27 NOTE — Discharge Summary (Signed)
  Physician Discharge Summary  Patient ID: COURT GRACIA MRN: [id number] DOB/AGE: [birth year] 51 y.o.  Admit date: 03/25/2012 Discharge date: 03/29/2012  Admitting Diagnosis: Symptomatic Cholelithiasis  Discharge Diagnosis Symptomatic cholelithiasis Nodular Liver  Consultants None  Procedures Laparoscopic Cholecystectomy with IOC (Dr. Romie Levee) Liver biopsy  Hospital Course 52 yr old male who presented to [facility] with flank pain and abdominal pain.  Workup showed symptomatic cholethiasis.  Patient was admitted and underwent procedure listed above.  He was found to have a nodular liver therefore a biopsy was taken.  Tolerated procedure well and was transferred to the floor.  Diet was advanced as tolerated.  However the patient continued to have significant abd pain out of proportion to his surgery.  We followed his LFTs and they did not bump significantly.  His pain medicine was modified and this help significantly.  He continued to tolerated his diet and pain improved.  On POD#3, the patient was voiding well, tolerating diet, ambulating well, pain well controlled, vital signs stable, incisions c/d/i and felt stable for discharge home.  Patient will follow up in our office in 2 weeks and knows to call with questions or concerns.    Medication List    TAKE these medications       aspirin 81 MG tablet  Take 81 mg by mouth daily as needed for pain.     hydrochlorothiazide 25 MG tablet  Commonly known as:  HYDRODIURIL  Take 25 mg by mouth every morning.     ibuprofen 200 MG tablet  Commonly known as:  ADVIL,MOTRIN  Take 200-400 mg by mouth every 6 (six) hours as needed for pain.     oxyCODONE 5 MG immediate release tablet  Commonly known as:  Oxy IR/ROXICODONE  Take 1-3 tablets (5-15 mg total) by mouth every 4 (four) hours as needed.         Follow-up Information   Follow up with [facility] On 04/18/2012. (Arrive at 11 for 11:15 appt.)    Contact information:    [address]   [place] [address] [phone]       Signed: Denny Levy [facility] [phone]  03/27/2012, 9:33 AM

## 2012-03-27 NOTE — Discharge Instructions (Signed)
[facility] ______[facility] °LAPAROSCOPIC SURGERY: POST OP INSTRUCTIONS °Always review your discharge instruction sheet given to you by the facility where your surgery was performed. °IF YOU HAVE DISABILITY OR FAMILY LEAVE FORMS, YOU MUST BRING THEM TO THE OFFICE FOR PROCESSING.   °DO NOT GIVE THEM TO YOUR DOCTOR. ° °1. A prescription for pain medication may be given to you upon discharge.  Take your pain medication as prescribed, if needed.  If narcotic pain medicine is not needed, then you may take acetaminophen (Tylenol) or ibuprofen (Advil) as needed. °2. Take your usually prescribed medications unless otherwise directed. °3. If you need a refill on your pain medication, please contact your pharmacy.  They will contact our office to request authorization. Prescriptions will not be filled after 5pm or on week-ends. °4. You should follow a light diet the first few days after arrival home, such as soup and crackers, etc.  Be sure to include lots of fluids daily. °5. Most patients will experience some swelling and bruising in the area of the incisions.  Ice packs will help.  Swelling and bruising can take several days to resolve.  °6. It is common to experience some constipation if taking pain medication after surgery.  Increasing fluid intake and taking a stool softener (such as Colace) will usually help or prevent this problem from occurring.  A mild laxative (Milk of Magnesia or Miralax) should be taken according to package instructions if there are no bowel movements after 48 hours. °7. Unless discharge instructions indicate otherwise, you may remove your bandages 24-48 hours after surgery, and you may shower at that time.  You may have steri-strips (small skin tapes) in place directly over the incision.  These strips should be left on the skin for 7-10 days.  If your surgeon used skin glue on the incision, you may shower in 24 hours.  The glue will flake off over the next 2-3 weeks.  Any sutures or  staples will be removed at the office during your follow-up visit. °8. ACTIVITIES:  You may resume regular (light) daily activities beginning the next day--such as daily self-care, walking, climbing stairs--gradually increasing activities as tolerated.  You may have sexual intercourse when it is comfortable.  Refrain from any heavy lifting or straining until approved by your doctor. °a. You may drive when you are no longer taking prescription pain medication, you can comfortably wear a seatbelt, and you can safely maneuver your car and apply brakes. °b. RETURN TO WORK:  __________________________________________________________ °9. You should see your doctor in the office for a follow-up appointment approximately 2-3 weeks after your surgery.  Make sure that you call for this appointment within a day or two after you arrive home to insure a convenient appointment time. °10. OTHER INSTRUCTIONS: __________________________________________________________________________________________________________________________ __________________________________________________________________________________________________________________________ °WHEN TO CALL YOUR DOCTOR: °1. Fever over 101.0 °2. Inability to urinate °3. Continued bleeding from incision. °4. Increased pain, redness, or drainage from the incision. °5. Increasing abdominal pain ° °The clinic staff is available to answer your questions during regular business hours.  Please don’t hesitate to call and ask to speak to one of the nurses for clinical concerns.  If you have a medical emergency, go to the nearest emergency room or call 911.  A surgeon from [facility] is always on call at the hospital. °[address], [address], [place], [place]  [address] ? [address] [address], [place], [place]   [address] °[phone] ? [phone] ? FAX [phone] °Web site:   [email or web address] °

## 2012-03-27 NOTE — Progress Notes (Signed)
Patient ID: Michael Carroll, male   DOB: 1960/12/07, 52 y.o.   MRN: [id number]  1 Day Post-Op  Subjective: Reports pretty pain control through night and this am, denies n/v, eating pretty well overall, denies fevers, chills  Objective: Vital signs in last 24 hours: Temp:  [98.4 F (36.9 C)-99.2 F (37.3 C)] 98.6 F (37 C) (03/10 0530) Pulse Rate:  [59-82] 63 (03/10 0530) Resp:  [13-18] 18 (03/10 0530) BP: (126-183)/(65-109) 149/76 mmHg (03/10 0530) SpO2:  [92 %-100 %] 92 % (03/10 0530) Last BM Date: 03/25/12  Intake/Output from previous day: 03/09 0701 - 03/10 0700 In: 3766.7 [P.O.:480; I.V.:2836.7; IV Piggyback:450] Out: 2050 [Urine:2050] Intake/Output this shift:    PE: Abd: soft, but tender in RUQ, incisions are c/d/i, +BS, no peritonitis but tender to palp along right and over incisiions General: awake, alert, NAD  Lab Results:   Recent Labs  03/25/12 1602  WBC 8.1  HGB 17.5*  HCT 48.9  PLT 117*   BMET  Recent Labs  03/25/12 1602  NA 137  K 4.1  CL 102  CO2 26  GLUCOSE 106*  BUN 14  CREATININE 0.80  CALCIUM 9.4   PT/INR No results found for this basename: LABPROT, INR,  in the last 72 hours CMP     Component Value Date/Time   NA 137 03/25/2012 1602   K 4.1 03/25/2012 1602   CL 102 03/25/2012 1602   CO2 26 03/25/2012 1602   GLUCOSE 106* 03/25/2012 1602   BUN 14 03/25/2012 1602   CREATININE 0.80 03/25/2012 1602   CALCIUM 9.4 03/25/2012 1602   PROT 7.0 03/25/2012 1602   ALBUMIN 3.6 03/25/2012 1602   AST 50* 03/25/2012 1602   ALT 66* 03/25/2012 1602   ALKPHOS 75 03/25/2012 1602   BILITOT 0.5 03/25/2012 1602   GFRNONAA >90 03/25/2012 1602   GFRAA >90 03/25/2012 1602   Lipase     Component Value Date/Time   LIPASE 42 03/25/2012 1602       Studies/Results: US Abdomen Limited Ruq  03/25/2012  *RADIOLOGY REPORT*  Clinical Data:  History of gallstones.  Rule out cholecystitis.  LIMITED ABDOMINAL ULTRASOUND - RIGHT UPPER QUADRANT  Comparison:  03/21/2012  Findings:   Gallbladder:  1.7 cm gallstone.  No wall thickening or pericholecystic fluid.  The technologist describes tenderness with gallbladder palpation.  Common bile duct:  Upper normal to minimally dilated, maximally 7 mm.  Liver:  Within normal limits.  Incidental imaging of the right kidney demonstrates mild hydronephrosis.  Interpolar 6 mm stone suspected.  IMPRESSION: Cholelithiasis.  Tenderness with gallbladder palpation.  Cannot exclude acute cholecystitis.  Mild right-sided hydronephrosis.  Cannot exclude distal obstructing stone.  Minimally dilated common duct.  Especially if the bilirubin level is elevated, contrast enhanced CT or MRCP should be considered.  On this exam, the right-sided hydronephrosis can also be evaluated.                    Original Report Authenticated By: Jeronimo Greaves, M.D.     Anti-infectives: Anti-infectives   None       Assessment/Plan 1. POD#1-lap chole: overall poor pain control but otherwise ok, no nausea or vomiting, will increase oxycodone and see if that helps, if he improves by this afternoon then he could go home, otherwise will need another day  --oxycodone 5-15mg  q4hrs prn pain  --recheck symptoms this afternoon and poss home   LOS: 2 days    WHITE, [facility] 03/27/2012

## 2012-03-28 LAB — CBC
HCT: 47.1 % (ref 39.0–52.0)
Hemoglobin: 16.4 g/dL (ref 13.0–17.0)
MCH: 29.4 pg (ref 26.0–34.0)
MCHC: 34.8 g/dL (ref 30.0–36.0)
MCV: 84.6 fL (ref 78.0–100.0)
Platelets: 91 10*3/uL — ABNORMAL LOW (ref 150–400)
RBC: 5.57 MIL/uL (ref 4.22–5.81)
RDW: 12.4 % (ref 11.5–15.5)
WBC: 7.8 10*3/uL (ref 4.0–10.5)

## 2012-03-28 LAB — DIFFERENTIAL
Basophils Absolute: 0.1 10*3/uL (ref 0.0–0.1)
Basophils Relative: 1 % (ref 0–1)
Eosinophils Absolute: 0.2 10*3/uL (ref 0.0–0.7)
Eosinophils Relative: 2 % (ref 0–5)
Lymphocytes Relative: 27 % (ref 12–46)
Lymphs Abs: 2.1 10*3/uL (ref 0.7–4.0)
Monocytes Absolute: 0.9 10*3/uL (ref 0.1–1.0)
Monocytes Relative: 12 % (ref 3–12)
Neutro Abs: 4.5 10*3/uL (ref 1.7–7.7)
Neutrophils Relative %: 58 % (ref 43–77)

## 2012-03-28 LAB — COMPREHENSIVE METABOLIC PANEL
AST: 70 U/L — ABNORMAL HIGH (ref 0–37)
Albumin: 3.3 g/dL — ABNORMAL LOW (ref 3.5–5.2)
Alkaline Phosphatase: 66 U/L (ref 39–117)
BUN: 12 mg/dL (ref 6–23)
Chloride: 101 mEq/L (ref 96–112)
Potassium: 4 mEq/L (ref 3.5–5.1)
Total Bilirubin: 1.1 mg/dL (ref 0.3–1.2)

## 2012-03-28 NOTE — Progress Notes (Signed)
Patient ID: Michael Carroll, male   DOB: Aug 01, 1960, 52 y.o.   MRN: [id number]  2 Days Post-Op  Subjective: Better today but having sharp shooting pains in the right side and right back, has had kidney stones before and worried it might be from that, the new pain med regimen is helping  Objective: Vital signs in last 24 hours: Temp:  [98.3 F (36.8 C)-99 F (37.2 C)] 98.3 F (36.8 C) (03/11 0631) Pulse Rate:  [60-73] 70 (03/11 0631) Resp:  [18] 18 (03/11 0631) BP: (152-162)/(78-90) 152/88 mmHg (03/11 0631) SpO2:  [90 %-96 %] 94 % (03/11 0631) Last BM Date: 03/25/12  Intake/Output from previous day: 03/10 0701 - 03/11 0700 In: 2400 [I.V.:2400] Out: 2575 [Urine:2575] Intake/Output this shift:    PE: Abd: soft, but tender in RUQ, incisions are c/d/i, +BS, no peritonitis but tender to palp along right and over incisiions General: awake, alert, NAD  Lab Results:   Recent Labs  03/25/12 1602  WBC 8.1  HGB 17.5*  HCT 48.9  PLT 117*   BMET  Recent Labs  03/25/12 1602  NA 137  K 4.1  CL 102  CO2 26  GLUCOSE 106*  BUN 14  CREATININE 0.80  CALCIUM 9.4   PT/INR No results found for this basename: LABPROT, INR,  in the last 72 hours CMP     Component Value Date/Time   NA 137 03/25/2012 1602   K 4.1 03/25/2012 1602   CL 102 03/25/2012 1602   CO2 26 03/25/2012 1602   GLUCOSE 106* 03/25/2012 1602   BUN 14 03/25/2012 1602   CREATININE 0.80 03/25/2012 1602   CALCIUM 9.4 03/25/2012 1602   PROT 7.0 03/25/2012 1602   ALBUMIN 3.6 03/25/2012 1602   AST 50* 03/25/2012 1602   ALT 66* 03/25/2012 1602   ALKPHOS 75 03/25/2012 1602   BILITOT 0.5 03/25/2012 1602   GFRNONAA >90 03/25/2012 1602   GFRAA >90 03/25/2012 1602   Lipase     Component Value Date/Time   LIPASE 42 03/25/2012 1602       Studies/Results: No results found.  Anti-infectives: Anti-infectives   None       Assessment/Plan 1. POD#2-lap chole: pain controlled much better, i looked at his u/s on admission and he did have  some mild right hydronephrosis, the patient does have a strong history requiring stenting, will check labs this am and talk to Dr. Carolynne Edouard about evaluation for kidney stones  --oxycodone 5-15mg  q4hrs prn pain  --regular diet  --OOB  --?evaluation for kidney stones?   LOS: 3 days    WHITE, ELIZABETH 03/28/2012

## 2012-03-28 NOTE — Progress Notes (Signed)
Will check LFT's and wbc today. If abnormal then he may need a HIDA scan to rule out bile leak given his pain level

## 2012-03-28 NOTE — Progress Notes (Signed)
Spoke with Dr. Carolynne Edouard states will review lab work later is in OR cases, read resuts of AST and ALT

## 2012-03-29 LAB — COMPREHENSIVE METABOLIC PANEL
ALT: 74 U/L — ABNORMAL HIGH (ref 0–53)
AST: 62 U/L — ABNORMAL HIGH (ref 0–37)
Albumin: 3.1 g/dL — ABNORMAL LOW (ref 3.5–5.2)
Alkaline Phosphatase: 64 U/L (ref 39–117)
BUN: 11 mg/dL (ref 6–23)
CO2: 28 mEq/L (ref 19–32)
Calcium: 8.8 mg/dL (ref 8.4–10.5)
Chloride: 100 mEq/L (ref 96–112)
Creatinine, Ser: 0.82 mg/dL (ref 0.50–1.35)
GFR calc Af Amer: >90 mL/min (ref >90)
GFR calc non Af Amer: >90 mL/min (ref >90)
Glucose, Bld: 114 mg/dL — ABNORMAL HIGH (ref 70–99)
Potassium: 4 mEq/L (ref 3.5–5.1)
Sodium: 136 mEq/L (ref 135–145)
Total Bilirubin: 1.2 mg/dL (ref 0.3–1.2)
Total Protein: 6.7 g/dL (ref 6.0–8.3)

## 2012-03-29 NOTE — Progress Notes (Signed)
Spoke to [employer], [place] for [facility] on floor informed patient refusing HCTZ states he has not taken in months due to it gave him kidney stones, however b/p trends elevated, patient states he does not take anything for blood pressure at home. Pa states she will address on rounds.

## 2012-03-29 NOTE — Progress Notes (Signed)
Patient discharged ambulatory. rx for oxycodone given. Clance Boll, PA for [facility] states she believes increased b/p due to pain. Patient states understanding of discharge instructions.

## 2012-04-03 ENCOUNTER — Telehealth (INDEPENDENT_AMBULATORY_CARE_PROVIDER_SITE_OTHER): Payer: Self-pay | Admitting: General Surgery

## 2012-04-03 MED ORDER — HYDROCODONE-ACETAMINOPHEN 5-325 MG PO TABS: 1.0000 | ORAL_TABLET | Freq: Four times a day (QID) | ORAL | Status: DC | PRN

## 2012-04-03 NOTE — Telephone Encounter (Signed)
Patient called for refill of pain medication after gallbladder surgery. Patient was taking oxycodone. He is okay with stepping down to Norco per protocol. Norco 5/325 #30 with no refills called to [facility] out patient pharmacy. Patient will call with any problems prior to follow up appt.

## 2012-04-11 ENCOUNTER — Telehealth (INDEPENDENT_AMBULATORY_CARE_PROVIDER_SITE_OTHER): Payer: Self-pay

## 2012-04-11 NOTE — Telephone Encounter (Signed)
I left this pt a message to call back.  I am not sure what test results he is referring to.

## 2012-04-11 NOTE — Telephone Encounter (Signed)
Message copied by Ivory Broad on Tue Apr 11, 2012  4:17 PM ------      Message from: June Leap      Created: Tue Apr 11, 2012  2:20 PM      Contact: [phone]       This was in my box not sure if you received it       ----- Message -----         From: Marnette Burgess         Sent: 04/11/2012   1:12 PM           To: June Leap            Patient calling about test results, please call.       ------

## 2012-04-12 ENCOUNTER — Telehealth (INDEPENDENT_AMBULATORY_CARE_PROVIDER_SITE_OTHER): Payer: Self-pay | Admitting: General Surgery

## 2012-04-12 NOTE — Telephone Encounter (Signed)
Pt called to ask about results of liver bx taken at the time of his gallbladder surgery.  Please call pt on his cell:  [phone].

## 2012-04-12 NOTE — Telephone Encounter (Signed)
Dr Maisie Fus called the pt back.

## 2012-04-12 NOTE — Telephone Encounter (Signed)
Please let him know his biopsy showed cirrhosis of unknown etiology.  We will talk about what he needs to do next when he sees me back in the office.

## 2012-04-18 ENCOUNTER — Ambulatory Visit (INDEPENDENT_AMBULATORY_CARE_PROVIDER_SITE_OTHER): Payer: Self-pay | Admitting: Internal Medicine

## 2012-04-18 ENCOUNTER — Encounter (INDEPENDENT_AMBULATORY_CARE_PROVIDER_SITE_OTHER): Payer: Self-pay | Admitting: Internal Medicine

## 2012-04-18 VITALS — BP 146/84 | HR 82 | Temp 97.1°F | Resp 16 | Ht 72.0 in | Wt 281.0 lb

## 2012-04-18 DIAGNOSIS — K8010 Calculus of gallbladder with chronic cholecystitis without obstruction: Secondary | ICD-10-CM

## 2012-04-18 NOTE — Patient Instructions (Signed)
May resume regular activity without restrictions. Follow up as needed. Call with questions or concerns.  

## 2012-04-18 NOTE — Progress Notes (Signed)
  Subjective: Pt returns to the clinic today after undergoing laparoscopic cholecystectomy on 03/26/12 by Dr. Clovis Pu.  The patient is tolerating their diet well and is having no severe pain.  Only occasional nausea.  Bowel function is good.  No problems with the wounds.  Objective: Vital signs in last 24 hours: Reviewed  PE: Abd: soft, non-tender, +bs, incisions well healed  Lab Results:  No results found for this basename: WBC, HGB, HCT, PLT,  in the last 72 hours BMET No results found for this basename: NA, K, CL, CO2, GLUCOSE, BUN, CREATININE, CALCIUM,  in the last 72 hours PT/INR No results found for this basename: LABPROT, INR,  in the last 72 hours CMP     Component Value Date/Time   NA 136 03/29/2012 0755   K 4.0 03/29/2012 0755   CL 100 03/29/2012 0755   CO2 28 03/29/2012 0755   GLUCOSE 114* 03/29/2012 0755   BUN 11 03/29/2012 0755   CREATININE 0.82 03/29/2012 0755   CALCIUM 8.8 03/29/2012 0755   PROT 6.7 03/29/2012 0755   ALBUMIN 3.1* 03/29/2012 0755   AST 62* 03/29/2012 0755   ALT 74* 03/29/2012 0755   ALKPHOS 64 03/29/2012 0755   BILITOT 1.2 03/29/2012 0755   GFRNONAA >90 03/29/2012 0755   GFRAA >90 03/29/2012 0755   Lipase     Component Value Date/Time   LIPASE 42 03/25/2012 1602       Studies/Results: No results found.  Anti-infectives: Anti-infectives   None       Assessment/Plan  1.  S/P Laparoscopic Cholecystectomy: doing well, may resume regular activity without restrictions, Pt will follow up with [place] PRN and knows to call with questions or concerns.     Elaura Calix 04/18/2012

## 2012-04-24 ENCOUNTER — Telehealth (INDEPENDENT_AMBULATORY_CARE_PROVIDER_SITE_OTHER): Payer: Self-pay | Admitting: *Deleted

## 2012-04-24 ENCOUNTER — Encounter (HOSPITAL_COMMUNITY): Payer: Self-pay | Admitting: *Deleted

## 2012-04-24 ENCOUNTER — Emergency Department (HOSPITAL_COMMUNITY): Payer: Self-pay

## 2012-04-24 ENCOUNTER — Emergency Department (HOSPITAL_COMMUNITY)
Admission: EM | Admit: 2012-04-24 | Discharge: 2012-04-24 | Disposition: A | Discharge status: Home / Self Care | Payer: Self-pay | Attending: Emergency Medicine | Admitting: Emergency Medicine

## 2012-04-24 DIAGNOSIS — I10 Essential (primary) hypertension: Secondary | ICD-10-CM | POA: Insufficient documentation

## 2012-04-24 DIAGNOSIS — Z79899 Other long term (current) drug therapy: Secondary | ICD-10-CM | POA: Insufficient documentation

## 2012-04-24 DIAGNOSIS — R109 Unspecified abdominal pain: Secondary | ICD-10-CM

## 2012-04-24 DIAGNOSIS — Z87442 Personal history of urinary calculi: Secondary | ICD-10-CM | POA: Insufficient documentation

## 2012-04-24 DIAGNOSIS — E119 Type 2 diabetes mellitus without complications: Secondary | ICD-10-CM | POA: Insufficient documentation

## 2012-04-24 DIAGNOSIS — F17200 Nicotine dependence, unspecified, uncomplicated: Secondary | ICD-10-CM | POA: Insufficient documentation

## 2012-04-24 DIAGNOSIS — Z8679 Personal history of other diseases of the circulatory system: Secondary | ICD-10-CM | POA: Insufficient documentation

## 2012-04-24 DIAGNOSIS — Z9089 Acquired absence of other organs: Secondary | ICD-10-CM | POA: Insufficient documentation

## 2012-04-24 DIAGNOSIS — Z9049 Acquired absence of other specified parts of digestive tract: Secondary | ICD-10-CM

## 2012-04-24 DIAGNOSIS — R1011 Right upper quadrant pain: Secondary | ICD-10-CM | POA: Insufficient documentation

## 2012-04-24 LAB — COMPREHENSIVE METABOLIC PANEL
ALT: 61 U/L — ABNORMAL HIGH (ref 0–53)
AST: 48 U/L — ABNORMAL HIGH (ref 0–37)
Calcium: 9.6 mg/dL (ref 8.4–10.5)
Creatinine, Ser: 0.78 mg/dL (ref 0.50–1.35)
GFR calc Af Amer: >90 mL/min (ref >90)
Sodium: 141 mEq/L (ref 135–145)
Total Protein: 6.9 g/dL (ref 6.0–8.3)

## 2012-04-24 LAB — CBC WITH DIFFERENTIAL/PLATELET
Eosinophils Relative: 8 % — ABNORMAL HIGH (ref 0–5)
HCT: 48.6 % (ref 39.0–52.0)
Lymphs Abs: 2.2 10*3/uL (ref 0.7–4.0)
MCV: 84.5 fL (ref 78.0–100.0)
Monocytes Relative: 8 % (ref 3–12)
Neutro Abs: 3.8 10*3/uL (ref 1.7–7.7)
RBC: 5.75 MIL/uL (ref 4.22–5.81)
WBC: 7.2 10*3/uL (ref 4.0–10.5)

## 2012-04-24 MED ORDER — PROMETHAZINE HCL 25 MG/ML IJ SOLN
25.0000 mg | INTRAMUSCULAR | Status: AC
  Administered 2012-04-24: 25 mg via INTRAVENOUS
  Filled 2012-04-24: qty 1

## 2012-04-24 MED ORDER — IOHEXOL 300 MG/ML  SOLN
100.0000 mL | Freq: Once | INTRAMUSCULAR | Status: AC | PRN
  Administered 2012-04-24: 100 mL via INTRAVENOUS

## 2012-04-24 MED ORDER — OXYCODONE-ACETAMINOPHEN 5-325 MG PO TABS: 2.0000 | ORAL_TABLET | ORAL | Status: DC | PRN

## 2012-04-24 MED ORDER — HYDROMORPHONE HCL PF 1 MG/ML IJ SOLN
1.0000 mg | Freq: Once | INTRAMUSCULAR | Status: AC
  Administered 2012-04-24: 1 mg via INTRAVENOUS
  Filled 2012-04-24: qty 1

## 2012-04-24 MED ORDER — IOHEXOL 300 MG/ML  SOLN
50.0000 mL | Freq: Once | INTRAMUSCULAR | Status: AC | PRN
  Administered 2012-04-24: 50 mL via ORAL

## 2012-04-24 MED ORDER — ONDANSETRON HCL 4 MG PO TABS: 4.0000 mg | ORAL_TABLET | Freq: Four times a day (QID) | ORAL | Status: DC

## 2012-04-24 NOTE — Progress Notes (Signed)
CT is negative for any evidence of complication of cholecystectomy.  Pain sounds musculoskeletal.  I spoke with the ED PA and recommended pain medication, heat and light activity.

## 2012-04-24 NOTE — Telephone Encounter (Signed)
Patient called in to state severe pain that is constant at this time at the site of his lap chole.  Patient states that pain has been getting progressively worse since after the surgery.  Patient states pain becomes so bad at times that it "makes me go pale".  Spoke to [place] PA who saw patient in the office last week and she recommended patient be seen in ED since surgery was 03/26/12.  Patient updated and agreeable with POC.  Patient states he will head to [facility] ED at this time.

## 2012-04-24 NOTE — ED Notes (Signed)
Pt comes in with c/o abdominal pain RUQ; pt sts had gall bladder removal in March, and sts had pain ever since, but not as bad as last 2-3 days. Pt sts pain is now worse then before surgery and it radiates to back.

## 2012-04-24 NOTE — ED Provider Notes (Signed)
History     CSN: [id number]  Arrival date & time 04/24/12  1232   First MD Initiated Contact with Patient 04/24/12 1325      Chief Complaint  Patient presents with  . Abdominal Pain    (Consider location/radiation/quality/duration/timing/severity/associated sxs/prior treatment) HPI Comments: This is a 52 year old man who presents today 1 month s/p cholecystectomy with RUQ pain. The pain is sharp and radiates down to his RLQ and then to his right flank. The pain has been constant since his surgery, but recently worsened 2-3 days ago. No vomiting or diarrhea, fevers, CP, SOB.   Patient is a 52 y.o. male presenting with abdominal pain. The history is provided by the patient. No language interpreter was used.  Abdominal Pain Pain location:  RUQ Pain quality: sharp, shooting and stabbing   Pain radiates to:  RLQ and R flank Pain severity:  Severe Onset quality:  Sudden Duration:  3 days Timing:  Constant Progression:  Worsening Chronicity:  New Context: previous surgery   Context: not recent illness, not sick contacts, not suspicious food intake and not trauma   Relieved by: laying still. Worsened by:  Movement Associated symptoms: no chest pain, no chills, no constipation, no fever, no shortness of breath and no vomiting     Past Medical History  Diagnosis Date  . Kidney stones   . Kidney stones   . Hypertension   . Diabetes mellitus   . Headache     migraine history    Past Surgical History  Procedure Laterality Date  . Stents      kidney stones  . Cholecystectomy N/A 03/26/2012    Procedure: LAPAROSCOPIC CHOLECYSTECTOMY;  Surgeon: Romie Levee, MD;  Location: [facility];  Service: General;  Laterality: N/A;  . Liver biopsy N/A 03/26/2012    Procedure: LIVER BIOPSY;  Surgeon: Romie Levee, MD;  Location: [facility];  Service: General;  Laterality: N/A;    Family History  Problem Relation Age of Onset  . Cancer - Lung Mother   . Cancer Mother   . Seizures Sister   . Heart  disease Father     History  Substance Use Topics  . Smoking status: Current Every Day Smoker -- 1.50 packs/day    Types: Cigarettes  . Smokeless tobacco: Not on file  . Alcohol Use: No      Review of Systems  Constitutional: Negative for fever and chills.  Respiratory: Negative for shortness of breath.   Cardiovascular: Negative for chest pain.  Gastrointestinal: Positive for abdominal pain. Negative for vomiting and constipation.  All other systems reviewed and are negative.    Allergies  Compazine  Home Medications   Current Outpatient Rx  Name  Route  Sig  Dispense  Refill  . aspirin 81 MG tablet   Oral   Take 81 mg by mouth daily as needed for pain.         Marland Kitchen ibuprofen (ADVIL,MOTRIN) 200 MG tablet   Oral   Take 200-400 mg by mouth every 6 (six) hours as needed for pain.         . Multiple Vitamin (MULTIVITAMIN WITH MINERALS) TABS   Oral   Take 1 tablet by mouth daily.         . hydrochlorothiazide (HYDRODIURIL) 25 MG tablet   Oral   Take 25 mg by mouth every morning.           BP 149/86  Pulse 95  Temp(Src) 98.2 F (36.8 C) (Oral)  Resp 20  SpO2 96%  Physical Exam  Nursing note and vitals reviewed. Constitutional: He is oriented to person, place, and time. He appears well-developed and well-nourished. He does not appear ill. No distress.  obese  HENT:  Head: Normocephalic and atraumatic.  Right Ear: External ear normal.  Left Ear: External ear normal.  Nose: Nose normal.  Eyes: Conjunctivae are normal.  Neck: Normal range of motion. No tracheal deviation present.  Cardiovascular: Normal rate, regular rhythm and normal heart sounds.   Pulmonary/Chest: Effort normal and breath sounds normal. No stridor.  Abdominal: Soft. Normal appearance. He exhibits no distension. There is tenderness in the right upper quadrant. There is no rigidity, no rebound and no guarding.  Musculoskeletal: Normal range of motion.  Neurological: He is alert and  oriented to person, place, and time.  Skin: Skin is warm and dry. He is not diaphoretic.  Psychiatric: He has a normal mood and affect. His behavior is normal.    ED Course  Procedures (including critical care time)  Labs Reviewed  CBC WITH DIFFERENTIAL - Abnormal; Notable for the following:    Platelets 119 (*)    Eosinophils Relative 8 (*)    All other components within normal limits  COMPREHENSIVE METABOLIC PANEL - Abnormal; Notable for the following:    Glucose, Bld 141 (*)    AST 48 (*)    ALT 61 (*)    All other components within normal limits  LIPASE, BLOOD - Abnormal; Notable for the following:    Lipase 66 (*)    All other components within normal limits   Ct Abdomen Pelvis W Contrast  04/24/2012  *RADIOLOGY REPORT*  Clinical Data: Right upper quadrant abdominal pain and status post laparoscopic cholecystectomy on 03/26/2012.  CT ABDOMEN AND PELVIS WITH CONTRAST  Technique:  Multidetector CT imaging of the abdomen and pelvis was performed following the standard protocol during bolus administration of intravenous contrast.  Contrast: OMNIPAQUE IOHEXOL 300 MG/ML SOLN, 50mL OMNIPAQUE IOHEXOL 300 MG/ML  SOLN  Comparison: Unenhanced CT of the abdomen and pelvis on 08/10/2011.  Findings: After cholecystectomy, there is no evidence of postoperative abscess or abnormal fluid collection.  No biliary ductal dilatation is identified.  The liver shows somewhat nodular surface contour of the left lobe.  This may be consistent with early changes of cirrhosis.  No focal hepatic masses are identified.  The pancreas, spleen and adrenal glands are within normal limits. There are multiple small, nonobstructing calculi identified throughout the right kidney.  The largest is within the lower pole and measures approximately 5 mm.  No significant hydronephrosis is identified.  There is some chronic mild dilatation of the renal pelvis on the right.  The left kidney demonstrates no evidence of calculi.   No ureteral or bladder calculi are identified.  The no masses or enlarged lymph nodes are seen.  No hernias are identified. Bony structures are unremarkable.  IMPRESSION:  1.  No evidence of complication after recent cholecystectomy. 2.  Multiple nonobstructing calculi in the right kidney with some degree of chronic dilatation of the right renal pelvis.   Original Report Authenticated By: Irish Lack, M.D.    Dg Abd Acute W/chest  04/24/2012  *RADIOLOGY REPORT*  Clinical Data: Right upper quadrant abdominal pain.  History of cholecystectomy on 03/26/2012.  ACUTE ABDOMEN SERIES (ABDOMEN 2 VIEW & CHEST 1 VIEW)  Comparison: CT of the abdomen and pelvis on 08/10/2011.  Findings: The lungs are clear.  Heart size is normal.  Abdominal  films demonstrate a normal bowel gas pattern.  No free air is identified.  Clips are present related to cholecystectomy.  No abnormal calcifications are identified.  Visualized bony structures are unremarkable.  IMPRESSION: No acute findings in the chest or abdomen.   Original Report Authenticated By: Irish Lack, M.D.      1. Abdominal pain   2. S/P cholecystectomy       MDM  Patient presents today with RUQ pain s/p a cholecystectomy 1 month ago. No acute findings on abd series. Mild elevation of lipase. CBC and CMP generally WNL.  [facility] consulted. HIDA scan unable to be performed today. CT of abd shows no complication of prior cholecystectomy. Shows multiple renal stones. Doubt this is what is causing pain. Surgery saw this patient and plans to follow him as outpatient. Dr. Abbey Chatters suggested this was more musculoskeletal pain in nature. Pain medication given in ED. Percocet given to control pain until he can follow up. Vital signs stable for discharge. Patient / Family / Caregiver informed of clinical course, understand medical decision-making process, and agree with plan.        Mora Bellman, PA-C 04/24/12 2014

## 2012-04-24 NOTE — ED Provider Notes (Signed)
Medical screening examination/treatment/procedure(s) were conducted as a shared visit with non-physician practitioner(s) and myself.  I personally evaluated the patient during the encounter  Discussed with general surgery.  They will evaluate the patient in the ED.  Recommend additional HIDA scan and CT scan abdomen and pelvics with his persistent pain post op cholecystectomy.  Celene Kras, MD 04/24/12 [phone]

## 2012-04-24 NOTE — Progress Notes (Signed)
Subjective: Back complaining of pain and tenderness RUQ, just below the rib.  Says it comes and goes, got worse on Friday and over weekend. Hurts to breath deep and hurts to sit up for exam.  Sent to the ER for evaluation by the office.  He says he can eat, but not eating as much.  Breakfast yesterday eggs and Tomasa Blase.  Last PM he had [employer]. Some nausea, no vomiting.  Last BM was yesterday and it was normal. Voiding normally, no pain says it smells "heavy."  No Chest pain, no SOB. ROS: CV; NEG  PULM: NEG, CARD: NEG Normal stress test 2004, GI: no gerd, + nausea, no vomiting, eating and having BM without difficulty, no blood.  Gu: neg, LE: neg, HENT: neg  Skin: neg He denies ETOH, or drug use.  Tobacco: 1PPD for 33 years. Objective: Vital signs in last 24 hours: Temp:  [98.2 F (36.8 C)] 98.2 F (36.8 C) (04/07 1321) Pulse Rate:  [95] 95 (04/07 1321) Resp:  [20] 20 (04/07 1321) BP: (149)/(86) 149/86 mmHg (04/07 1321) SpO2:  [96 %] 96 % (04/07 1321)    Intake/Output from previous day:   Intake/Output this shift:    General appearance: alert, cooperative and no distress Head: Normocephalic, without obvious abnormality, atraumatic Neck: no adenopathy, no carotid bruit, no JVD, supple, symmetrical, trachea midline and thyroid not enlarged, symmetric, no tenderness/mass/nodules Resp: clear to auscultation bilaterally Cardio: regular rate and rhythm, S1, S2 normal, no murmur, click, rub or gallop GI: soft, tender in there RUQ, but does not look or act like a bile leak.; bowel sounds normal; no masses,  no organomegaly, incisions healing nicely. Extremities: extremities normal, atraumatic, no cyanosis or edema Skin: Skin color, texture, turgor normal. No rashes or lesions Neurologic: Grossly normal  Prior to Admission medications   Medication Sig Start Date End Date Taking? Authorizing Provider  aspirin 81 MG tablet Take 81 mg by mouth daily as needed for pain.   Yes Historical  Provider, MD  ibuprofen (ADVIL,MOTRIN) 200 MG tablet Take 200-400 mg by mouth every 6 (six) hours as needed for pain.   Yes Historical Provider, MD Says he's not taking this and it didn't really help.  Multiple Vitamin (MULTIVITAMIN WITH MINERALS) TABS Take 1 tablet by mouth daily.   Yes Historical Provider, MD  hydrochlorothiazide (HYDRODIURIL) 25 MG tablet Take 25 mg by mouth every morning.    Historical Provider, MD Pt states he's not currently on this, no primary care provider.    Lab Results:   Recent Labs  04/24/12 1245  WBC 7.2  HGB 16.9  HCT 48.6  PLT 119*    BMET  Recent Labs  04/24/12 1245  NA 141  K 3.9  CL 104  CO2 29  GLUCOSE 141*  BUN 17  CREATININE 0.78  CALCIUM 9.6   PT/INR No results found for this basename: LABPROT, INR,  in the last 72 hours   Recent Labs Lab 04/24/12 1245  AST 48*  ALT 61*  ALKPHOS 80  BILITOT 0.5  PROT 6.9  ALBUMIN 3.5     Lipase     Component Value Date/Time   LIPASE 66* 04/24/2012 1245     Studies/Results: Dg Abd Acute W/chest  04/24/2012  *RADIOLOGY REPORT*  Clinical Data: Right upper quadrant abdominal pain.  History of cholecystectomy on 03/26/2012.  ACUTE ABDOMEN SERIES (ABDOMEN 2 VIEW & CHEST 1 VIEW)  Comparison: CT of the abdomen and pelvis on 08/10/2011.  Findings: The lungs are  clear.  Heart size is normal.  Abdominal films demonstrate a normal bowel gas pattern.  No free air is identified.  Clips are present related to cholecystectomy.  No abnormal calcifications are identified.  Visualized bony structures are unremarkable.  IMPRESSION: No acute findings in the chest or abdomen.   Original Report Authenticated By: Irish Lack, M.D.     Medications: .  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  Never been evaluated.  No primary care.  Denies ETOH use, Drug use. 3.Tobacco use  (1ppd 33 years) 4.hx of nephrolithiasis 5. Hypertension 6. BMI 38  Plan:  He has been seen by Dr. Abbey Chatters, we have ask the ER to get a CT and HIDA scan.  I will add a UA.  Dr. Biagio Quint is on and he will follow up after this is all done.        LOS: 0 days    Mossie Gilder 04/24/2012

## 2012-04-24 NOTE — Progress Notes (Signed)
Patient seen and examined.  Pain has progressively worsened over the past 3 days.  Will evaluate to make sure he does not have complications from cholecystectomy.

## 2012-04-27 ENCOUNTER — Telehealth (INDEPENDENT_AMBULATORY_CARE_PROVIDER_SITE_OTHER): Payer: Self-pay | Admitting: General Surgery

## 2012-04-27 NOTE — Telephone Encounter (Signed)
Pt called to discuss his ongoing pain and nausea.  He was in the ED on Monday (3days ago) for the same.  CT was clear, offering no indication of etiology of pain.  Explained all to the pt and suggested he contact his PCP for work up.  With surgery now over 4 weeks ago, and no labs or imaging to suggest complications of surgery, it is doubtful his current symptoms are related to the lap chole.  He agrees and understands.

## 2012-05-10 ENCOUNTER — Telehealth (INDEPENDENT_AMBULATORY_CARE_PROVIDER_SITE_OTHER): Payer: Self-pay | Admitting: General Surgery

## 2012-05-10 ENCOUNTER — Other Ambulatory Visit (INDEPENDENT_AMBULATORY_CARE_PROVIDER_SITE_OTHER): Payer: Self-pay

## 2012-05-10 ENCOUNTER — Encounter: Payer: Self-pay | Admitting: Gastroenterology

## 2012-05-10 DIAGNOSIS — K7460 Unspecified cirrhosis of liver: Secondary | ICD-10-CM

## 2012-05-10 NOTE — Telephone Encounter (Signed)
Spoke with wife her husband Mr Schrieber has appt with Dr Elwin [facility]  On 05/31/12 at 830am

## 2012-05-31 ENCOUNTER — Ambulatory Visit: Payer: Medicaid Other | Admitting: Gastroenterology

## 2012-08-29 ENCOUNTER — Emergency Department: Payer: Self-pay | Admitting: Emergency Medicine

## 2012-08-29 LAB — URINALYSIS, COMPLETE
Bilirubin,UR: NEGATIVE
Blood: NEGATIVE
Ketone: NEGATIVE
Ph: 5 (ref 4.5–8.0)
Protein: NEGATIVE
Squamous Epithelial: <1

## 2012-08-29 LAB — COMPREHENSIVE METABOLIC PANEL
Alkaline Phosphatase: 87 U/L (ref 50–136)
Anion Gap: 2 — ABNORMAL LOW (ref 7–16)
BUN: 19 mg/dL — ABNORMAL HIGH (ref 7–18)
Bilirubin,Total: 0.5 mg/dL (ref 0.2–1.0)
Chloride: 109 mmol/L — ABNORMAL HIGH (ref 98–107)
Co2: 28 mmol/L (ref 21–32)
EGFR (African American): >60
EGFR (Non-African Amer.): >60
Glucose: 143 mg/dL — ABNORMAL HIGH (ref 65–99)
Potassium: 4.3 mmol/L (ref 3.5–5.1)
SGPT (ALT): 102 U/L — ABNORMAL HIGH (ref 12–78)
Total Protein: 7.2 g/dL (ref 6.4–8.2)

## 2012-08-29 LAB — CBC
HGB: 17.6 g/dL (ref 13.0–18.0)
MCV: 87 fL (ref 80–100)
Platelet: 120 10*3/uL — ABNORMAL LOW (ref 150–440)
WBC: 8.4 10*3/uL (ref 3.8–10.6)

## 2012-11-30 ENCOUNTER — Emergency Department (HOSPITAL_BASED_OUTPATIENT_CLINIC_OR_DEPARTMENT_OTHER)
Admission: EM | Admit: 2012-11-30 | Discharge: 2012-11-30 | Disposition: A | Discharge status: Home / Self Care | Payer: Medicaid Other | Attending: Emergency Medicine | Admitting: Emergency Medicine

## 2012-11-30 ENCOUNTER — Emergency Department (HOSPITAL_BASED_OUTPATIENT_CLINIC_OR_DEPARTMENT_OTHER): Payer: Medicaid Other

## 2012-11-30 ENCOUNTER — Encounter (HOSPITAL_BASED_OUTPATIENT_CLINIC_OR_DEPARTMENT_OTHER): Payer: Self-pay | Admitting: Emergency Medicine

## 2012-11-30 DIAGNOSIS — Z7982 Long term (current) use of aspirin: Secondary | ICD-10-CM | POA: Insufficient documentation

## 2012-11-30 DIAGNOSIS — I10 Essential (primary) hypertension: Secondary | ICD-10-CM | POA: Insufficient documentation

## 2012-11-30 DIAGNOSIS — N200 Calculus of kidney: Secondary | ICD-10-CM

## 2012-11-30 DIAGNOSIS — Z8679 Personal history of other diseases of the circulatory system: Secondary | ICD-10-CM | POA: Insufficient documentation

## 2012-11-30 DIAGNOSIS — E119 Type 2 diabetes mellitus without complications: Secondary | ICD-10-CM | POA: Insufficient documentation

## 2012-11-30 DIAGNOSIS — Z79899 Other long term (current) drug therapy: Secondary | ICD-10-CM | POA: Insufficient documentation

## 2012-11-30 DIAGNOSIS — F17200 Nicotine dependence, unspecified, uncomplicated: Secondary | ICD-10-CM | POA: Insufficient documentation

## 2012-11-30 HISTORY — DX: Unspecified cirrhosis of liver: K74.60

## 2012-11-30 LAB — URINALYSIS, ROUTINE W REFLEX MICROSCOPIC
Glucose, UA: NEGATIVE mg/dL
Hgb urine dipstick: NEGATIVE
Specific Gravity, Urine: 1.028 (ref 1.005–1.030)
Urobilinogen, UA: 4 mg/dL — ABNORMAL HIGH (ref 0.0–1.0)

## 2012-11-30 LAB — CBC WITH DIFFERENTIAL/PLATELET
Basophils Absolute: 0 10*3/uL (ref 0.0–0.1)
Eosinophils Relative: 2 % (ref 0–5)
Lymphocytes Relative: 27 % (ref 12–46)
Lymphs Abs: 1.7 10*3/uL (ref 0.7–4.0)
MCV: 85.9 fL (ref 78.0–100.0)
Monocytes Relative: 12 % (ref 3–12)
Platelets: 111 10*3/uL — ABNORMAL LOW (ref 150–400)
RBC: 5.6 MIL/uL (ref 4.22–5.81)
RDW: 12.6 % (ref 11.5–15.5)
WBC: 6.3 10*3/uL (ref 4.0–10.5)

## 2012-11-30 LAB — COMPREHENSIVE METABOLIC PANEL
ALT: 91 U/L — ABNORMAL HIGH (ref 0–53)
AST: 65 U/L — ABNORMAL HIGH (ref 0–37)
Albumin: 3.6 g/dL (ref 3.5–5.2)
Alkaline Phosphatase: 76 U/L (ref 39–117)
CO2: 28 mEq/L (ref 19–32)
Chloride: 101 mEq/L (ref 96–112)
Creatinine, Ser: 0.8 mg/dL (ref 0.50–1.35)
GFR calc non Af Amer: >90 mL/min (ref >90)
Potassium: 4 mEq/L (ref 3.5–5.1)
Sodium: 138 mEq/L (ref 135–145)
Total Bilirubin: 0.9 mg/dL (ref 0.3–1.2)

## 2012-11-30 LAB — URINE MICROSCOPIC-ADD ON

## 2012-11-30 MED ORDER — KETOROLAC TROMETHAMINE 30 MG/ML IJ SOLN
30.0000 mg | Freq: Once | INTRAMUSCULAR | Status: AC
  Administered 2012-11-30: 30 mg via INTRAVENOUS
  Filled 2012-11-30: qty 1

## 2012-11-30 MED ORDER — MORPHINE SULFATE 4 MG/ML IJ SOLN
4.0000 mg | Freq: Once | INTRAMUSCULAR | Status: AC
  Administered 2012-11-30: 4 mg via INTRAVENOUS
  Filled 2012-11-30: qty 1

## 2012-11-30 MED ORDER — ONDANSETRON HCL 4 MG/2ML IJ SOLN: 4.0000 mg | Freq: Once | INTRAMUSCULAR | Status: DC

## 2012-11-30 MED ORDER — OXYCODONE-ACETAMINOPHEN 5-325 MG PO TABS: 2.0000 | ORAL_TABLET | ORAL | Status: DC | PRN

## 2012-11-30 MED ORDER — PROMETHAZINE HCL 25 MG/ML IJ SOLN
12.5000 mg | Freq: Once | INTRAMUSCULAR | Status: AC
  Administered 2012-11-30: 12.5 mg via INTRAVENOUS
  Filled 2012-11-30: qty 1

## 2012-11-30 NOTE — ED Notes (Signed)
Pt amb to room 4 with quick steady gait in nad. Pt reports his usual renal stone pain to right flank area radiating around to right lq and down his right leg. Some hematuria last night.

## 2012-11-30 NOTE — ED Notes (Signed)
Dr. Delo at the bedside 

## 2012-11-30 NOTE — ED Provider Notes (Signed)
CSN: [id number]     Arrival date & time 11/30/12  1043 History   First MD Initiated Contact with Patient 11/30/12 1100     Chief Complaint  Patient presents with  . Flank Pain   (Consider location/radiation/quality/duration/timing/severity/associated sxs/prior Treatment) HPI Comments: Patient is a 52 year old male with history of renal calculi. He presents with complaints of right flank pain that radiates to the right groin. This started yesterday morning and has worsened. 2 months ago he had an episode of a kidney stone in [place] and tells me the he had 2 large stones. This episode seemed to resolve but now his pain is returning. He denies any fevers or chills. He denies any bowel complaints. He does admit to decreased urine output over the past 24 hours.  Patient is a 52 y.o. male presenting with flank pain. The history is provided by the patient.  Flank Pain This is a recurrent problem. The current episode started yesterday. The problem occurs constantly. The problem has been rapidly worsening. Associated symptoms include abdominal pain. Nothing aggravates the symptoms. Nothing relieves the symptoms. He has tried nothing for the symptoms. The treatment provided no relief.    Past Medical History  Diagnosis Date  . Kidney stones   . Kidney stones   . Hypertension   . Diabetes mellitus   . Headache(784.0)     migraine history   Past Surgical History  Procedure Laterality Date  . Stents      kidney stones  . Cholecystectomy N/A 03/26/2012    Procedure: LAPAROSCOPIC CHOLECYSTECTOMY;  Surgeon: Romie Levee, MD;  Location: [facility];  Service: General;  Laterality: N/A;  . Liver biopsy N/A 03/26/2012    Procedure: LIVER BIOPSY;  Surgeon: Romie Levee, MD;  Location: [facility];  Service: General;  Laterality: N/A;   Family History  Problem Relation Age of Onset  . Cancer - Lung Mother   . Cancer Mother   . Seizures Sister   . Heart disease Father    History  Substance Use  Topics  . Smoking status: Current Every Day Smoker -- 1.50 packs/day    Types: Cigarettes  . Smokeless tobacco: Not on file  . Alcohol Use: No    Review of Systems  Gastrointestinal: Positive for abdominal pain.  Genitourinary: Positive for flank pain.  All other systems reviewed and are negative.    Allergies  Compazine  Home Medications   Current Outpatient Rx  Name  Route  Sig  Dispense  Refill  . aspirin 81 MG tablet   Oral   Take 81 mg by mouth daily as needed for pain.         . hydrochlorothiazide (HYDRODIURIL) 25 MG tablet   Oral   Take 25 mg by mouth every morning.         Marland Kitchen ibuprofen (ADVIL,MOTRIN) 200 MG tablet   Oral   Take 200-400 mg by mouth every 6 (six) hours as needed for pain.         . Multiple Vitamin (MULTIVITAMIN WITH MINERALS) TABS   Oral   Take 1 tablet by mouth daily.         . ondansetron (ZOFRAN) 4 MG tablet   Oral   Take 1 tablet (4 mg total) by mouth every 6 (six) hours.   12 tablet   0   . oxyCODONE-acetaminophen (PERCOCET/ROXICET) 5-325 MG per tablet   Oral   Take 2 tablets by mouth every 4 (four) hours as needed for pain.  20 tablet   0    BP 154/96  Pulse 88  Temp(Src) 98.4 F (36.9 C) (Oral)  Resp 20  Ht 6' (1.829 m)  Wt 274 lb (124.286 kg)  BMI 37.15 kg/m2  SpO2 98% Physical Exam  Nursing note and vitals reviewed. Constitutional: He is oriented to person, place, and time. He appears well-developed and well-nourished. No distress.  HENT:  Head: Normocephalic and atraumatic.  Mouth/Throat: Oropharynx is clear and moist.  Neck: Normal range of motion. Neck supple.  Cardiovascular: Normal rate, regular rhythm and normal heart sounds.   No murmur heard. Pulmonary/Chest: Effort normal and breath sounds normal. No respiratory distress. He has no wheezes.  Abdominal: Soft. Bowel sounds are normal. He exhibits no distension. There is no tenderness.  Musculoskeletal: Normal range of motion. He exhibits no edema.   Neurological: He is alert and oriented to person, place, and time.  Skin: Skin is warm and dry. He is not diaphoretic.    ED Course  Procedures (including critical care time) Labs Review Labs Reviewed  URINALYSIS, ROUTINE W REFLEX MICROSCOPIC  CBC WITH DIFFERENTIAL  COMPREHENSIVE METABOLIC PANEL   Imaging Review No results found.    MDM  No diagnosis found. 52 year old male who presents with right flank pain previous history of kidney stones and this feels the same. Workup reveals hematuria and CT scan reveals 2 moderate size calculi within the right ureter. I doubt these will pass on the round and have provided him with a followup information for [facility]. Will also prescribe pain medications for him to take in the meantime. If he develops fever or other problems he is to return to the ER.    Geoffery Lyons, MD 11/30/12 [phone]

## 2012-11-30 NOTE — ED Notes (Signed)
Patient transported to X-ray 

## 2012-11-30 NOTE — ED Notes (Signed)
Pt taken to CT.

## 2012-12-01 LAB — URINE CULTURE: Culture: NO GROWTH

## 2013-01-28 ENCOUNTER — Emergency Department: Payer: Self-pay

## 2013-01-28 ENCOUNTER — Emergency Department
Admission: EM | Admit: 2013-01-28 | Discharge: 2013-01-28 | Disposition: A | Discharge status: Home / Self Care | Payer: Self-pay | Attending: Emergency Medicine | Admitting: Emergency Medicine

## 2013-01-28 DIAGNOSIS — E119 Type 2 diabetes mellitus without complications: Secondary | ICD-10-CM | POA: Insufficient documentation

## 2013-01-28 DIAGNOSIS — N23 Unspecified renal colic: Secondary | ICD-10-CM | POA: Insufficient documentation

## 2013-01-28 DIAGNOSIS — I10 Essential (primary) hypertension: Secondary | ICD-10-CM | POA: Insufficient documentation

## 2013-01-28 DIAGNOSIS — F17200 Nicotine dependence, unspecified, uncomplicated: Secondary | ICD-10-CM | POA: Insufficient documentation

## 2013-01-28 HISTORY — DX: Nonalcoholic steatohepatitis (NASH): K75.81

## 2013-01-28 HISTORY — DX: Type 2 diabetes mellitus without complications: E11.9

## 2013-01-28 HISTORY — DX: Calculus of kidney: N20.0

## 2013-01-28 HISTORY — DX: Essential (primary) hypertension: I10

## 2013-01-28 LAB — URINALYSIS
Bilirubin, UA: NEGATIVE mg/dL
Glucose, UA: NEGATIVE mg/dL
Ketones UA: NEGATIVE mg/dL
Leukocyte Esterase, UA: NEGATIVE Leu/uL
Nitrite, UA: NEGATIVE
Protein, UR: 30 mg/dL — AB
RBC, UA: >182 /hpf — ABNORMAL HIGH (ref 0–4)
Urine Specific Gravity: 1.025 (ref 1.001–1.040)
Urobilinogen, UA: 4 mg/dL — AB
WBC, UA: 2 /hpf (ref 0–4)
pH, Urine: 5 pH (ref 5.0–8.0)

## 2013-01-28 LAB — CBC AND DIFFERENTIAL
Basophils %: 0.7 % (ref 0.0–3.0)
Basophils Absolute: 0.1 10*3/uL (ref 0.0–0.3)
Eosinophils %: 1 % (ref 0.0–7.0)
Eosinophils Absolute: 0.1 10*3/uL (ref 0.0–0.8)
Hematocrit: 53.2 % — ABNORMAL HIGH (ref 39.0–52.5)
Hemoglobin: 18.2 gm/dL — ABNORMAL HIGH (ref 13.0–17.5)
Lymphocytes Absolute: 1.9 10*3/uL (ref 0.6–5.1)
Lymphocytes: 15.3 % (ref 15.0–46.0)
MCH: 30 pg (ref 28–35)
MCHC: 34 gm/dL (ref 32–36)
MCV: 87 fL (ref 80–100)
MPV: 8.1 fL (ref 6.0–10.0)
Monocytes Absolute: 1.2 10*3/uL (ref 0.1–1.7)
Monocytes: 10.2 % (ref 3.0–15.0)
Neutrophils %: 72.9 % (ref 42.0–78.0)
Neutrophils Absolute: 8.8 10*3/uL — ABNORMAL HIGH (ref 1.7–8.6)
PLT CT: 126 10*3/uL — ABNORMAL LOW (ref 130–440)
RBC: 6.11 10*6/uL — ABNORMAL HIGH (ref 4.00–5.70)
RDW: 11.8 % (ref 11.0–14.0)
WBC: 12.1 10*3/uL — ABNORMAL HIGH (ref 4.0–11.0)

## 2013-01-28 LAB — I-STAT CHEM 8 CARTRIDGE
Anion Gap I-Stat: 18 — ABNORMAL HIGH (ref 7–16)
BUN I-Stat: 18 mg/dL (ref 6–20)
Calcium Ionized I-Stat: 4.8 mg/dL (ref 4.35–5.10)
Chloride I-Stat: 99 mMol/L (ref 98–112)
Creatinine I-Stat: 1.1 mg/dL (ref 0.90–1.30)
EGFR: >60 mL/min/{1.73_m2}
Glucose I-Stat: 148 mg/dL — ABNORMAL HIGH (ref 70–99)
Hematocrit I-Stat: 54 % — ABNORMAL HIGH (ref 39.0–52.5)
Hemoglobin I-Stat: 18.4 gm/dL — ABNORMAL HIGH (ref 13.0–17.5)
Potassium I-Stat: 4 mMol/L (ref 3.5–5.3)
Sodium I-Stat: 142 mMol/L (ref 135–145)
TCO2 I-Stat: 30 mMol/L — ABNORMAL HIGH (ref 24–29)

## 2013-01-28 LAB — VH I-STAT CHEM 8 NOTIFICATION

## 2013-01-28 MED ORDER — VH HYDROMORPHONE HCL PF 1 MG/ML CARPUJECT
1.0000 mg | Freq: Once | INTRAMUSCULAR | Status: AC
Start: 2013-01-28 — End: 2013-01-28
  Administered 2013-01-28: 1 mg via INTRAVENOUS

## 2013-01-28 MED ORDER — TAMSULOSIN HCL 0.4 MG PO CAPS
ORAL_CAPSULE | ORAL | Status: AC
Start: 2013-01-28 — End: ?
  Filled 2013-01-28: qty 1

## 2013-01-28 MED ORDER — PROMETHAZINE HCL 25 MG PO TABS
ORAL_TABLET | ORAL | Status: AC
Start: 2013-01-28 — End: ?
  Filled 2013-01-28: qty 4

## 2013-01-28 MED ORDER — HYDROCODONE-ACETAMINOPHEN 7.5-325 MG PO TABS
1.0000 | ORAL_TABLET | Freq: Four times a day (QID) | ORAL | Status: AC | PRN
Start: 2013-01-28 — End: 2013-02-02

## 2013-01-28 MED ORDER — TAMSULOSIN HCL 0.4 MG PO CAPS
0.4000 mg | ORAL_CAPSULE | Freq: Every evening | ORAL | Status: AC
Start: 2013-01-28 — End: ?

## 2013-01-28 MED ORDER — KETOROLAC TROMETHAMINE 30 MG/ML IJ SOLN
30.0000 mg | Freq: Once | INTRAMUSCULAR | Status: AC
Start: 2013-01-28 — End: 2013-01-28
  Administered 2013-01-28: 30 mg via INTRAVENOUS

## 2013-01-28 MED ORDER — PROMETHAZINE HCL 25 MG PO TABS
25.0000 mg | ORAL_TABLET | Freq: Four times a day (QID) | ORAL | Status: AC | PRN
Start: 2013-01-28 — End: 2013-02-02

## 2013-01-28 MED ORDER — PROMETHAZINE HCL 25 MG/ML IJ SOLN
12.5000 mg | Freq: Once | INTRAMUSCULAR | Status: AC
Start: 2013-01-28 — End: 2013-01-28
  Administered 2013-01-28: 12.5 mg via INTRAVENOUS

## 2013-01-28 MED ORDER — HYDROCODONE-ACETAMINOPHEN 7.5-325 MG PO TABS
1.0000 | ORAL_TABLET | Freq: Four times a day (QID) | ORAL | Status: DC | PRN
Start: 2013-01-28 — End: 2013-01-29
  Administered 2013-01-28: 1 via ORAL

## 2013-01-28 MED ORDER — VH HYDROMORPHONE HCL 1 MG/ML (NARRATOR)
INTRAMUSCULAR | Status: AC
Start: 2013-01-28 — End: ?
  Filled 2013-01-28: qty 1

## 2013-01-28 MED ORDER — KETOROLAC TROMETHAMINE 30 MG/ML IJ SOLN
INTRAMUSCULAR | Status: AC
Start: 2013-01-28 — End: ?
  Filled 2013-01-28: qty 1

## 2013-01-28 MED ORDER — SODIUM CHLORIDE 0.9 % IV SOLN
INTRAVENOUS | Status: DC
Start: 2013-01-28 — End: 2013-01-29

## 2013-01-28 MED ORDER — HYDROCODONE-ACETAMINOPHEN 7.5-325 MG PO TABS
ORAL_TABLET | ORAL | Status: AC
Start: 2013-01-28 — End: ?
  Filled 2013-01-28: qty 4

## 2013-01-28 MED ORDER — TAMSULOSIN HCL 0.4 MG PO CAPS
0.4000 mg | ORAL_CAPSULE | ORAL | Status: DC
Start: 2013-01-29 — End: 2013-01-29
  Administered 2013-01-28: 0.4 mg via ORAL

## 2013-01-28 MED ORDER — PROMETHAZINE HCL 25 MG/ML IJ SOLN
INTRAMUSCULAR | Status: AC
Start: 2013-01-28 — End: ?
  Filled 2013-01-28: qty 1

## 2013-01-28 NOTE — ED Provider Notes (Addendum)
Physician/Midlevel provider first contact with patient: 01/28/13 1925         History     Chief Complaint   Patient presents with   . Flank Pain   . Hematuria     HPI  Pt complains of sudden onset of right flank pain with radiation to right groin at noon while driving to [place].  He complains of pain worsening progressively where he was concerned he would pass out.  He rates his pain currently as 8.5-9/10 " like someone beat me with a hammer" and notes chills but no fever with the pain and dark colored urine.  He states he has had large prior stones requiring lithotripsy and multiple serial stenting procedures at [facility].    Past Medical History   Diagnosis Date   . Diabetes mellitus      diet controlled   . Hypertension    . Calculus of kidney    . NASH (nonalcoholic steatohepatitis)        Past Surgical History   Procedure Date   . Cholecystectomy    . Kidney stone surgery    Multiple lithotripsies and prolonged ureteral stenting.    Family History   Problem Relation Age of Onset   . Heart disease Father    . Heart disease Sister        Social  History   Substance Use Topics   . Smoking status: Current Every Day Smoker   . Smokeless tobacco: Never Used   . Alcohol Use: No       .     Allergies   Allergen Reactions   . Zofran (Ondansetron)    . Compazine (Prochlorperazine)        Current/Home Medications    No medications on file        Review of Systems   Constitutional: Positive for chills. Negative for fever.   Respiratory: Negative for cough and shortness of breath.    Gastrointestinal: Positive for vomiting, abdominal pain and diarrhea.   Genitourinary: Positive for hematuria and flank pain. Negative for dysuria, decreased urine volume and difficulty urinating.   Musculoskeletal: Positive for back pain.   Skin: Negative.    Neurological: Positive for light-headedness.       Physical Exam    BP: 166/91 mmHg, Heart Rate: 78 , Temp: 98.8 F (37.1 C), Resp Rate: 20 , SpO2: 99 %, Weight: 128.822  kg    Physical Exam   Nursing note and vitals reviewed.  Constitutional: He is oriented to person, place, and time. He appears well-developed and well-nourished. He appears distressed.        53 yr old obese white male in moderate-severe pain due to right sided flank pain.   Eyes: Conjunctivae normal are normal. Pupils are equal, round, and reactive to light.   Cardiovascular: Normal rate, regular rhythm and normal heart sounds.    No murmur heard.  Pulmonary/Chest: Effort normal and breath sounds normal. No respiratory distress.   Abdominal: Soft. He exhibits no distension and no mass. There is tenderness. There is no rebound and no guarding.        Moderate obesity, decreased bowel sounds diffusely with mild-moderate tenderness RLQ.   Genitourinary: Penis normal.        Circumcised, no discharge nor bleeding.   Musculoskeletal: Normal range of motion.   Neurological: He is alert and oriented to person, place, and time.   Skin: Skin is warm and dry. No rash noted. No pallor.  Psychiatric: He has a normal mood and affect. His behavior is normal. Judgment and thought content normal.       MDM and ED Course     ED Medication Orders      Start     Status Ordering Provider    01/29/13 0830   tamsulosin (FLOMAX) capsule 0.4 mg   After breakfast      Route: Oral  Ordered Dose: 0.4 mg         Last MAR action:  Given Mailynn Everly A    01/28/13 2218   HYDROcodone-acetaminophen (NORCO) 7.5-325 MG per tablet 1 tablet   Every 6 hours PRN      Route: Oral  Ordered Dose: 1 tablet         Last MAR action:  Given Nicol Herbig A    01/28/13 2114   HYDROmorphone (DILAUDID) injection 1 mg   Once in ED      Route: Intravenous  Ordered Dose: 1 mg         Last MAR action:  Given Aamiyah Derrick A    01/28/13 1934   ketorolac (TORADOL) injection 30 mg   Once in ED      Route: Intravenous  Ordered Dose: 30 mg         Last MAR action:  Given Santina Trillo A    01/28/13 1934   promethazine (PHENERGAN) injection 12.5 mg    Once in ED      Route: Intravenous  Ordered Dose: 12.5 mg         Last MAR action:  Given Varina Hulon A    01/28/13 1934   HYDROmorphone (DILAUDID) injection 1 mg   Once in ED      Route: Intravenous  Ordered Dose: 1 mg         Last MAR action:  Given Zaide Mcclenahan A    01/28/13 1934   0.9%  NaCl infusion   Continuous      Route: Intravenous         Last MAR action:  Stopped Hence Derrick A               Results for orders placed during the hospital encounter of 01/28/13   CBC AND DIFFERENTIAL       Component Value Range    WBC 12.1 (*) 4.0 - 11.0 K/cmm    RBC 6.11 (*) 4.00 - 5.70 M/cmm    Hemoglobin 18.2 (*) 13.0 - 17.5 gm/dL    Hematocrit 81.1 (*) 39.0 - 52.5 %    MCV 87  80 - 100 fL    MCH 30  28 - 35 pg    MCHC 34  32 - 36 gm/dL    RDW 91.4  78.2 - 95.6 %    PLT CT 126 (*) 130 - 440 K/cmm    MPV 8.1  6.0 - 10.0 fL    NEUTROPHIL % 72.9  42.0 - 78.0 %    Lymphocytes 15.3  15.0 - 46.0 %    Monocytes 10.2  3.0 - 15.0 %    Eosinophils % 1.0  0.0 - 7.0 %    Basophils % 0.7  0.0 - 3.0 %    Neutrophils Absolute 8.8 (*) 1.7 - 8.6 K/cmm    Lymphocytes Absolute 1.9  0.6 - 5.1 K/cmm    Monocytes Absolute 1.2  0.1 - 1.7 K/cmm    Eosinophils Absolute 0.1  0.0 - 0.8 K/cmm    BASO Absolute 0.1  0.0 - 0.3 K/cmm   VH I-STAT CHEM 8 NOTIFICATION       Component Value Range    I-STAT Notification Istat Notification     URINALYSIS       Component Value Range    Color, UA Yellow  Colorless,Yellow,Straw    Clarity, UA Slightly Cloudy (*) Clear    Specific Gravity, UR 1.025  1.001 - 1.040    pH, Urine 5.0  5.0 - 8.0 pH    Protein, UR 30 (*) Negative mg/dL    Glucose, UA Negative  Negative mg/dL    Ketones UA Negative  Negative,5 mg/dL    Bilirubin, UA Negative  Negative mg/dL    Blood, UA Large (*) Negative mg/dL    Nitrite, UA Negative  Negative    Urobilinogen, UA 4.0 (*) Normal mg/dL    Leukocyte Esterase, UA Negative  Negative Leu/uL    WBC, UA 2  0 - 4 /hpf    RBC, UA >182 (*) 0 - 4 /hpf    Bacteria, UA Rare (*) None  /hpf   I-STAT CHEM 8 CARTRIDGE       Component Value Range    i-STAT Sodium 142  135 - 145 mMol/L    i-STAT Potassium 4.0  3.5 - 5.3 mMol/L    i-STAT Chloride 99  98 - 112 mMol/L    TCO2, ISTAT 30 (*) 24 - 29 mMol/L    Ionized Ca, ISTAT 4.80  4.35 - 5.10 mg/dL    i-STAT Glucose 161 (*) 70 - 99 mg/dL    i-STAT Creatinine 0.96  0.90 - 1.30 mg/dL    i-STAT BUN 18  6 - 20 mg/dL    Anion Gap, ISTAT 04.5 (*) 7 - 16    EGFR >60      i-STAT Hematocrit 54.0 (*) 39.0 - 52.5 %    i-STAT Hemoglobin 18.4 (*) 13.0 - 17.5 gm/dL       MDM:  Diagnostic considerations include, but are not limited to: renal colic, ureteral obstruction, UTI. Doubt AAA, or appendicitis with clinical history.    Results for orders placed during the hospital encounter of 01/28/13   CT ABDOMEN PELVIS WO IV/ WO PO CONT    Narrative:     Clinical History:  Right flank pain and hematuria. History of kidney stones.    Examination:  CT of the abdomen and pelvis without contrast. Multiplanar reconstructions obtained.    Comparison:  None available.    Findings:  The exam is technically limited by lack of intravenous contrast.    Lower chest:  The lung bases are clear. No pleural effusions.    Abdomen:  Liver normal size, contour and density. No mass or biliary dilatation.    Gallbladder surgically absent.    Pancreas, Spleen and Adrenal glands normal.    Right kidney with marked hydronephrosis and perinephric stranding. There are 2 small calculi at the mid right ureter the  larger measuring approximately 5 mm. Right ureter decompressed below the calculus. Left kidney normal size and contour.  No hydronephrosis, mass or stones.    The stomach, duodenum and small bowel are normal in caliber without inflammatory changes.    Colon normal caliber and wall thickness without inflammation or mass.    No mass, pathologic adenopathy or free fluid. The aorta and IVC are normal caliber.    Pelvis:  Prostate and bladder unremarkable.    Bones and Soft  Tissues:  Unremarkable.      Impression:  2 small calculi in the mid right ureter causing marked right hydronephrosis    ReadingStation:[id number]     9:14 PM  Recheck pain is 7.5/10,  Has not voided as yet, has received approximately 700cc of IV NS,  Will redose Dilaudid and encourage him to void.  10:15 PM  Pain is now 6/10  Patient is willing to go to hotel for tonight and his wife is driving up from [place] to pick him up and  Take him back to [place] to [facility].  We will discharge him with medications to go for his comfort tonight.  He doesn't appear to be septic nor toxic.  Procedures    Clinical Impression & Disposition     Clinical Impression  Final diagnoses:   Renal colic on right side        ED Disposition     Discharge Dimple Nanas discharge to home/self care.    Condition at disposition: Stable             New Prescriptions    HYDROCODONE-ACETAMINOPHEN (NORCO) 7.5-325 MG PER TABLET    Take 1 tablet by mouth every 6 (six) hours as needed for Pain.    PROMETHAZINE (PHENERGAN) 25 MG TABLET    Take 1 tablet (25 mg total) by mouth every 6 (six) hours as needed for Nausea.    TAMSULOSIN (FLOMAX) 0.4 MG CAP    Take 1 capsule (0.4 mg total) by mouth nightly. Take until stone passes               I have reviewed all medical history, surgical history, family history, current medications, and known allergies with the patient. In addition, I have discussed the results of all laboratory and imaging results relevant to the diagnosis with the patient.  All relevant and clinical information was transcribed by me, Jacob Moores, acting as a scribe for Dr. Eddie Candle.      Valene Bors A, DO  01/28/13 2231    Valene Bors A, DO  01/28/13 2232

## 2013-01-28 NOTE — ED Notes (Signed)
Pt w/ hx kidney stones; started w/ R flank pain today at noon; blood in urine; traveling back home

## 2013-01-28 NOTE — Discharge Instructions (Signed)
Kidney Stone (W/ Colic)    The sharp cramping pain and nausea/vomiting that you have is due to a small stone which has formed in the kidney and is now passing down a narrow tube (ureter) on its way to your bladder. Once it reaches your bladder, the pain will stop. The stone may pass in your urine stream in one piece. [The size may be 1/16" to 1/4" (1-6mm)]. Or, the stone may also break up into sandy fragments which you may not even notice.  Once you have had a kidney stone, you are at risk for developing another one in the future.  Home Care:   Drink plenty of fluids (at least 8 to 10 glasses of water a day).   Most stones will pass on their own, but may take from a few hours to a few days. Sometimes the stone is too large to pass by itself and special methods will have to be used to remove the stone.   Each time you urinate, do so in a jar. Pour the urine from the jar through the strainer and into the toilet. Continue doing this until 24 hours after your pain stops. By then, if there was a kidney stone, it should pass from your bladder. Some stones dissolve into sand-like particles and pass right through the strainer. In that case, you won't ever see a stone.   Save any stone that you find in the strainer and bring it to your doctor for analysis. It may be possible to prevent certain types of stones from forming. Therefore, it is important to know what kind of stone you have.   Try to stay as active as possible since this will help the stone pass. Do not stay in bed unless your pain prevents you from getting up. You may notice a red, pink or brown color to your urine. This is normal while passing a kidney stone.  Follow Up  with your doctor or return to this facility if the pain lasts more than 48 hours.  Get Prompt Medical Attention  if any of the following occur:   Pain that is not controlled by the medicine given   Repeated vomiting or unable to keep down fluids   Weakness, dizziness or  fainting   Fever of 100.4F (38C) or higher, or as directed by your healthcare provider   Passage of solid red or brown urine (can't see through it) or urine with lots of blood clots   Unable to pass urine for 8 hours and increasing bladder pressure   2000-2014 [employer], [address], [place] [address]. All rights reserved. This information is not intended as a substitute for professional medical care. Always follow your healthcare professional's instructions.

## 2013-04-17 ENCOUNTER — Emergency Department (HOSPITAL_BASED_OUTPATIENT_CLINIC_OR_DEPARTMENT_OTHER): Payer: Medicaid Other

## 2013-04-17 ENCOUNTER — Emergency Department (HOSPITAL_BASED_OUTPATIENT_CLINIC_OR_DEPARTMENT_OTHER)
Admission: EM | Admit: 2013-04-17 | Discharge: 2013-04-18 | Disposition: A | Discharge status: Home / Self Care | Payer: Medicaid Other | Attending: Emergency Medicine | Admitting: Emergency Medicine

## 2013-04-17 ENCOUNTER — Encounter (HOSPITAL_BASED_OUTPATIENT_CLINIC_OR_DEPARTMENT_OTHER): Payer: Self-pay | Admitting: Emergency Medicine

## 2013-04-17 DIAGNOSIS — F17200 Nicotine dependence, unspecified, uncomplicated: Secondary | ICD-10-CM | POA: Insufficient documentation

## 2013-04-17 DIAGNOSIS — K766 Portal hypertension: Secondary | ICD-10-CM

## 2013-04-17 DIAGNOSIS — E119 Type 2 diabetes mellitus without complications: Secondary | ICD-10-CM | POA: Insufficient documentation

## 2013-04-17 DIAGNOSIS — N23 Unspecified renal colic: Secondary | ICD-10-CM | POA: Insufficient documentation

## 2013-04-17 DIAGNOSIS — R1011 Right upper quadrant pain: Secondary | ICD-10-CM | POA: Insufficient documentation

## 2013-04-17 DIAGNOSIS — I10 Essential (primary) hypertension: Secondary | ICD-10-CM | POA: Insufficient documentation

## 2013-04-17 DIAGNOSIS — Z7982 Long term (current) use of aspirin: Secondary | ICD-10-CM | POA: Insufficient documentation

## 2013-04-17 DIAGNOSIS — Z87442 Personal history of urinary calculi: Secondary | ICD-10-CM | POA: Insufficient documentation

## 2013-04-17 LAB — COMPREHENSIVE METABOLIC PANEL
ALBUMIN: 3.8 g/dL (ref 3.5–5.2)
ALT: 87 U/L — ABNORMAL HIGH (ref 0–53)
AST: 71 U/L — ABNORMAL HIGH (ref 0–37)
Alkaline Phosphatase: 71 U/L (ref 39–117)
BUN: 18 mg/dL (ref 6–23)
CALCIUM: 9.9 mg/dL (ref 8.4–10.5)
CO2: 29 mEq/L (ref 19–32)
CREATININE: 0.9 mg/dL (ref 0.50–1.35)
Chloride: 100 mEq/L (ref 96–112)
GFR calc Af Amer: >90 mL/min (ref >90)
GFR calc non Af Amer: >90 mL/min (ref >90)
Glucose, Bld: 138 mg/dL — ABNORMAL HIGH (ref 70–99)
POTASSIUM: 3.7 meq/L (ref 3.7–5.3)
Sodium: 141 mEq/L (ref 137–147)
TOTAL PROTEIN: 7.2 g/dL (ref 6.0–8.3)
Total Bilirubin: 0.7 mg/dL (ref 0.3–1.2)

## 2013-04-17 LAB — URINALYSIS, ROUTINE W REFLEX MICROSCOPIC
Bilirubin Urine: NEGATIVE
Glucose, UA: NEGATIVE mg/dL
Ketones, ur: NEGATIVE mg/dL
Leukocytes, UA: NEGATIVE
Nitrite: NEGATIVE
Protein, ur: NEGATIVE mg/dL
Specific Gravity, Urine: 1.021 (ref 1.005–1.030)
Urobilinogen, UA: 2 mg/dL — ABNORMAL HIGH (ref 0.0–1.0)
pH: 6 (ref 5.0–8.0)

## 2013-04-17 LAB — CBC WITH DIFFERENTIAL/PLATELET
Basophils Absolute: 0 10*3/uL (ref 0.0–0.1)
Basophils Relative: 0 % (ref 0–1)
Eosinophils Absolute: 0.2 10*3/uL (ref 0.0–0.7)
Eosinophils Relative: 3 % (ref 0–5)
HCT: 45.4 % (ref 39.0–52.0)
Hemoglobin: 15.7 g/dL (ref 13.0–17.0)
Lymphocytes Relative: 35 % (ref 12–46)
Lymphs Abs: 2.4 10*3/uL (ref 0.7–4.0)
MCH: 29.9 pg (ref 26.0–34.0)
MCHC: 34.6 g/dL (ref 30.0–36.0)
MCV: 86.5 fL (ref 78.0–100.0)
Monocytes Absolute: 0.8 10*3/uL (ref 0.1–1.0)
Monocytes Relative: 11 % (ref 3–12)
Neutro Abs: 3.5 10*3/uL (ref 1.7–7.7)
Neutrophils Relative %: 50 % (ref 43–77)
Platelets: 104 10*3/uL — ABNORMAL LOW (ref 150–400)
RBC: 5.25 MIL/uL (ref 4.22–5.81)
RDW: 13.2 % (ref 11.5–15.5)
WBC: 6.9 10*3/uL (ref 4.0–10.5)

## 2013-04-17 LAB — LIPASE, BLOOD: Lipase: 35 U/L (ref 11–59)

## 2013-04-17 LAB — URINE MICROSCOPIC-ADD ON

## 2013-04-17 MED ORDER — KETOROLAC TROMETHAMINE 15 MG/ML IJ SOLN
15.0000 mg | Freq: Once | INTRAMUSCULAR | Status: AC
  Administered 2013-04-17: 15 mg via INTRAVENOUS
  Filled 2013-04-17: qty 1

## 2013-04-17 MED ORDER — PROMETHAZINE HCL 25 MG/ML IJ SOLN
12.5000 mg | Freq: Once | INTRAMUSCULAR | Status: AC
  Administered 2013-04-17: 12.5 mg via INTRAVENOUS
  Filled 2013-04-17: qty 1

## 2013-04-17 MED ORDER — SODIUM CHLORIDE 0.9 % IV SOLN
INTRAVENOUS | Status: DC
  Administered 2013-04-17: 23:00:00 via INTRAVENOUS

## 2013-04-17 MED ORDER — HYDROMORPHONE HCL PF 1 MG/ML IJ SOLN
1.0000 mg | Freq: Once | INTRAMUSCULAR | Status: AC
  Administered 2013-04-17: 1 mg via INTRAVENOUS
  Filled 2013-04-17: qty 1

## 2013-04-17 NOTE — ED Notes (Signed)
Right flank pain x 2 weeks  Hx of kidney stones

## 2013-04-17 NOTE — ED Provider Notes (Addendum)
CSN: [id number]     Arrival date & time 04/17/13  2235 History  This chart was scribed for Hanley SeamenJohn L Wai Litt, MD by Dorothey Basemania Sutton, ED Scribe. This patient was seen in room MH09/MH09 and the patient's care was started at 10:52 PM.    Chief Complaint  Patient presents with  . Flank Pain   The history is provided by the patient. No language interpreter was used.   HPI Comments: Michael Carroll is a 53 y.o. Male with a history of nephrolithiasis (patient received a CT last year that was positive for right-sided kidney stones) and non-alcohol related cirrhosis who presents to the Emergency Department complaining of a waxing and waning pain to the right flank with associated dysuria, hematuria, nausea onset about a week ago. Patient reports that he passed 2 kidney stones about 1.5 weeks ago. He reports some associated pain to the epigastric region of the abdomen. He denies diarrhea, vomiting, fever, chills, chest pain, shortness of breath. Patient has a surgical history of cholecystectomy. Patient also has a history of HTN and DM.   Past Medical History  Diagnosis Date  . Hypertension   . Diabetes mellitus   . Headache(784.0)     migraine history  . Cirrhosis of liver without mention of alcohol   . Kidney stones    Past Surgical History  Procedure Laterality Date  . Stents      kidney stones  . Cholecystectomy N/A 03/26/2012    Procedure: LAPAROSCOPIC CHOLECYSTECTOMY;  Surgeon: Romie LeveeAlicia Thomas, MD;  Location: [facility];  Service: General;  Laterality: N/A;  . Liver biopsy N/A 03/26/2012    Procedure: LIVER BIOPSY;  Surgeon: Romie LeveeAlicia Thomas, MD;  Location: [facility];  Service: General;  Laterality: N/A;   Family History  Problem Relation Age of Onset  . Cancer - Lung Mother   . Cancer Mother   . Seizures Sister   . Heart disease Father    History  Substance Use Topics  . Smoking status: Current Every Day Smoker -- 1.50 packs/day    Types: Cigarettes  . Smokeless tobacco: Not on file  . Alcohol Use:  No    Review of Systems  A complete 10 system review of systems was obtained and all systems are negative except as noted in the HPI and PMH.    Allergies  Compazine  Home Medications   Current Outpatient Rx  Name  Route  Sig  Dispense  Refill  . aspirin 81 MG tablet   Oral   Take 81 mg by mouth daily as needed for pain.         . hydrochlorothiazide (HYDRODIURIL) 25 MG tablet   Oral   Take 25 mg by mouth every morning.         Marland Kitchen. ibuprofen (ADVIL,MOTRIN) 200 MG tablet   Oral   Take 200-400 mg by mouth every 6 (six) hours as needed for pain.         . Multiple Vitamin (MULTIVITAMIN WITH MINERALS) TABS   Oral   Take 1 tablet by mouth daily.         . ondansetron (ZOFRAN) 4 MG tablet   Oral   Take 1 tablet (4 mg total) by mouth every 6 (six) hours.   12 tablet   0   . oxyCODONE-acetaminophen (PERCOCET) 5-325 MG per tablet   Oral   Take 2 tablets by mouth every 4 (four) hours as needed.   25 tablet   0   . oxyCODONE-acetaminophen (PERCOCET/ROXICET) 5-325  MG per tablet   Oral   Take 2 tablets by mouth every 4 (four) hours as needed for pain.   20 tablet   0    Triage Vitals: BP 187/103  Pulse 89  Temp(Src) 98.8 F (37.1 C) (Oral)  Resp 18  Ht 6' (1.829 m)  Wt 263 lb (119.296 kg)  BMI 35.66 kg/m2  SpO2 98%  Physical Exam  Nursing note and vitals reviewed. Constitutional: He is oriented to person, place, and time. He appears well-developed and well-nourished. No distress.  HENT:  Head: Normocephalic and atraumatic.  Eyes: Conjunctivae and EOM are normal. Pupils are equal, round, and reactive to light. No scleral icterus.  Neck: Normal range of motion. Neck supple.  Cardiovascular: Normal rate, regular rhythm and normal heart sounds.   Pulmonary/Chest: Effort normal. No respiratory distress. He has wheezes.  Faint expiratory wheezing on the right.   Abdominal: Soft. He exhibits no distension. There is tenderness.  Tenderness to palpation to the  RUQ and LUQ with some mild hepatomegaly.   Musculoskeletal: Normal range of motion.  Neurological: He is alert and oriented to person, place, and time.  Skin: Skin is warm and dry.  Psychiatric: He has a normal mood and affect. His behavior is normal.    ED Course  Procedures (including critical care time)  DIAGNOSTIC STUDIES: Oxygen Saturation is 98% on room air, normal by my interpretation.    COORDINATION OF CARE: 10:57 PM- Ordered UA. Will order a CT of the abdomen, CBC, CMP, and lipase based on patient's increased complexity of his current complaints based on his history of nephrolithiasis and cirrhosis. Will order pain medication to manage symptoms. Discussed treatment plan with patient at bedside and patient verbalized agreement.    MDM   Nursing notes and vitals signs, including pulse oximetry, reviewed.  Summary of this visit's results, reviewed by myself:  Labs:  Results for orders placed during the hospital encounter of 04/17/13 (from the past 24 hour(s))  URINALYSIS, ROUTINE W REFLEX MICROSCOPIC     Status: Abnormal   Collection Time    04/17/13 10:38 PM      Result Value Ref Range   Color, Urine YELLOW  YELLOW   APPearance CLEAR  CLEAR   Specific Gravity, Urine 1.021  1.005 - 1.030   pH 6.0  5.0 - 8.0   Glucose, UA NEGATIVE  NEGATIVE mg/dL   Hgb urine dipstick LARGE (*) NEGATIVE   Bilirubin Urine NEGATIVE  NEGATIVE   Ketones, ur NEGATIVE  NEGATIVE mg/dL   Protein, ur NEGATIVE  NEGATIVE mg/dL   Urobilinogen, UA 2.0 (*) 0.0 - 1.0 mg/dL   Nitrite NEGATIVE  NEGATIVE   Leukocytes, UA NEGATIVE  NEGATIVE  URINE MICROSCOPIC-ADD ON     Status: None   Collection Time    04/17/13 10:38 PM      Result Value Ref Range   Squamous Epithelial / LPF RARE  RARE   RBC / HPF TOO NUMEROUS TO COUNT  <3 RBC/hpf   Bacteria, UA RARE  RARE  CBC WITH DIFFERENTIAL     Status: Abnormal   Collection Time    04/17/13 11:21 PM      Result Value Ref Range   WBC 6.9  4.0 - 10.5 K/uL    RBC 5.25  4.22 - 5.81 MIL/uL   Hemoglobin 15.7  13.0 - 17.0 g/dL   HCT 40.9  81.1 - 91.4 %   MCV 86.5  78.0 - 100.0 fL   MCH 29.9  26.0 - 34.0 pg   MCHC 34.6  30.0 - 36.0 g/dL   RDW 24.4  01.0 - 27.2 %   Platelets 104 (*) 150 - 400 K/uL   Neutrophils Relative % 50  43 - 77 %   Neutro Abs 3.5  1.7 - 7.7 K/uL   Lymphocytes Relative 35  12 - 46 %   Lymphs Abs 2.4  0.7 - 4.0 K/uL   Monocytes Relative 11  3 - 12 %   Monocytes Absolute 0.8  0.1 - 1.0 K/uL   Eosinophils Relative 3  0 - 5 %   Eosinophils Absolute 0.2  0.0 - 0.7 K/uL   Basophils Relative 0  0 - 1 %   Basophils Absolute 0.0  0.0 - 0.1 K/uL   WBC Morphology WHITE COUNT CONFIRMED ON SMEAR     Smear Review PLATELET COUNT CONFIRMED BY SMEAR    COMPREHENSIVE METABOLIC PANEL     Status: Abnormal   Collection Time    04/17/13 11:21 PM      Result Value Ref Range   Sodium 141  137 - 147 mEq/L   Potassium 3.7  3.7 - 5.3 mEq/L   Chloride 100  96 - 112 mEq/L   CO2 29  19 - 32 mEq/L   Glucose, Bld 138 (*) 70 - 99 mg/dL   BUN 18  6 - 23 mg/dL   Creatinine, Ser 5.36  0.50 - 1.35 mg/dL   Calcium 9.9  8.4 - 64.4 mg/dL   Total Protein 7.2  6.0 - 8.3 g/dL   Albumin 3.8  3.5 - 5.2 g/dL   AST 71 (*) 0 - 37 U/L   ALT 87 (*) 0 - 53 U/L   Alkaline Phosphatase 71  39 - 117 U/L   Total Bilirubin 0.7  0.3 - 1.2 mg/dL   GFR calc non Af Amer >90  >90 mL/min   GFR calc Af Amer >90  >90 mL/min  LIPASE, BLOOD     Status: None   Collection Time    04/17/13 11:21 PM      Result Value Ref Range   Lipase 35  11 - 59 U/L    Imaging Studies: Ct Abdomen Pelvis Wo Contrast  04/17/2013   CLINICAL DATA:  Right flank pain, hematuria  EXAM: CT ABDOMEN AND PELVIS WITHOUT CONTRAST  TECHNIQUE: Multidetector CT imaging of the abdomen and pelvis was performed following the standard protocol without intravenous contrast.  COMPARISON:  Prior CT abdomen/ pelvis 11/30/2012  FINDINGS: Lower Chest: The lung bases are clear. Visualized cardiac structures are within  normal limits for size. No pericardial effusion. Unremarkable visualized distal thoracic esophagus.  Abdomen: Unenhanced CT was performed per clinician order. Lack of IV contrast limits sensitivity and specificity, especially for evaluation of abdominal/pelvic solid viscera. Within these limitations, unremarkable CT appearance of the stomach, duodenum, adrenal glands and pancreas. Abnormal hepatic morphology with marked enlargement of the caudate lobe and a suggestion of a finely nodular contour suggesting underlying hepatic cirrhosis. The gallbladder is surgically absent. No intra or extrahepatic biliary ductal dilatation. Stable 1.8 cm portacaval node. Borderline splenomegaly.  Moderate right hydronephrosis. There is a 5 mm stone in the lower pole. Obstructing 6 x 6 mm stone in the distal ureter just proximal to the iliac vessels. There is associated ureterectasis and periureteral stranding.  No evidence of obstruction or focal bowel wall thickening. Normal appendix in the right lower quadrant. The terminal ileum is unremarkable. No free fluid or additional adenopathy.  Pelvis: Unremarkable bladder, prostate gland and seminal vesicles. No free fluid or suspicious adenopathy.  Bones/Soft Tissues: No acute fracture or aggressive appearing lytic or blastic osseous lesion.  Vascular: Limited evaluation in the absence of intravenous contrast. Trace aortic atherosclerotic vascular calcifications. No aneurysmal dilatation.  IMPRESSION: 1. Obstructing 6 mm distal right ureteral stone with associated moderate right hydronephrosis. 2. An additional nonobstructing 5 mm stone is noted in the right lower pole. 3. Hepatic cirrhosis. Borderline splenomegaly suggests developing portal hypertension.   Electronically Signed   By: Malachy Moan M.D.   On: 04/17/2013 23:46   2:02 AM Significantly improved after IV medications. The previously passed stones that he brought with him have been sent for pathological analysis. We  will refer to urology as he does not currently have a urologist. He was advised to the CT finding suggesting portal hypertension and the need for him to establish with a primary care physician. We will provide referral.   I personally performed the services described in this documentation, which was scribed in my presence. The recorded information has been reviewed and is accurate.    Hanley Seamen, MD 04/18/13 2130  Hanley Seamen, MD 04/18/13 [phone]

## 2013-04-18 MED ORDER — TAMSULOSIN HCL 0.4 MG PO CAPS
ORAL_CAPSULE | ORAL | Status: DC
Start: 2013-04-18 — End: 2013-05-15

## 2013-04-18 MED ORDER — TAMSULOSIN HCL 0.4 MG PO CAPS
0.4000 mg | ORAL_CAPSULE | Freq: Once | ORAL | Status: AC
  Administered 2013-04-18: 0.4 mg via ORAL
  Filled 2013-04-18: qty 1

## 2013-04-18 MED ORDER — NAPROXEN SODIUM 220 MG PO TABS
ORAL_TABLET | ORAL | Status: DC
Start: 2013-04-18 — End: 2013-05-15

## 2013-04-18 MED ORDER — HYDROMORPHONE HCL PF 1 MG/ML IJ SOLN
1.0000 mg | Freq: Once | INTRAMUSCULAR | Status: AC
  Administered 2013-04-18: 1 mg via INTRAVENOUS
  Filled 2013-04-18: qty 1

## 2013-04-18 MED ORDER — HYDROMORPHONE HCL 4 MG PO TABS
4.0000 mg | ORAL_TABLET | ORAL | Status: DC | PRN
Start: 2013-04-18 — End: 2013-05-15

## 2013-04-18 NOTE — ED Notes (Signed)
Pt waiting on ride

## 2013-04-18 NOTE — Discharge Instructions (Signed)
Kidney Stones  Kidney stones (urolithiasis) are deposits that form inside your kidneys. The intense pain is caused by the stone moving through the urinary tract. When the stone moves, the ureter goes into spasm around the stone. The stone is usually passed in the urine.   CAUSES   · A disorder that makes certain neck glands produce too much parathyroid hormone (primary hyperparathyroidism).  · A buildup of uric acid crystals, similar to gout in your joints.  · Narrowing (stricture) of the ureter.  · A kidney obstruction present at birth (congenital obstruction).  · Previous surgery on the kidney or ureters.  · Numerous kidney infections.  SYMPTOMS   · Feeling sick to your stomach (nauseous).  · Throwing up (vomiting).  · Blood in the urine (hematuria).  · Pain that usually spreads (radiates) to the groin.  · Frequency or urgency of urination.  DIAGNOSIS   · Taking a history and physical exam.  · Blood or urine tests.  · CT scan.  · Occasionally, an examination of the inside of the urinary bladder (cystoscopy) is performed.  TREATMENT   · Observation.  · Increasing your fluid intake.  · Extracorporeal shock wave lithotripsy This is a noninvasive procedure that uses shock waves to break up kidney stones.  · Surgery may be needed if you have severe pain or persistent obstruction. There are various surgical procedures. Most of the procedures are performed with the use of small instruments. Only small incisions are needed to accommodate these instruments, so recovery time is minimized.  The size, location, and chemical composition are all important variables that will determine the proper choice of action for you. Talk to your health care provider to better understand your situation so that you will minimize the risk of injury to yourself and your kidney.   HOME CARE INSTRUCTIONS   · Drink enough water and fluids to keep your urine clear or pale yellow. This will help you to pass the stone or stone fragments.  · Strain  all urine through the provided strainer. Keep all particulate matter and stones for your health care provider to see. The stone causing the pain may be as small as a grain of salt. It is very important to use the strainer each and every time you pass your urine. The collection of your stone will allow your health care provider to analyze it and verify that a stone has actually passed. The stone analysis will often identify what you can do to reduce the incidence of recurrences.  · Only take over-the-counter or prescription medicines for pain, discomfort, or fever as directed by your health care provider.  · Make a follow-up appointment with your health care provider as directed.  · Get follow-up X-rays if required. The absence of pain does not always mean that the stone has passed. It may have only stopped moving. If the urine remains completely obstructed, it can cause loss of kidney function or even complete destruction of the kidney. It is your responsibility to make sure X-rays and follow-ups are completed. Ultrasounds of the kidney can show blockages and the status of the kidney. Ultrasounds are not associated with any radiation and can be performed easily in a matter of minutes.  SEEK MEDICAL CARE IF:  · You experience pain that is progressive and unresponsive to any pain medicine you have been prescribed.  SEEK IMMEDIATE MEDICAL CARE IF:   · Pain cannot be controlled with the prescribed medicine.  · You have a fever   or shaking chills.  · The severity or intensity of pain increases over 18 hours and is not relieved by pain medicine.  · You develop a new onset of abdominal pain.  · You feel faint or pass out.  · You are unable to urinate.  MAKE SURE YOU:   · Understand these instructions.  · Will watch your condition.  · Will get help right away if you are not doing well or get worse.  Document Released: 01/04/2005 Document Revised: 09/06/2012 Document Reviewed: 06/07/2012  [employer]® Patient Information ©2014  [employer].

## 2013-04-21 LAB — STONE ANALYSIS: STONE WEIGHT KSTONE: 0.09 g

## 2013-05-15 ENCOUNTER — Emergency Department (HOSPITAL_BASED_OUTPATIENT_CLINIC_OR_DEPARTMENT_OTHER): Payer: Self-pay

## 2013-05-15 ENCOUNTER — Encounter (HOSPITAL_COMMUNITY): Admission: EM | Disposition: A | Discharge status: Home / Self Care | Payer: Self-pay | Source: Home / Self Care

## 2013-05-15 ENCOUNTER — Ambulatory Visit (HOSPITAL_BASED_OUTPATIENT_CLINIC_OR_DEPARTMENT_OTHER)
Admission: EM | Admit: 2013-05-15 | Discharge: 2013-05-15 | Disposition: A | Discharge status: Home / Self Care | Payer: Self-pay | Attending: Emergency Medicine | Admitting: Emergency Medicine

## 2013-05-15 ENCOUNTER — Ambulatory Visit: Admit: 2013-05-15 | Payer: Self-pay | Admitting: Urology

## 2013-05-15 ENCOUNTER — Emergency Department (HOSPITAL_COMMUNITY): Payer: Medicaid Other | Admitting: Anesthesiology

## 2013-05-15 ENCOUNTER — Encounter (HOSPITAL_BASED_OUTPATIENT_CLINIC_OR_DEPARTMENT_OTHER): Payer: Self-pay | Admitting: Emergency Medicine

## 2013-05-15 ENCOUNTER — Encounter (HOSPITAL_COMMUNITY): Payer: Self-pay | Admitting: Anesthesiology

## 2013-05-15 DIAGNOSIS — Z9089 Acquired absence of other organs: Secondary | ICD-10-CM | POA: Insufficient documentation

## 2013-05-15 DIAGNOSIS — K7460 Unspecified cirrhosis of liver: Secondary | ICD-10-CM | POA: Insufficient documentation

## 2013-05-15 DIAGNOSIS — E119 Type 2 diabetes mellitus without complications: Secondary | ICD-10-CM | POA: Insufficient documentation

## 2013-05-15 DIAGNOSIS — N12 Tubulo-interstitial nephritis, not specified as acute or chronic: Secondary | ICD-10-CM

## 2013-05-15 DIAGNOSIS — R739 Hyperglycemia, unspecified: Secondary | ICD-10-CM

## 2013-05-15 DIAGNOSIS — N23 Unspecified renal colic: Secondary | ICD-10-CM

## 2013-05-15 DIAGNOSIS — I10 Essential (primary) hypertension: Secondary | ICD-10-CM | POA: Insufficient documentation

## 2013-05-15 DIAGNOSIS — F17200 Nicotine dependence, unspecified, uncomplicated: Secondary | ICD-10-CM | POA: Insufficient documentation

## 2013-05-15 DIAGNOSIS — N201 Calculus of ureter: Principal | ICD-10-CM | POA: Insufficient documentation

## 2013-05-15 DIAGNOSIS — N1330 Unspecified hydronephrosis: Secondary | ICD-10-CM | POA: Insufficient documentation

## 2013-05-15 DIAGNOSIS — Z7982 Long term (current) use of aspirin: Secondary | ICD-10-CM | POA: Insufficient documentation

## 2013-05-15 HISTORY — PX: CYSTOSCOPY WITH RETROGRADE PYELOGRAM, URETEROSCOPY AND STENT PLACEMENT: SHX5789

## 2013-05-15 LAB — CBC WITH DIFFERENTIAL/PLATELET
BASOS ABS: 0.1 10*3/uL (ref 0.0–0.1)
Basophils Relative: 1 % (ref 0–1)
Eosinophils Absolute: 0.1 10*3/uL (ref 0.0–0.7)
Eosinophils Relative: 2 % (ref 0–5)
HEMATOCRIT: 51.9 % (ref 39.0–52.0)
Hemoglobin: 18 g/dL — ABNORMAL HIGH (ref 13.0–17.0)
LYMPHS ABS: 1.6 10*3/uL (ref 0.7–4.0)
Lymphocytes Relative: 25 % (ref 12–46)
MCH: 30.1 pg (ref 26.0–34.0)
MCHC: 34.7 g/dL (ref 30.0–36.0)
MCV: 86.6 fL (ref 78.0–100.0)
MONO ABS: 0.6 10*3/uL (ref 0.1–1.0)
MONOS PCT: 9 % (ref 3–12)
Neutro Abs: 4.1 10*3/uL (ref 1.7–7.7)
Neutrophils Relative %: 63 % (ref 43–77)
Platelets: 103 10*3/uL — ABNORMAL LOW (ref 150–400)
RBC: 5.99 MIL/uL — ABNORMAL HIGH (ref 4.22–5.81)
RDW: 13.5 % (ref 11.5–15.5)
WBC: 6.5 10*3/uL (ref 4.0–10.5)

## 2013-05-15 LAB — URINALYSIS, ROUTINE W REFLEX MICROSCOPIC
Bilirubin Urine: NEGATIVE
Glucose, UA: 100 mg/dL — AB
Hgb urine dipstick: NEGATIVE
KETONES UR: NEGATIVE mg/dL
LEUKOCYTES UA: NEGATIVE
NITRITE: NEGATIVE
PH: 6.5 (ref 5.0–8.0)
PROTEIN: NEGATIVE mg/dL
Specific Gravity, Urine: 1.023 (ref 1.005–1.030)
Urobilinogen, UA: 4 mg/dL — ABNORMAL HIGH (ref 0.0–1.0)

## 2013-05-15 LAB — COMPREHENSIVE METABOLIC PANEL
ALBUMIN: 4 g/dL (ref 3.5–5.2)
ALT: 106 U/L — ABNORMAL HIGH (ref 0–53)
AST: 84 U/L — ABNORMAL HIGH (ref 0–37)
Alkaline Phosphatase: 74 U/L (ref 39–117)
BUN: 20 mg/dL (ref 6–23)
CO2: 29 mEq/L (ref 19–32)
CREATININE: 0.9 mg/dL (ref 0.50–1.35)
Calcium: 10.2 mg/dL (ref 8.4–10.5)
Chloride: 102 mEq/L (ref 96–112)
GFR calc Af Amer: >90 mL/min (ref >90)
Glucose, Bld: 204 mg/dL — ABNORMAL HIGH (ref 70–99)
Potassium: 4.5 mEq/L (ref 3.7–5.3)
Sodium: 142 mEq/L (ref 137–147)
Total Bilirubin: 1.1 mg/dL (ref 0.3–1.2)
Total Protein: 7.5 g/dL (ref 6.0–8.3)

## 2013-05-15 LAB — LIPASE, BLOOD: LIPASE: 56 U/L (ref 11–59)

## 2013-05-15 LAB — GLUCOSE, CAPILLARY: Glucose-Capillary: 152 mg/dL — ABNORMAL HIGH (ref 70–99)

## 2013-05-15 SURGERY — CYSTOURETEROSCOPY, WITH RETROGRADE PYELOGRAM AND STENT INSERTION
Anesthesia: General | Site: Ureter | Laterality: Right

## 2013-05-15 MED ORDER — LABETALOL HCL 5 MG/ML IV SOLN
INTRAVENOUS | Status: DC | PRN
  Administered 2013-05-15: 5 mg via INTRAVENOUS

## 2013-05-15 MED ORDER — TAMSULOSIN HCL 0.4 MG PO CAPS: 0.4000 mg | ORAL_CAPSULE | Freq: Every day | ORAL | Status: DC

## 2013-05-15 MED ORDER — HYDROCHLOROTHIAZIDE 25 MG PO TABS
25.0000 mg | ORAL_TABLET | Freq: Once | ORAL | Status: AC
  Administered 2013-05-15: 25 mg via ORAL
  Filled 2013-05-15: qty 1

## 2013-05-15 MED ORDER — CIPROFLOXACIN IN D5W 400 MG/200ML IV SOLN
INTRAVENOUS | Status: AC
  Filled 2013-05-15: qty 200

## 2013-05-15 MED ORDER — HYDROCHLOROTHIAZIDE 25 MG PO TABS: 25.0000 mg | ORAL_TABLET | Freq: Every day | ORAL | Status: DC

## 2013-05-15 MED ORDER — CEFTRIAXONE SODIUM 1 G IJ SOLR
1.0000 g | Freq: Once | INTRAMUSCULAR | Status: AC
  Administered 2013-05-15: 1 g via INTRAVENOUS

## 2013-05-15 MED ORDER — PROMETHAZINE HCL 25 MG/ML IJ SOLN
12.5000 mg | Freq: Once | INTRAMUSCULAR | Status: AC
  Administered 2013-05-15: 12.5 mg via INTRAVENOUS
  Filled 2013-05-15: qty 1

## 2013-05-15 MED ORDER — METFORMIN HCL 500 MG PO TABS: 500.0000 mg | ORAL_TABLET | Freq: Two times a day (BID) | ORAL | Status: DC

## 2013-05-15 MED ORDER — PROPOFOL 10 MG/ML IV BOLUS
INTRAVENOUS | Status: DC | PRN
  Administered 2013-05-15: 150 mg via INTRAVENOUS

## 2013-05-15 MED ORDER — SODIUM CHLORIDE 0.9 % IR SOLN
Status: DC | PRN
Start: 2013-05-15 — End: 2013-05-15
  Administered 2013-05-15: 1000 mL

## 2013-05-15 MED ORDER — HYDROMORPHONE HCL PF 2 MG/ML IJ SOLN
2.0000 mg | Freq: Once | INTRAMUSCULAR | Status: AC
  Administered 2013-05-15: 2 mg via INTRAVENOUS
  Filled 2013-05-15: qty 1

## 2013-05-15 MED ORDER — LACTATED RINGERS IV SOLN: INTRAVENOUS | Status: DC

## 2013-05-15 MED ORDER — FENTANYL CITRATE 0.05 MG/ML IJ SOLN
INTRAMUSCULAR | Status: AC
  Filled 2013-05-15: qty 2

## 2013-05-15 MED ORDER — KETOROLAC TROMETHAMINE 30 MG/ML IJ SOLN: 30.0000 mg | Freq: Once | INTRAMUSCULAR | Status: DC

## 2013-05-15 MED ORDER — HYDROMORPHONE HCL PF 1 MG/ML IJ SOLN
1.0000 mg | Freq: Once | INTRAMUSCULAR | Status: AC
  Administered 2013-05-15: 1 mg via INTRAVENOUS
  Filled 2013-05-15: qty 1

## 2013-05-15 MED ORDER — SODIUM CHLORIDE 0.9 % IR SOLN
Status: DC | PRN
  Administered 2013-05-15: 3000 mL

## 2013-05-15 MED ORDER — LABETALOL HCL 5 MG/ML IV SOLN
INTRAVENOUS | Status: AC
  Filled 2013-05-15: qty 4

## 2013-05-15 MED ORDER — HYDROCODONE-ACETAMINOPHEN 5-325 MG PO TABS
ORAL_TABLET | ORAL | Status: DC
Start: 2013-05-15 — End: 2013-05-16
  Filled 2013-05-15: qty 2

## 2013-05-15 MED ORDER — SODIUM CHLORIDE 0.9 % IV BOLUS (SEPSIS)
1000.0000 mL | Freq: Once | INTRAVENOUS | Status: AC
  Administered 2013-05-15: 1000 mL via INTRAVENOUS

## 2013-05-15 MED ORDER — LIDOCAINE HCL (PF) 2 % IJ SOLN
INTRAMUSCULAR | Status: DC | PRN
  Administered 2013-05-15: 20 mg via INTRADERMAL

## 2013-05-15 MED ORDER — LIDOCAINE HCL 2 % EX GEL
CUTANEOUS | Status: DC | PRN
  Administered 2013-05-15: 1 via URETHRAL

## 2013-05-15 MED ORDER — HYDROMORPHONE HCL PF 1 MG/ML IJ SOLN: 1.0000 mg | Freq: Once | INTRAMUSCULAR | Status: DC

## 2013-05-15 MED ORDER — METOPROLOL TARTRATE 1 MG/ML IV SOLN
5.0000 mg | Freq: Once | INTRAVENOUS | Status: AC
  Administered 2013-05-15: 5 mg via INTRAVENOUS
  Filled 2013-05-15: qty 5

## 2013-05-15 MED ORDER — FENTANYL CITRATE 0.05 MG/ML IJ SOLN: 25.0000 ug | INTRAMUSCULAR | Status: DC | PRN

## 2013-05-15 MED ORDER — PROMETHAZINE HCL 25 MG/ML IJ SOLN: 6.2500 mg | INTRAMUSCULAR | Status: DC | PRN

## 2013-05-15 MED ORDER — KETOROLAC TROMETHAMINE 30 MG/ML IJ SOLN: 15.0000 mg | Freq: Once | INTRAMUSCULAR | Status: DC | PRN

## 2013-05-15 MED ORDER — METFORMIN HCL 500 MG PO TABS
500.0000 mg | ORAL_TABLET | Freq: Once | ORAL | Status: AC
  Administered 2013-05-15: 500 mg via ORAL
  Filled 2013-05-15: qty 1

## 2013-05-15 MED ORDER — MIDAZOLAM HCL 2 MG/2ML IJ SOLN
INTRAMUSCULAR | Status: AC
Start: 2013-05-15 — End: 2013-05-15
  Filled 2013-05-15: qty 2

## 2013-05-15 MED ORDER — FENTANYL CITRATE 0.05 MG/ML IJ SOLN
INTRAMUSCULAR | Status: DC | PRN
Start: 2013-05-15 — End: 2013-05-15
  Administered 2013-05-15: 100 ug via INTRAVENOUS
  Administered 2013-05-15 (×2): 50 ug via INTRAVENOUS

## 2013-05-15 MED ORDER — CIPROFLOXACIN IN D5W 400 MG/200ML IV SOLN
INTRAVENOUS | Status: DC | PRN
  Administered 2013-05-15: 400 mg via INTRAVENOUS

## 2013-05-15 MED ORDER — LACTATED RINGERS IV SOLN
INTRAVENOUS | Status: DC | PRN
  Administered 2013-05-15: 19:00:00 via INTRAVENOUS

## 2013-05-15 MED ORDER — CEFTRIAXONE SODIUM 1 G IJ SOLR
INTRAMUSCULAR | Status: AC
  Filled 2013-05-15: qty 10

## 2013-05-15 MED ORDER — BELLADONNA ALKALOIDS-OPIUM 16.2-60 MG RE SUPP
RECTAL | Status: DC | PRN
  Administered 2013-05-15: 1 via RECTAL

## 2013-05-15 MED ORDER — SUCCINYLCHOLINE CHLORIDE 20 MG/ML IJ SOLN
INTRAMUSCULAR | Status: DC | PRN
  Administered 2013-05-15: 200 mg via INTRAVENOUS

## 2013-05-15 MED ORDER — BELLADONNA ALKALOIDS-OPIUM 16.2-60 MG RE SUPP
RECTAL | Status: AC
  Filled 2013-05-15: qty 1

## 2013-05-15 MED ORDER — IOHEXOL 300 MG/ML  SOLN
INTRAMUSCULAR | Status: DC | PRN
  Administered 2013-05-15: 10 mL

## 2013-05-15 MED ORDER — HYDROCODONE-ACETAMINOPHEN 5-325 MG PO TABS: 1.0000 | ORAL_TABLET | Freq: Four times a day (QID) | ORAL | Status: DC | PRN

## 2013-05-15 MED ORDER — LIDOCAINE HCL 2 % EX GEL
CUTANEOUS | Status: AC
  Filled 2013-05-15: qty 10

## 2013-05-15 MED ORDER — HYDROCODONE-ACETAMINOPHEN 5-325 MG PO TABS
2.0000 | ORAL_TABLET | Freq: Once | ORAL | Status: AC
  Administered 2013-05-15: 2 via ORAL

## 2013-05-15 MED ORDER — PROPOFOL 10 MG/ML IV BOLUS
INTRAVENOUS | Status: AC
  Filled 2013-05-15: qty 20

## 2013-05-15 SURGICAL SUPPLY — 18 items
BAG URO CATCHER STRL LF (DRAPE) ×4 IMPLANT
CATH CLEAR GEL 3F BACKSTOP (CATHETERS) ×4 IMPLANT
CATH INTERMIT  6FR 70CM (CATHETERS) ×4 IMPLANT
CATH URET 5FR 28IN CONE TIP (BALLOONS) ×2
CATH URET 5FR 70CM CONE TIP (BALLOONS) ×2 IMPLANT
CLOTH BEACON ORANGE TIMEOUT ST (SAFETY) ×4 IMPLANT
DRAPE CAMERA CLOSED 9X96 (DRAPES) ×4 IMPLANT
GLOVE BIOGEL M STRL SZ7.5 (GLOVE) ×4 IMPLANT
GOWN STRL REUS W/TWL XL LVL3 (GOWN DISPOSABLE) ×4 IMPLANT
GUIDEWIRE STR DUAL SENSOR (WIRE) ×8 IMPLANT
IV NS IRRIG 3000ML ARTHROMATIC (IV SOLUTION) ×8 IMPLANT
MANIFOLD NEPTUNE II (INSTRUMENTS) ×4 IMPLANT
NS IRRIG 1000ML POUR BTL (IV SOLUTION) ×4 IMPLANT
PACK CYSTO (CUSTOM PROCEDURE TRAY) ×4 IMPLANT
STENT CONTOUR 7FRX26X.038 (STENTS) ×4 IMPLANT
TUBING CONNECTING 10 (TUBING) ×3 IMPLANT
TUBING CONNECTING 10' (TUBING) ×1
WIRE COONS/BENSON .038X145CM (WIRE) IMPLANT

## 2013-05-15 NOTE — Op Note (Signed)
Preoperative diagnosis: right ureteral calculus  Postoperative diagnosis: right ureteral calculus  Procedure:  1. Cystoscopy 2. right ureteroscopy  3. right 66F x 26cm ureteral stent placement  4. right retrograde pyelography with interpretation  Surgeon: Crist FatBenjamin W. Herrick, MD  Anesthesia: General  Complications: None  Intraoperative findings: right retrograde pyelography demonstrated a filling defect within the right ureter consistent with the patient's known calculus without other abnormalities, Severe right to ureteral nephrosis  EBL: Minimal  Specimens: 1. Right renal pelvis urine culture  Disposition of specimens: [facility] for stone analysis  Indication: Alisa Graffrnest P Panchal is a 53 y.o.   patient with urolithiasis. After reviewing the management options for treatment, the patient elected to proceed with the above surgical procedure(s). We have discussed the potential benefits and risks of the procedure, side effects of the proposed treatment, the likelihood of the patient achieving the goals of the procedure, and any potential problems that might occur during the procedure or recuperation. Informed consent has been obtained.  Description of procedure:  The patient was taken to the operating room and general anesthesia was induced.  The patient was placed in the dorsal lithotomy position, prepped and draped in the usual sterile fashion, and preoperative antibiotics were administered. A preoperative time-out was performed.   Cystourethroscopy was performed.  The patient's urethra was examined and was normal.  The bladder was then systematically examined in its entirety. There was no evidence for any bladder tumors, stones, or other mucosal pathology.    Attention then turned to the right ureteral orifice and a ureteral catheter was used to intubate the ureteral orifice.  Omnipaque contrast was injected through the ureteral catheter and a retrograde pyelogram  was performed with findings as dictated above.  A 0.38 sensor guidewire was then advanced up the right ureter into the renal pelvis under fluoroscopic guidance. The 6 Fr semirigid ureteroscope was then advanced into the ureter next to the guidewire and I was unable to get over the pelvic brim and access the stone. I then removed the semirigid scope over the wire and reinserted the open-ended ureteral catheter into the renal pelvis and aspirated proximally 120 cc of urine prior to performing a second retrograde pyelogram.  The wire was then backloaded through the cystoscope and a ureteral stent was advance over the wire using Seldinger technique.  The stent was positioned appropriately under fluoroscopic and cystoscopic guidance.  The wire was then removed with an adequate stent curl noted in the renal pelvis as well as in the bladder.  The bladder was then emptied and the procedure ended.  The patient appeared to tolerate the procedure well and without complications.  The patient was able to be awakened and transferred to the recovery unit in satisfactory condition.   Disposition:  The patient will be scheduled for repeat ureteroscopy, stone removal, stent exchange in approximately 2 weeks.

## 2013-05-15 NOTE — Anesthesia Preprocedure Evaluation (Addendum)
Anesthesia Evaluation  Patient identified by MRN, date of birth, ID band Patient awake    Reviewed: Allergy & Precautions, H&P , NPO status , Patient's Chart, lab work & pertinent test results  Airway Mallampati: IV TM Distance: <3 FB Neck ROM: Full    Dental no notable dental hx.    Pulmonary Current Smoker,  breath sounds clear to auscultation  Pulmonary exam normal       Cardiovascular hypertension, Rhythm:Regular Rate:Normal  Uncontrolled HTN   Neuro/Psych negative neurological ROS  negative psych ROS   GI/Hepatic negative GI ROS, Neg liver ROS,   Endo/Other  diabetesMorbid obesity  Renal/GU negative Renal ROS  negative genitourinary   Musculoskeletal negative musculoskeletal ROS (+)   Abdominal   Peds negative pediatric ROS (+)  Hematology negative hematology ROS (+)   Anesthesia Other Findings   Reproductive/Obstetrics negative OB ROS                          Anesthesia Physical Anesthesia Plan  ASA: III  Anesthesia Plan: General   Post-op Pain Management:    Induction: Intravenous  Airway Management Planned: Oral ETT and Video Laryngoscope Planned  Additional Equipment:   Intra-op Plan:   Post-operative Plan: Extubation in OR  Informed Consent: I have reviewed the patients History and Physical, chart, labs and discussed the procedure including the risks, benefits and alternatives for the proposed anesthesia with the patient or authorized representative who has indicated his/her understanding and acceptance.   Dental advisory given  Plan Discussed with: CRNA and Surgeon  Anesthesia Plan Comments:         Anesthesia Quick Evaluation

## 2013-05-15 NOTE — Discharge Instructions (Signed)
DISCHARGE INSTRUCTIONS FOR KIDNEY STONES OR URETERAL STENT   MEDICATIONS:  1. DO NOT RESUME YOUR ASPIRIN, or any other medicines like ibuprofen, motrin, excedrin, advil, aleve, vitamin E, fish oil as these can all cause bleeding x 7 days.  2. Resume all your other meds from home - except do not take any other pain meds that you may have at home.  3. Take Cipro one hour prior to removal of your stent.  4. Trospium is to prevent bladder spasms and help reduce urinary frequency. 5. Pyridium is to help with the burning/stinging when you urinate. 6. Tylenol with codeine is for moderate/severe pain, otherwise taking upto 1000mg  every 6 hours of plainTylenol will help treat your pain.  Do not take both at the same time.  ACTIVITY:  1. No strenuous activity x 1week  2. No driving while on narcotic pain medications  3. Drink plenty of water  4. Continue to walk at home - you can still get blood clots when you are at home, so keep active, but don't over do it.  5. May return to work/school tomorrow or when you feel ready   BATHING:  1. You can shower and we recommend daily showers  2. You have a string coming from your urethra: The stent string is attached to your ureteral stent. Do not pull on this.   SIGNS/SYMPTOMS TO CALL:  Please call us if you have a fever greater than 101.5, uncontrolled nausea/vomiting, uncontrolled pain, dizziness, unable to urinate, bloody urine, chest pain, shortness of breath, leg swelling, leg pain, redness around wound, drainage from wound, or any other concerns or questions.   You can reach [place] at [phone].   FOLLOW-UP:  1. You will be contacted with a surgery date to remove your stones.   Type 2 Diabetes Mellitus, Adult Type 2 diabetes mellitus, often simply referred to as type 2 diabetes, is a long-lasting (chronic) disease. In type 2 diabetes, the pancreas does not make enough insulin (a hormone), the cells are less responsive to the insulin that is made  (insulin resistance), or both. Normally, insulin moves sugars from food into the tissue cells. The tissue cells use the sugars for energy. The lack of insulin or the lack of normal response to insulin causes excess sugars to build up in the blood instead of going into the tissue cells. As a result, high blood sugar (hyperglycemia) develops. The effect of high sugar (glucose) levels can cause many complications. Type 2 diabetes was also previously called adult-onset diabetes but it can occur at any age.  RISK FACTORS  A person is predisposed to developing type 2 diabetes if someone in the family has the disease and also has one or more of the following primary risk factors:  Overweight.  An inactive lifestyle.  A history of consistently eating high-calorie foods. Maintaining a normal weight and regular physical activity can reduce the chance of developing type 2 diabetes. SYMPTOMS  A person with type 2 diabetes may not show symptoms initially. The symptoms of type 2 diabetes appear slowly. The symptoms include:  Increased thirst (polydipsia).  Increased urination (polyuria).  Increased urination during the night (nocturia).  Weight loss. This weight loss may be rapid.  Frequent, recurring infections.  Tiredness (fatigue).  Weakness.  Vision changes, such as blurred vision.  Fruity smell to your breath.  Abdominal pain.  Nausea or vomiting.  Cuts or bruises which are slow to heal.  Tingling or numbness in the hands or feet. DIAGNOSIS Type  2 diabetes is frequently not diagnosed until complications of diabetes are present. Type 2 diabetes is diagnosed when symptoms or complications are present and when blood glucose levels are increased. Your blood glucose level may be checked by one or more of the following blood tests:  A fasting blood glucose test. You will not be allowed to eat for at least 8 hours before a blood sample is taken.  A random blood glucose test. Your blood  glucose is checked at any time of the day regardless of when you ate.  A hemoglobin A1c blood glucose test. A hemoglobin A1c test provides information about blood glucose control over the previous 3 months.  An oral glucose tolerance test (OGTT). Your blood glucose is measured after you have not eaten (fasted) for 2 hours and then after you drink a glucose-containing beverage. TREATMENT   You may need to take insulin or diabetes medicine daily to keep blood glucose levels in the desired range.  You will need to match insulin dosing with exercise and healthy food choices. The treatment goal is to maintain the before meal blood sugar (preprandial glucose) level at 70 130 mg/dL. HOME CARE INSTRUCTIONS   Have your hemoglobin A1c level checked twice a year.  Perform daily blood glucose monitoring as directed by your caregiver.  Monitor urine ketones when you are ill and as directed by your caregiver.  Take your diabetes medicine or insulin as directed by your caregiver to maintain your blood glucose levels in the desired range.  Never run out of diabetes medicine or insulin. It is needed every day.  Adjust insulin based on your intake of carbohydrates. Carbohydrates can raise blood glucose levels but need to be included in your diet. Carbohydrates provide vitamins, minerals, and fiber which are an essential part of a healthy diet. Carbohydrates are found in fruits, vegetables, whole grains, dairy products, legumes, and foods containing added sugars.    Eat healthy foods. Alternate 3 meals with 3 snacks.  Lose weight if overweight.  Carry a medical alert card or wear your medical alert jewelry.  Carry a 15 gram carbohydrate snack with you at all times to treat low blood glucose (hypoglycemia). Some examples of 15 gram carbohydrate snacks include:  Glucose tablets, 3 or 4   Glucose gel, 15 gram tube  Raisins, 2 tablespoons (24 grams)  Jelly beans, 6  Animal crackers, 8  Regular  pop, 4 ounces (120 mL)  Gummy treats, 9  Recognize hypoglycemia. Hypoglycemia occurs with blood glucose levels of 70 mg/dL and below. The risk for hypoglycemia increases when fasting or skipping meals, during or after intense exercise, and during sleep. Hypoglycemia symptoms can include:  Tremors or shakes.  Decreased ability to concentrate.  Sweating.  Increased heart rate.  Headache.  Dry mouth.  Hunger.  Irritability.  Anxiety.  Restless sleep.  Altered speech or coordination.  Confusion.  Treat hypoglycemia promptly. If you are alert and able to safely swallow, follow the 15:15 rule:  Take 15 20 grams of rapid-acting glucose or carbohydrate. Rapid-acting options include glucose gel, glucose tablets, or 4 ounces (120 mL) of fruit juice, regular soda, or low fat milk.  Check your blood glucose level 15 minutes after taking the glucose.  Take 15 20 grams more of glucose if the repeat blood glucose level is still 70 mg/dL or below.  Eat a meal or snack within 1 hour once blood glucose levels return to normal.    Be alert to polyuria and polydipsia which are  early signs of hyperglycemia. An early awareness of hyperglycemia allows for prompt treatment. Treat hyperglycemia as directed by your caregiver.  Engage in at least 150 minutes of moderate-intensity physical activity a week, spread over at least 3 days of the week or as directed by your caregiver. In addition, you should engage in resistance exercise at least 2 times a week or as directed by your caregiver.  Adjust your medicine and food intake as needed if you start a new exercise or sport.  Follow your sick day plan at any time you are unable to eat or drink as usual.  Avoid tobacco use.  Limit alcohol intake to no more than 1 drink per day for nonpregnant women and 2 drinks per day for men. You should drink alcohol only when you are also eating food. Talk with your caregiver whether alcohol is safe for  you. Tell your caregiver if you drink alcohol several times a week.  Follow up with your caregiver regularly.  Schedule an eye exam soon after the diagnosis of type 2 diabetes and then annually.  Perform daily skin and foot care. Examine your skin and feet daily for cuts, bruises, redness, nail problems, bleeding, blisters, or sores. A foot exam by a caregiver should be done annually.  Brush your teeth and gums at least twice a day and floss at least once a day. Follow up with your dentist regularly.  Share your diabetes management plan with your workplace or school.  Stay up-to-date with immunizations.  Learn to manage stress.  Obtain ongoing diabetes education and support as needed.  Participate in, or seek rehabilitation as needed to maintain or improve independence and quality of life. Request a physical or occupational therapy referral if you are having foot or hand numbness or difficulties with grooming, dressing, eating, or physical activity. SEEK MEDICAL CARE IF:   You are unable to eat food or drink fluids for more than 6 hours.  You have nausea and vomiting for more than 6 hours.  Your blood glucose level is over 240 mg/dL.  There is a change in mental status.  You develop an additional serious illness.  You have diarrhea for more than 6 hours.  You have been sick or have had a fever for a couple of days and are not getting better.  You have pain during any physical activity.  SEEK IMMEDIATE MEDICAL CARE IF:  You have difficulty breathing.  You have moderate to large ketone levels. MAKE SURE YOU:  Understand these instructions.  Will watch your condition.  Will get help right away if you are not doing well or get worse. Document Released: 01/04/2005 Document Revised: 09/29/2011 Document Reviewed: 08/03/2011 [facility] Patient Information 2014 [place], [place]. Hypertension As your heart beats, it forces blood through your arteries. This force is your blood  pressure. If the pressure is too high, it is called hypertension (HTN) or high blood pressure. HTN is dangerous because you may have it and not know it. High blood pressure may mean that your heart has to work harder to pump blood. Your arteries may be narrow or stiff. The extra work puts you at risk for heart disease, stroke, and other problems.  Blood pressure consists of two numbers, a higher number over a lower, 110/72, for example. It is stated as "110 over 72." The ideal is below 120 for the top number (systolic) and under 80 for the bottom (diastolic). Write down your blood pressure today. You should pay close attention to your  blood pressure if you have certain conditions such as:  Heart failure.  Prior heart attack.  Diabetes  Chronic kidney disease.  Prior stroke.  Multiple risk factors for heart disease. To see if you have HTN, your blood pressure should be measured while you are seated with your arm held at the level of the heart. It should be measured at least twice. A one-time elevated blood pressure reading (especially in the Emergency Department) does not mean that you need treatment. There may be conditions in which the blood pressure is different between your right and left arms. It is important to see your caregiver soon for a recheck. Most people have essential hypertension which means that there is not a specific cause. This type of high blood pressure may be lowered by changing lifestyle factors such as:  Stress.  Smoking.  Lack of exercise.  Excessive weight.  Drug/tobacco/alcohol use.  Eating less salt. Most people do not have symptoms from high blood pressure until it has caused damage to the body. Effective treatment can often prevent, delay or reduce that damage. TREATMENT  When a cause has been identified, treatment for high blood pressure is directed at the cause. There are a large number of medications to treat HTN. These fall into several categories, and  your caregiver will help you select the medicines that are best for you. Medications may have side effects. You should review side effects with your caregiver. If your blood pressure stays high after you have made lifestyle changes or started on medicines,   Your medication(s) may need to be changed.  Other problems may need to be addressed.  Be certain you understand your prescriptions, and know how and when to take your medicine.  Be sure to follow up with your caregiver within the time frame advised (usually within two weeks) to have your blood pressure rechecked and to review your medications.  If you are taking more than one medicine to lower your blood pressure, make sure you know how and at what times they should be taken. Taking two medicines at the same time can result in blood pressure that is too low. SEEK IMMEDIATE MEDICAL CARE IF:  You develop a severe headache, blurred or changing vision, or confusion.  You have unusual weakness or numbness, or a faint feeling.  You have severe chest or abdominal pain, vomiting, or breathing problems. MAKE SURE YOU:   Understand these instructions.  Will watch your condition.  Will get help right away if you are not doing well or get worse. Document Released: 01/04/2005 Document Revised: 03/29/2011 Document Reviewed: 08/25/2007 [facility] Patient Information 2014 [place].    Emergency Department Resource Guide 1) Find a Doctor and Pay Out of Pocket Although you won't have to find out who is covered by your insurance plan, it is a good idea to ask around and get recommendations. You will then need to call the office and see if the doctor you have chosen will accept you as a new patient and what types of options they offer for patients who are self-pay. Some doctors offer discounts or will set up payment plans for their patients who do not have insurance, but you will need to ask so you aren't surprised when you get to your  appointment.  2) Contact Your Local Health Department Not all health departments have doctors that can see patients for sick visits, but many do, so it is worth a call to see if yours does. If you don't know  where your local health department is, you can check in your phone book. The CDC also has a tool to help you locate your state's health department, and many state websites also have listings of all of their local health departments.  3) Find a Walk-in Clinic If your illness is not likely to be very severe or complicated, you may want to try a walk in clinic. These are popping up all over the country in pharmacies, drugstores, and shopping centers. They're usually staffed by nurse practitioners or physician assistants that have been trained to treat common illnesses and complaints. They're usually fairly quick and inexpensive. However, if you have serious medical issues or chronic medical problems, these are probably not your best option.  No Primary Care Doctor: - Call Health Connect at  [phone] - they can help you locate a primary care doctor that  accepts your insurance, provides certain services, etc. - Physician Referral Service- [phone]  Chronic Pain Problems: Organization         Address  Phone   Notes  [facility]  [phone] Patients need to be referred by their primary care doctor.   Medication Assistance: Organization         Address  Phone   Notes  [facility] Medication [facility] [address] [place], [place] [address] [phone] --Must be a resident of [facility] -- Must have NO insurance coverage whatsoever (no Medicaid/ Medicare, etc.) -- The pt. MUST have a primary care doctor that directs their care regularly and follows them in the community   [facility]  [phone]   [employer]  [phone]    Agencies that provide inexpensive medical care: Organization         Address  Phone   Notes  [facility]  [phone]   [facility]    [phone]   [facility] [address] [place], [place] [address] [phone]   [facility] [address] [place] [place] [phone]   Planned Parenthood    [phone]   [facility]    [phone]   [facility] and [facility]  [address], [place] Phone:  [phone], Fax:  [phone] Hours of Operation:  9 am - 6 pm, M-F.  Also accepts Medicaid/Medicare and self-pay.  [facility]  [address], [place] Phone: [phone], Fax: [phone]. Hours of Operation:  8:30 am - 5:30 pm, M-F.  Also accepts Medicaid and self-pay.  [facility] [place] [address], [place] Phone: [phone]   [facility] [address] Natasha Bence [place], [place] [phone], Ext. [phone] Mondays & Thursdays: 7-9 AM.  First 15 patients are seen on a first come, first serve basis.    Medicaid-accepting [facility] Providers:  Organization         Address  Phone   Notes  [facility] [address],  [phone] Also accepts self-pay patients.  [facility] [address] Laurell Josephs [place], [place]  [phone]   [facility] [address], [place] [phone]   [facility] [address] [place] [phone]   Renaye Rakers [address], [place]   [phone] Only accepts [place] patients  after they have their name applied to their card.   Self-Pay (no insurance) in [facility]:  Organization         Address  Phone   Notes  Sickle Cell Patients, [facility] [address], [place] [phone]   [facility] [address], [place] [phone]) [phone]   [facility]  [place]  [address], [place] [phone]   [facility]/Dr. Osei-Bonsu  [address] [place] [address], [place] or [phone], [place] [phone] Phone number for both [place] and [place] locations is the same.  [facility] [address], [place] [phone]) [phone]   [facility] [address] [place] [address], [place] or [address] [phone]) [phone]   [facility] [address], [place] [phone]) [phone], phone; [phone], fax Sees patients 1st and 3rd Saturday of every month.  Must not qualify for public or private insurance (i.e. Medicaid, Medicare, [place] Health Choice, Veterans' Benefits)  Household income should be no more than 200% of the poverty level The clinic cannot treat you if you are pregnant or think you are pregnant  Sexually transmitted diseases are not treated at the clinic.    Dental Care: Organization         Address  Phone  Notes  [facility] [address], [place] [phone] Accepts children up to age 52 who are enrolled in [place] or [place] Health Choice; pregnant women with a Medicaid card; and children who have applied for Medicaid or [place] Health Choice, but were declined, whose parents can pay a reduced fee at time of service.  [facility] [place]  [address], [place] [phone]) [phone] Accepts children up to age 52 who are enrolled in [place] or [place] Health Choice; pregnant women with a Medicaid card; and children who have applied for Medicaid or [place] Health Choice, but were declined, whose parents can pay a reduced fee at time of service.  [facility]  [address], [place] [phone] Patients are seen by appointment only. Walk-ins are not accepted. [facility] will see patients 52 years of age and  older. Monday - Tuesday (8am-5pm) Most Wednesdays (8:30-5pm) $30 per visit, cash only  [facility]  [address], [facility] [place] [phone] Patients are seen by appointment only. Walk-ins are not accepted. [facility] will see patients 52 years of age and older. One Wednesday Evening (Monthly: Volunteer Based).  $30 per visit, cash only  [employer] [facility]  [phone] for adults; Children under age 52, call Graduate Pediatric Dentistry at [phone]. Children aged 334-14, please call [phone] to request a pediatric application.  Dental services are provided in all areas of dental care including fillings, crowns and bridges, complete and partial dentures, implants, gum treatment, root canals, and extractions. Preventive care is also provided. Treatment is provided to both adults and children. Patients are selected via a lottery and there is often a waiting list.   [facility] [address], [place]  [phone] [email or web address]   [facility] [address], [place], [place] [phone], Ext. [phone] Second and Fourth Thursday of each month, opens at 6:30 AM; Clinic ends at 9 AM.  Patients  are seen on a first-come first-served basis, and a limited number are seen during each clinic.   [facility]  [address] Ether Griffins [place], [place] [phone]   Eligibility Requirements You must have lived in [place], [place], or [place] counties for at least the last three months.   You cannot be eligible for state or federal sponsored [place], including [employer], [place], or [employer].   You generally cannot be eligible for healthcare insurance through your employer.    How to apply: Eligibility screenings are held every Tuesday and Wednesday afternoon from 1:00 pm until 4:00 pm. You do not need an appointment for the interview!  [facility] [address]  [place], [place] [phone]   [facility] Health Department  [phone]   [facility] Health Department  [phone]   [facility] Health Department  [phone]    Behavioral Health Resources in the Community: Intensive Outpatient Programs Organization         Address  Phone  Notes  [facility] Services [address] [place], [place] [phone]   [facility] [address] [place], [place] [phone]   [facility] [address], [place], [place]  [phone]   [facility] Mental Health [address]  [place], [place] [phone] or [phone]   Substance Abuse Resources Organization         Address  Phone  Notes  [facility] Drug Services  [phone]   [facility]  [phone]   [facility] [place]  [phone]   Floydene Flock  [phone]   Residential & Outpatient Substance Abuse Program  [phone]   Psychological Services Organization         Address  Phone  Notes  [facility]  [phone]   [facility] Services  [phone]   [facility] Mental Health [address] [place] [phone] or [phone]    Mobile Crisis [facility] Organization         Address  Phone  Notes  [facility], Mobile Crisis Care Unit  [phone]   [facility]  [address] [place], [place] [phone]   Doristine Locks [address] [place] [phone]    Self-Help/Support Groups Organization         Address  Phone             Notes  [facility] - variety of support groups  336- [phone] Call for more information  Narcotics Anonymous (NA), [facility] [address], [employer] [place]  2 meetings at this location   Statistician         Address  Phone  Notes  [facility] [address],    [place]  [phone]   [facility]  [address] [place] [address], [place], [place] [phone]   [facility] [address] [place], [place] [phone] Admissions: 8am-3pm M-F  [facility] [address],    [place], [place] [phone]   [facility] [address] [place] [place], [place] [phone]   [facility] [address]  [place], [place] [phone]   [facility] - Intensive Outpatient [address], [place], [place] [phone]   [facility] [address]  [place], [place] [phone] or [phone]   [facility] [address] [place], [place] [phone] Accepts Medicaid  °[facility] [address]  °[place] [phone] Substance Abuse/Addiction Treatment  ° °[place] [facility] °Organization         Address  Phone  Notes  °[facility]  [phone]   °Julie Brannon, PhD [address] [place], [place]   [phone]) [phone] or [phone]   °[facility]   [address] °[place], [place] [phone]   °[facility] [address], [place], [place] [phone] Insurance/Medicaid/sponsorship through [facility]  °[facility] [address]                                    [place], [place] [phone] Therapy/tele-psych/case  °[facility] [address]  ° [place], [place] [phone]    °Dr. Arfeen  [phone]   °[facility] [place]  [employer] [facility] 1) [address], [place] °2) [address], [place] °3)  [address], [place] [phone] °([phone]) [phone] ° °([phone]) [phone]   °[place] [facility] [phone]) [phone] or (336) [phone] (After Hours)    ° ° ° °

## 2013-05-15 NOTE — Transfer of Care (Signed)
Immediate Anesthesia Transfer of Care Note  Patient: Michael Carroll P Hofstra  Procedure(s) Performed: Procedure(s) (LRB): CYSTOSCOPY WITH RETROGRADE PYELOGRAM, URETEROSCOPY AND STENT PLACEMENT (Right)  Patient Location: PACU  Anesthesia Type: General  Level of Consciousness: sedated, patient cooperative and responds to stimulation  Airway & Oxygen Therapy: Patient Spontanous Breathing and Patient connected to face mask oxgen  Post-op Assessment: Report given to PACU RN and Post -op Vital signs reviewed and stable  Post vital signs: Reviewed and stable  Complications: No apparent anesthesia complications

## 2013-05-15 NOTE — ED Notes (Signed)
Pt given urinal with instructions for cc urine sample. Pt states he is unable to void at this time.

## 2013-05-15 NOTE — ED Notes (Signed)
Per EMS, [facility], here with renal calculi, needs to see urologist.

## 2013-05-15 NOTE — ED Provider Notes (Signed)
CSN: [id number]     Arrival date & time 05/15/13  5621 History   First MD Initiated Contact with Patient 05/15/13 1028     Chief Complaint  Patient presents with  . Abdominal Pain     (Consider location/radiation/quality/duration/timing/severity/associated sxs/prior Treatment) HPI 53 year old male who comes in today complaining of right sided abdominal pain from his back to his flank. He states that it is similar to previous kidney stone pain. He has had his gallbladder out. He describes the pain as occurring for 4 days. He cannot describe factors that cause it to worsen or to decrease. He has not taken any medication for this. He has not noted any change in his urine. He does have a history of cirrhosis that was not thought to be secondary to alcohol. He states that this was diagnosed when he had his gallbladder out. He has been told that he needs to followup but does not currently have a primary care doctor. He was seen here last month with a right ureteral stone. He feels that that got better. He has not followed with his urologist for the past year. He also complains of nausea but has not vomited. He denies fever, chills, diarrhea. Past Medical History  Diagnosis Date  . Hypertension   . Diabetes mellitus   . Headache(784.0)     migraine history  . Cirrhosis of liver without mention of alcohol   . Kidney stones    Past Surgical History  Procedure Laterality Date  . Stents      kidney stones  . Cholecystectomy N/A 03/26/2012    Procedure: LAPAROSCOPIC CHOLECYSTECTOMY;  Surgeon: Romie Levee, MD;  Location: [facility];  Service: General;  Laterality: N/A;  . Liver biopsy N/A 03/26/2012    Procedure: LIVER BIOPSY;  Surgeon: Romie Levee, MD;  Location: [facility];  Service: General;  Laterality: N/A;   Family History  Problem Relation Age of Onset  . Cancer - Lung Mother   . Cancer Mother   . Seizures Sister   . Heart disease Father    History  Substance Use Topics  . Smoking status:  Current Every Day Smoker -- 1.50 packs/day    Types: Cigarettes  . Smokeless tobacco: Not on file  . Alcohol Use: No    Review of Systems    Allergies  Zofran and Compazine  Home Medications   Prior to Admission medications   Medication Sig Start Date End Date Taking? Authorizing Provider  aspirin 81 MG tablet Take 81 mg by mouth every evening.    Yes Historical Provider, MD  tetrahydrozoline-zinc (VISINE-AC) 0.05-0.25 % ophthalmic solution Place 1 drop into both eyes daily as needed (for dry eyes).   Yes Historical Provider, MD   BP 194/102  Pulse 68  Temp(Src) 98.4 F (36.9 C) (Oral)  Resp 18  Ht 5\' 11"  (1.803 m)  Wt 263 lb (119.296 kg)  BMI 36.70 kg/m2  SpO2 96% Physical Exam  Nursing note and vitals reviewed. Constitutional: He is oriented to person, place, and time. He appears well-developed and well-nourished.  HENT:  Head: Normocephalic and atraumatic.  Right Ear: External ear normal.  Left Ear: External ear normal.  Nose: Nose normal.  Mouth/Throat: Oropharynx is clear and moist.  Eyes: Conjunctivae and EOM are normal. Pupils are equal, round, and reactive to light. No scleral icterus.  Neck: Normal range of motion. Neck supple.  Cardiovascular: Normal rate, regular rhythm, normal heart sounds and intact distal pulses.   Pulmonary/Chest: Effort normal and breath  sounds normal.  Abdominal: Soft. Bowel sounds are normal. He exhibits no distension.  Mild ruq ttp No rlq ttp No cva ttp   Musculoskeletal: Normal range of motion.  Neurological: He is alert and oriented to person, place, and time. He has normal reflexes. He displays normal reflexes. No cranial nerve deficit. Coordination normal.  Skin: Skin is warm and dry.  Psychiatric: He has a normal mood and affect. His behavior is normal. Judgment and thought content normal.    ED Course  Procedures (including critical care time) Labs Review Labs Reviewed  CBC WITH DIFFERENTIAL - Abnormal; Notable for the  following:    RBC 5.99 (*)    Hemoglobin 18.0 (*)    Platelets 103 (*)    All other components within normal limits  COMPREHENSIVE METABOLIC PANEL - Abnormal; Notable for the following:    Glucose, Bld 204 (*)    AST 84 (*)    ALT 106 (*)    All other components within normal limits  URINALYSIS, ROUTINE W REFLEX MICROSCOPIC - Abnormal; Notable for the following:    Color, Urine AMBER (*)    Glucose, UA 100 (*)    Urobilinogen, UA 4.0 (*)    All other components within normal limits  LIPASE, BLOOD    Imaging Review Ct Abdomen Pelvis Wo Contrast  05/15/2013   CLINICAL DATA:  ABDOMINAL PAIN  EXAM: CT ABDOMEN AND PELVIS WITHOUT CONTRAST  TECHNIQUE: Multidetector CT imaging of the abdomen and pelvis was performed following the standard protocol without intravenous contrast.  COMPARISON:  DG ABDOMEN 1V dated 05/15/2013; CT ABD/PELV WO CM dated 04/17/2013  FINDINGS: Lung bases are unremarkable.  The liver demonstrates a micronodular border, and caudate enlargement. Pain status post cholecystectomy. A stable 1.8 cm portacaval lymph node is appreciated. Stable splenomegaly.  Severe hydronephrosis on the right secondary to distal 5 and 6 mm ureteral calculi. There is moderate hydroureter, with decompression distal to the calculus. Inflammatory change in the perinephric fat is appreciated. Right kidney is edematous.  The left kidney, adrenals, and pancreas are unremarkable.  The bowel is negative.  The appendix is appreciated and negative.  There is no evidence of abdominal aortic aneurysm.  Within the limitations of a noncontrast CT there is no evidence of abdominal masses nor loculated fluid collections.  A very small PICC fat containing periumbilical hernia is appreciated. There is no evidence of an inguinal hernia.  There no aggressive osseous lesions.  IMPRESSION: 1. Increased severity of the right hydronephrosis, now severe. Interval migration of the 5 mm right medullary calculus into the distal right  ureter. Persistent 6 mm calculus distal right ureter. Extravasation versus inflammatory changes reflecting pyelonephritis right kidney. 2. Cirrhotic changes within the liver as well as findings consistent with a component of portal venous hypertension 3. Stable portacaval lymph node likely reactive.   Electronically Signed   By: Salome HolmesHector  Cooper M.D.   On: 05/15/2013 13:15   Dg Abd 1 View  05/15/2013   CLINICAL DATA:  Right flank pain  EXAM: ABDOMEN - 1 VIEW  COMPARISON:  CT 04/17/2013  FINDINGS: Potential 4 mm calculus in the right ureter projecting over the right aspect the sacrum which appears similar to comparison CT exam. This potential calculus does not appear changed in position.  IMPRESSION: Potential right ureteral calculus similar to comparison CT.   Electronically Signed   By: Genevive BiStewart  Edmunds M.D.   On: 05/15/2013 11:25     EKG Interpretation None      MDM  Final diagnoses:  None   This is a 53 year old man with right flank pain and ureteral obstruction with 5 and 6 mm ureteral stones present. He does not have leukocytes in his urine, however is likely not draining urine the ureter. The kidneys appear to be inflamed and could have pyelonephritis on the right kidney. Urine will be cultured and he is having 1 g of Rocephin started. He is also hypertensive here with a history of hypertension. His blood pressure has come down some with improved pain control. I think it is likely that his baseline is significantly elevated due to pain. He is not currently taking any medication for hypertension or diabetes. I discussed the patient's care with Dr. Marlou PorchHerrick and the patient will be transferred [facility] for stent placement. I've also discussed the patient's care with Dr. Rubin PayorPickering and he is aware the patient is coming to [facility].   Hilario Quarryanielle S Katria Botts, MD 05/16/13 [phone]

## 2013-05-15 NOTE — Anesthesia Postprocedure Evaluation (Signed)
  Anesthesia Post-op Note  Patient: Michael Carroll  Procedure(s) Performed: Procedure(s) (LRB): CYSTOSCOPY WITH RETROGRADE PYELOGRAM, URETEROSCOPY AND STENT PLACEMENT (Right)  Patient Location: PACU  Anesthesia Type: General  Level of Consciousness: awake and alert   Airway and Oxygen Therapy: Patient Spontanous Breathing  Post-op Pain: mild  Post-op Assessment: Post-op Vital signs reviewed, Patient's Cardiovascular Status Stable, Respiratory Function Stable, Patent Airway and No signs of Nausea or vomiting  Last Vitals:  Filed Vitals:   05/15/13 2100  BP: 172/92  Pulse:   Temp: 37.2 C  Resp:     Post-op Vital Signs: stable   Complications: No apparent anesthesia complications

## 2013-05-15 NOTE — ED Notes (Signed)
Report to ptar emt's. Pt updated on plan of care, pending transport to [facility] for urology consult.

## 2013-05-15 NOTE — ED Notes (Signed)
MD at bedside. 

## 2013-05-15 NOTE — ED Notes (Signed)
Pt is in radiology will obtain vital signs as ordered  Upon his return to exam room

## 2013-05-15 NOTE — ED Notes (Signed)
[facility] phoned for pt transport to [facility] ed to be evaluated by urology.

## 2013-05-15 NOTE — ED Notes (Signed)
Reports abdominal pain right upper quadrant for 2-3 weeks. Also having right flank pain onset yesterday becoming more severe last night and this morning

## 2013-05-15 NOTE — H&P (Signed)
I have been asked to see the patient by Dr. Margarita Grizzleanielle ray, M.D., for evaluation and management of right obstructing distal ureteral stones.  History of present illness: Is a 53 year old male with a long history of nephrolithiasis who presented to the [facility] with 3 days of acute onset right-sided flank/lower abdominal pain. The patient has had intermittent pain for the past 3 months, he has passed several stones that time. The patient tells me that he has had 10 ureteral stents in the past and had multiple shockwave lithotripsy and ureter[facility]. The patient currently is in 10/10 pain. The pain is predominantly in his right flank and right lower quadrant. It radiates down to his groin. He denies any fevers/chills. He denies any dysuria, urgency, or frequency. He has noted a small amount of gross hematuria.  The patient is otherwise relatively healthy, he describes hypertension and diet-controlled diabetes.  Review of systems: A 12 point comprehensive review of systems was obtained and is negative unless otherwise stated in the history of present illness.  Patient Active Problem List   Diagnosis Date Noted  . Chronic cholecystitis with calculus 03/26/2012    No current facility-administered medications on file prior to encounter.   Current Outpatient Prescriptions on File Prior to Encounter  Medication Sig Dispense Refill  . aspirin 81 MG tablet Take 81 mg by mouth every evening.         Past Medical History  Diagnosis Date  . Hypertension   . Diabetes mellitus   . Headache(784.0)     migraine history  . Cirrhosis of liver without mention of alcohol   . Kidney stones     Past Surgical History  Procedure Laterality Date  . Stents      kidney stones  . Cholecystectomy N/A 03/26/2012    Procedure: LAPAROSCOPIC CHOLECYSTECTOMY;  Surgeon: Romie LeveeAlicia Thomas, MD;  Location[facility];  Service: General;  Laterality: N/A;  . Liver biopsy N/A  03/26/2012    Procedure: LIVER BIOPSY;  Surgeon: Romie LeveeAlicia Thomas, MD;  Location: [facility];  Service: General;  Laterality: N/A;    History  Substance Use Topics  . Smoking status: Current Every Day Smoker -- 1.50 packs/day    Types: Cigarettes  . Smokeless tobacco: Not on file  . Alcohol Use: No    Family History  Problem Relation Age of Onset  . Cancer - Lung Mother   . Cancer Mother   . Seizures Sister   . Heart disease Father     PE: Filed Vitals:   05/15/13 1702  BP: 178/75  Pulse: 56  Temp: 98.2 F (36.8 C)  Resp: 20    Patient appears to be in no acute distress  patient is alert and oriented x3 Atraumatic normocephalic head No cervical or supraclavicular lymphadenopathy appreciated No increased work of breathing, no audible wheezes/rhonchi Regular sinus rhythm/rate Abdomen is soft, nontender, nondistended, no CVA or suprapubic tenderness Lower string is are symmetric without appreciable edema Grossly neurologically intact No identifiable skin lesions   Recent Labs  05/15/13 1000  WBC 6.5  HGB 18.0*  HCT 51.9    Recent Labs  05/15/13 1000  NA 142  K 4.5  CL 102  CO2 29  GLUCOSE 204*  BUN 20  CREATININE 0.90  CALCIUM 10.2   No results found for this basename: LABPT, INR,  in the last 72 hours No results found for this basename: LABURIN,  in the last 72 hours Results for  orders placed during the hospital encounter of 04/17/13  STONE ANALYSIS     Status: None   Collection Time    04/17/13 11:05 PM      Result Value Ref Range Status   Nidus Not observed   Final   Component 1 KSTONE See Below   Final   Comment: (NOTE)     Calcium Oxalate Dihydrate ([place]) 95%     Calcium Oxalate Monohydrate ([place]) 5%   Stone Weight KSTONE 0.0900   Final   Comment: Performed at [facility]    Imaging: CT abdomen/pelvis, noncontrast stone protocol: I've independently reviewed these images. They show a severely dilated right upper urinary  collecting system with 2 obstructing stones in the mid ureter.  Imp: Right obstructing ureteral stones with uncontrolled pain and associated hydronephrosis.  Recommendations: Aware of the risk and benefits of urgent ureteroscopy/laser lithotripsy/stent placement. I cautioned the patient that stones may be impacted and difficult to extract, at which point we will leave the stent and come back in 2 weeks. The patient has been through this procedure before as well where the risk and benefits, I reiterated them.   Crist FatBenjamin W Herrick

## 2013-05-16 ENCOUNTER — Encounter (HOSPITAL_COMMUNITY): Payer: Self-pay | Admitting: Urology

## 2013-05-16 LAB — URINE CULTURE
Colony Count: NO GROWTH
Colony Count: NO GROWTH
Culture: NO GROWTH
Culture: NO GROWTH

## 2013-05-21 ENCOUNTER — Emergency Department (HOSPITAL_COMMUNITY)
Admission: EM | Admit: 2013-05-21 | Discharge: 2013-05-21 | Disposition: A | Discharge status: Home / Self Care | Payer: Medicaid Other | Attending: Emergency Medicine | Admitting: Emergency Medicine

## 2013-05-21 ENCOUNTER — Encounter (HOSPITAL_COMMUNITY): Payer: Self-pay | Admitting: Emergency Medicine

## 2013-05-21 ENCOUNTER — Other Ambulatory Visit: Payer: Self-pay | Admitting: Urology

## 2013-05-21 ENCOUNTER — Emergency Department (HOSPITAL_COMMUNITY): Payer: Medicaid Other

## 2013-05-21 DIAGNOSIS — R112 Nausea with vomiting, unspecified: Secondary | ICD-10-CM | POA: Insufficient documentation

## 2013-05-21 DIAGNOSIS — I10 Essential (primary) hypertension: Secondary | ICD-10-CM | POA: Insufficient documentation

## 2013-05-21 DIAGNOSIS — R509 Fever, unspecified: Secondary | ICD-10-CM | POA: Insufficient documentation

## 2013-05-21 DIAGNOSIS — R319 Hematuria, unspecified: Secondary | ICD-10-CM | POA: Insufficient documentation

## 2013-05-21 DIAGNOSIS — Z87442 Personal history of urinary calculi: Secondary | ICD-10-CM | POA: Insufficient documentation

## 2013-05-21 DIAGNOSIS — F17200 Nicotine dependence, unspecified, uncomplicated: Secondary | ICD-10-CM | POA: Insufficient documentation

## 2013-05-21 DIAGNOSIS — E119 Type 2 diabetes mellitus without complications: Secondary | ICD-10-CM | POA: Insufficient documentation

## 2013-05-21 DIAGNOSIS — Z8719 Personal history of other diseases of the digestive system: Secondary | ICD-10-CM | POA: Insufficient documentation

## 2013-05-21 DIAGNOSIS — R300 Dysuria: Secondary | ICD-10-CM | POA: Insufficient documentation

## 2013-05-21 DIAGNOSIS — R109 Unspecified abdominal pain: Secondary | ICD-10-CM | POA: Insufficient documentation

## 2013-05-21 DIAGNOSIS — Z9089 Acquired absence of other organs: Secondary | ICD-10-CM | POA: Insufficient documentation

## 2013-05-21 DIAGNOSIS — Z7982 Long term (current) use of aspirin: Secondary | ICD-10-CM | POA: Insufficient documentation

## 2013-05-21 DIAGNOSIS — Z79899 Other long term (current) drug therapy: Secondary | ICD-10-CM | POA: Insufficient documentation

## 2013-05-21 LAB — URINALYSIS, ROUTINE W REFLEX MICROSCOPIC
Bilirubin Urine: NEGATIVE
GLUCOSE, UA: 100 mg/dL — AB
Ketones, ur: NEGATIVE mg/dL
Nitrite: NEGATIVE
PH: 6 (ref 5.0–8.0)
Protein, ur: >300 mg/dL — AB
Specific Gravity, Urine: 1.026 (ref 1.005–1.030)
Urobilinogen, UA: 1 mg/dL (ref 0.0–1.0)

## 2013-05-21 LAB — CBC
HEMATOCRIT: 48.4 % (ref 39.0–52.0)
HEMOGLOBIN: 16.7 g/dL (ref 13.0–17.0)
MCH: 29.5 pg (ref 26.0–34.0)
MCHC: 34.5 g/dL (ref 30.0–36.0)
MCV: 85.5 fL (ref 78.0–100.0)
Platelets: 146 10*3/uL — ABNORMAL LOW (ref 150–400)
RBC: 5.66 MIL/uL (ref 4.22–5.81)
RDW: 12.7 % (ref 11.5–15.5)
WBC: 9.4 10*3/uL (ref 4.0–10.5)

## 2013-05-21 LAB — COMPREHENSIVE METABOLIC PANEL
ALK PHOS: 76 U/L (ref 39–117)
ALT: 78 U/L — ABNORMAL HIGH (ref 0–53)
AST: 52 U/L — ABNORMAL HIGH (ref 0–37)
Albumin: 3.6 g/dL (ref 3.5–5.2)
BUN: 21 mg/dL (ref 6–23)
CHLORIDE: 102 meq/L (ref 96–112)
CO2: 28 mEq/L (ref 19–32)
Calcium: 9.4 mg/dL (ref 8.4–10.5)
Creatinine, Ser: 0.8 mg/dL (ref 0.50–1.35)
GLUCOSE: 177 mg/dL — AB (ref 70–99)
POTASSIUM: 4.2 meq/L (ref 3.7–5.3)
Sodium: 142 mEq/L (ref 137–147)
Total Bilirubin: 0.5 mg/dL (ref 0.3–1.2)
Total Protein: 7.3 g/dL (ref 6.0–8.3)

## 2013-05-21 LAB — CBG MONITORING, ED: Glucose-Capillary: 227 mg/dL — ABNORMAL HIGH (ref 70–99)

## 2013-05-21 LAB — URINE MICROSCOPIC-ADD ON

## 2013-05-21 MED ORDER — NAPROXEN 500 MG PO TABS: 500.0000 mg | ORAL_TABLET | Freq: Two times a day (BID) | ORAL | Status: DC

## 2013-05-21 MED ORDER — HYDROMORPHONE HCL PF 2 MG/ML IJ SOLN
2.0000 mg | Freq: Once | INTRAMUSCULAR | Status: AC
Start: 2013-05-21 — End: 2013-05-21
  Administered 2013-05-21: 2 mg via INTRAVENOUS
  Filled 2013-05-21: qty 1

## 2013-05-21 MED ORDER — PROMETHAZINE HCL 25 MG/ML IJ SOLN
25.0000 mg | Freq: Once | INTRAMUSCULAR | Status: AC
  Administered 2013-05-21: 25 mg via INTRAVENOUS
  Filled 2013-05-21: qty 1

## 2013-05-21 MED ORDER — KETOROLAC TROMETHAMINE 30 MG/ML IJ SOLN
30.0000 mg | Freq: Once | INTRAMUSCULAR | Status: AC
  Administered 2013-05-21: 30 mg via INTRAVENOUS
  Filled 2013-05-21: qty 1

## 2013-05-21 NOTE — ED Provider Notes (Signed)
  This was a shared visit with a mid-level provided (NP or PA).  Throughout the patient's course I was available for consultation/collaboration.  I saw the ECG (if appropriate), relevant labs and studies - I agree with the interpretation.  On my exam the patient was in no distress.  He had received meds and was feeling "better".  After his eval was largely reassuring, we discussed his case with his urology team and he was d/c to f/u with them tomorrow.     Gerhard Munchobert Ameka Krigbaum, MD 05/21/13 2322

## 2013-05-21 NOTE — ED Provider Notes (Signed)
CSN: [id number]     Arrival date & time 05/21/13  1819 History   First MD Initiated Contact with Patient 05/21/13 1858     Chief Complaint  Patient presents with  . Abdominal Pain  . Dysuria     (Consider location/radiation/quality/duration/timing/severity/associated sxs/prior Treatment) HPI 53 year old male who was recently had a renal stent placed 6 days ago for an obstructive 5mm and 6 mm ureteral stone in the right ureter is here complaining of worsening right flank pain along with dysuria and hematuria. Patient states since the stent placement he has had persistent pain which he described as a sharp and throbbing sensation. Pain initially was controlled with hydrocodone but it got worse so his Dr. prescribed Dilaudid 4 mg every 4 hours. He has been taking more than the recommended dose with minimal improvement for the past several days. He endorsed pain is intense causing him to have nausea and vomiting. He endorsed subjective fever and noticed increase hematuria. He notified his doctor who recommend him to come to the ER for further evaluation and to check for placement of the stent. States he has had prior stenting for kidney stone the past but has never had this much pain. He denies any chest pain shortness of breath, scrotal swelling, or rash.     Past Medical History  Diagnosis Date  . Hypertension   . Diabetes mellitus   . Headache(784.0)     migraine history  . Cirrhosis of liver without mention of alcohol   . Kidney stones    Past Surgical History  Procedure Laterality Date  . Stents      kidney stones  . Cholecystectomy N/A 03/26/2012    Procedure: LAPAROSCOPIC CHOLECYSTECTOMY;  Surgeon: Romie LeveeAlicia Thomas, MD;  Location: [facility];  Service: General;  Laterality: N/A;  . Liver biopsy N/A 03/26/2012    Procedure: LIVER BIOPSY;  Surgeon: Romie LeveeAlicia Thomas, MD;  Location: [facility];  Service: General;  Laterality: N/A;  . Cystoscopy with retrograde pyelogram, ureteroscopy and stent  placement Right 05/15/2013    Procedure: CYSTOSCOPY WITH RETROGRADE PYELOGRAM, URETEROSCOPY AND STENT PLACEMENT;  Surgeon: Crist FatBenjamin W Herrick, MD;  Location: [facility];  Service: Urology;  Laterality: Right;   Family History  Problem Relation Age of Onset  . Cancer - Lung Mother   . Cancer Mother   . Seizures Sister   . Heart disease Father    History  Substance Use Topics  . Smoking status: Current Every Day Smoker -- 1.50 packs/day    Types: Cigarettes  . Smokeless tobacco: Not on file  . Alcohol Use: No    Review of Systems  All other systems reviewed and are negative.     Allergies  Zofran and Compazine  Home Medications   Prior to Admission medications   Medication Sig Start Date End Date Taking? Authorizing Provider  aspirin 81 MG tablet Take 81 mg by mouth every evening.     Historical Provider, MD  hydrochlorothiazide (HYDRODIURIL) 25 MG tablet Take 1 tablet (25 mg total) by mouth daily. 05/15/13   Hilario Quarryanielle S Ray, MD  HYDROcodone-acetaminophen (NORCO/VICODIN) 5-325 MG per tablet Take 1-2 tablets by mouth every 6 (six) hours as needed. 05/15/13   Crist FatBenjamin W Herrick, MD  metFORMIN (GLUCOPHAGE) 500 MG tablet Take 1 tablet (500 mg total) by mouth 2 (two) times daily with a meal. 05/15/13   Hilario Quarryanielle S Ray, MD  tamsulosin (FLOMAX) 0.4 MG CAPS capsule Take 1 capsule (0.4 mg total) by mouth daily. 05/15/13   Sharlet SalinaBenjamin  Lou MinerW Herrick, MD  tetrahydrozoline-zinc (VISINE-AC) 0.05-0.25 % ophthalmic solution Place 1 drop into both eyes daily as needed (for dry eyes).    Historical Provider, MD   BP 168/96  Pulse 95  Temp(Src) 98 F (36.7 C) (Oral)  Resp 16  SpO2 97% Physical Exam  Constitutional: He is oriented to person, place, and time. He appears well-developed and well-nourished. No distress.  HENT:  Head: Atraumatic.  Eyes: Conjunctivae are normal.  Neck: Normal range of motion. Neck supple.  Abdominal: Soft. There is no tenderness. There is no rebound and no guarding.   Genitourinary: Penis normal. No penile tenderness.  Mild right CVA tenderness  Neurological: He is alert and oriented to person, place, and time.  Skin: No rash noted.  Psychiatric: He has a normal mood and affect.    ED Course  Procedures (including critical care time)  7:18 PM Pt has severe hydronephrosis to R ureter 2/2 to ureteral stone s/p renal stenting 6 days ago.  Here with worsening pain to R flank/abdomen concerning for migration of renal stent.  Work up initiated.   8:47 PM Patient is afebrile with stable vital sign. Mildly hypertensive. Patient has normal renal function, UA with greater than 300 protein and large hemoglobin and urine dipsticks with moderate leukocytes but no obvious signs of urinary tract infection. Normal WBC. X-rays demonstrate renal stents appears to be in satisfactory position. Care discussed with Dr. Jeraldine LootsLockwood. Plan to consult urology for further recommendation.  9:02 PM I have consulted with urologist Dr. Mena GoesEskridge who recommend giving pt toradol and have pt f/u with Dr. Marlou PorchHerrick tomorrow for further management.     Labs Review Labs Reviewed  URINALYSIS, ROUTINE W REFLEX MICROSCOPIC - Abnormal; Notable for the following:    Color, Urine RED (*)    APPearance CLOUDY (*)    Glucose, UA 100 (*)    Hgb urine dipstick LARGE (*)    Protein, ur >300 (*)    Leukocytes, UA MODERATE (*)    All other components within normal limits  CBC - Abnormal; Notable for the following:    Platelets 146 (*)    All other components within normal limits  COMPREHENSIVE METABOLIC PANEL - Abnormal; Notable for the following:    Glucose, Bld 177 (*)    AST 52 (*)    ALT 78 (*)    All other components within normal limits  CBG MONITORING, ED - Abnormal; Notable for the following:    Glucose-Capillary 227 (*)    All other components within normal limits  URINE MICROSCOPIC-ADD ON    Imaging Review Dg Abd 1 View  05/21/2013   CLINICAL DATA:  Abdominal pain, kidney  stones, evaluate right sided renal stent  EXAM: ABDOMEN - 1 VIEW  COMPARISON:  CT abdomen pelvis dated 05/15/2013  FINDINGS: Right double-pigtail ureteral stent in satisfactory position.  No definite ureteral calculi. Tiny calcifications lateral to the distal right ureteral stent are favored to reflect pelvic phleboliths.  Nonobstructive bowel gas pattern.  Cholecystectomy clips.  Visualized osseous structures are within normal limits.  IMPRESSION: Vital pigtail ureteral stent in satisfactory position.  No definite ureteral calculi.   Electronically Signed   By: Charline BillsSriyesh  Krishnan M.D.   On: 05/21/2013 19:52     EKG Interpretation None      MDM   Final diagnoses:  Right flank pain    BP 168/96  Pulse 95  Temp(Src) 98 F (36.7 C) (Oral)  Resp 16  SpO2 97%  I have reviewed  nursing notes and vital signs. I personally reviewed the imaging tests through PACS system  I reviewed available ER/hospitalization records thought the EMR     Fayrene Helper, [place] 05/21/13 2128

## 2013-05-21 NOTE — ED Notes (Signed)
Pt had internal renal stent placed week ago for kidney blockage due to kidney stone. Pt states pain has increased since then is now worse than prior to stent placement. Called urology, sent here for possible stent movement. Pt states he is having urgency and dysuria.

## 2013-05-21 NOTE — Discharge Instructions (Signed)
Please follow up with Dr. Marlou PorchHerrick tomorrow for further management of your renal stent  Flank Pain Flank pain refers to pain that is located on the side of the body between the upper abdomen and the back. The pain may occur over a short period of time (acute) or may be long-term or reoccurring (chronic). It may be mild or severe. Flank pain can be caused by many things. CAUSES  Some of the more common causes of flank pain include:  Muscle strains.   Muscle spasms.   A disease of your spine (vertebral disk disease).   A lung infection (pneumonia).   Fluid around your lungs (pulmonary edema).   A kidney infection.   Kidney stones.   A very painful skin rash caused by the chickenpox virus (shingles).   Gallbladder disease.  HOME CARE INSTRUCTIONS  Home care will depend on the cause of your pain. In general,  Rest as directed by your caregiver.  Drink enough fluids to keep your urine clear or pale yellow.  Only take over-the-counter or prescription medicines as directed by your caregiver. Some medicines may help relieve the pain.  Tell your caregiver about any changes in your pain.  Follow up with your caregiver as directed. SEEK IMMEDIATE MEDICAL CARE IF:   Your pain is not controlled with medicine.   You have new or worsening symptoms.  Your pain increases.   You have abdominal pain.   You have shortness of breath.   You have persistent nausea or vomiting.   You have swelling in your abdomen.   You feel faint or pass out.   You have blood in your urine.  You have a fever or persistent symptoms for more than 2 3 days.  You have a fever and your symptoms suddenly get worse. MAKE SURE YOU:   Understand these instructions.  Will watch your condition.  Will get help right away if you are not doing well or get worse. Document Released: 02/25/2005 Document Revised: 09/29/2011 Document Reviewed: 08/19/2011 [facility] Patient Information 2014  [employer], [place].

## 2013-05-24 ENCOUNTER — Encounter (HOSPITAL_BASED_OUTPATIENT_CLINIC_OR_DEPARTMENT_OTHER): Payer: Self-pay | Admitting: *Deleted

## 2013-05-27 ENCOUNTER — Encounter (HOSPITAL_BASED_OUTPATIENT_CLINIC_OR_DEPARTMENT_OTHER): Payer: Self-pay | Admitting: Emergency Medicine

## 2013-05-27 ENCOUNTER — Emergency Department (HOSPITAL_BASED_OUTPATIENT_CLINIC_OR_DEPARTMENT_OTHER)
Admission: EM | Admit: 2013-05-27 | Discharge: 2013-05-27 | Disposition: A | Discharge status: Home / Self Care | Payer: Medicaid Other | Attending: Emergency Medicine | Admitting: Emergency Medicine

## 2013-05-27 DIAGNOSIS — Z87442 Personal history of urinary calculi: Secondary | ICD-10-CM | POA: Insufficient documentation

## 2013-05-27 DIAGNOSIS — N23 Unspecified renal colic: Secondary | ICD-10-CM | POA: Insufficient documentation

## 2013-05-27 DIAGNOSIS — E119 Type 2 diabetes mellitus without complications: Secondary | ICD-10-CM | POA: Insufficient documentation

## 2013-05-27 DIAGNOSIS — Z9889 Other specified postprocedural states: Secondary | ICD-10-CM | POA: Insufficient documentation

## 2013-05-27 DIAGNOSIS — I10 Essential (primary) hypertension: Secondary | ICD-10-CM | POA: Insufficient documentation

## 2013-05-27 DIAGNOSIS — F17200 Nicotine dependence, unspecified, uncomplicated: Secondary | ICD-10-CM | POA: Insufficient documentation

## 2013-05-27 DIAGNOSIS — Z8719 Personal history of other diseases of the digestive system: Secondary | ICD-10-CM | POA: Insufficient documentation

## 2013-05-27 DIAGNOSIS — Z7982 Long term (current) use of aspirin: Secondary | ICD-10-CM | POA: Insufficient documentation

## 2013-05-27 DIAGNOSIS — Z79899 Other long term (current) drug therapy: Secondary | ICD-10-CM | POA: Insufficient documentation

## 2013-05-27 DIAGNOSIS — Z8679 Personal history of other diseases of the circulatory system: Secondary | ICD-10-CM | POA: Insufficient documentation

## 2013-05-27 LAB — URINALYSIS, ROUTINE W REFLEX MICROSCOPIC
Glucose, UA: 100 mg/dL — AB
KETONES UR: 15 mg/dL — AB
Nitrite: NEGATIVE
Protein, ur: 100 mg/dL — AB
SPECIFIC GRAVITY, URINE: 1.025 (ref 1.005–1.030)
UROBILINOGEN UA: 2 mg/dL — AB (ref 0.0–1.0)
pH: 6 (ref 5.0–8.0)

## 2013-05-27 LAB — URINE MICROSCOPIC-ADD ON

## 2013-05-27 MED ORDER — HYDROMORPHONE HCL 4 MG PO TABS: 8.0000 mg | ORAL_TABLET | Freq: Four times a day (QID) | ORAL | Status: DC | PRN

## 2013-05-27 MED ORDER — SODIUM CHLORIDE 0.9 % IV SOLN
Freq: Once | INTRAVENOUS | Status: AC
  Administered 2013-05-27: 21:00:00 via INTRAVENOUS

## 2013-05-27 MED ORDER — HYDROMORPHONE HCL PF 1 MG/ML IJ SOLN
0.5000 mg | Freq: Once | INTRAMUSCULAR | Status: AC
  Administered 2013-05-27: 0.5 mg via INTRAVENOUS
  Filled 2013-05-27: qty 1

## 2013-05-27 MED ORDER — HYDROMORPHONE HCL PF 1 MG/ML IJ SOLN
1.0000 mg | Freq: Once | INTRAMUSCULAR | Status: AC
  Administered 2013-05-27: 1 mg via INTRAVENOUS
  Filled 2013-05-27: qty 1

## 2013-05-27 MED ORDER — PROMETHAZINE HCL 25 MG PO TABS: 25.0000 mg | ORAL_TABLET | Freq: Four times a day (QID) | ORAL | Status: DC | PRN

## 2013-05-27 MED ORDER — PROMETHAZINE HCL 25 MG/ML IJ SOLN
12.5000 mg | Freq: Once | INTRAMUSCULAR | Status: AC
  Administered 2013-05-27: 12.5 mg via INTRAVENOUS
  Filled 2013-05-27: qty 1

## 2013-05-27 NOTE — ED Provider Notes (Signed)
CSN: [id number]     Arrival date & time 05/27/13  1912 History   First MD Initiated Contact with Patient 05/27/13 2036     Chief Complaint  Patient presents with  . Flank Pain     (Consider location/radiation/quality/duration/timing/severity/associated sxs/prior Treatment) Patient is a 53 y.o. male presenting with flank pain. The history is provided by the patient. No language interpreter was used.  Flank Pain This is a chronic problem. Associated symptoms include nausea and vomiting. Pertinent negatives include no chest pain, chills or fever. Associated symptoms comments: The patient has a history or multiple kidney stones requiring surgical intervention. He currently has a ureteral stent in place on the right, placed 05/21/13 by Dr. Marlou PorchHerrick. He presents tonight with uncontrolled flank pain, persistent hematuria. No fever. His nausea has been worse with this episode of stones than usual. He is out of his pain medication at home. .    Past Medical History  Diagnosis Date  . Hypertension   . Headache(784.0)     migraine history  . Cirrhosis of liver without mention of alcohol   . Right ureteral stone   . History of kidney stones   . Type 2 diabetes, diet controlled    Past Surgical History  Procedure Laterality Date  . Cholecystectomy N/A 03/26/2012    Procedure: LAPAROSCOPIC CHOLECYSTECTOMY;  Surgeon: Romie LeveeAlicia Thomas, MD;  Location: [facility];  Service: General;  Laterality: N/A;  . Liver biopsy N/A 03/26/2012    Procedure: LIVER BIOPSY;  Surgeon: Romie LeveeAlicia Thomas, MD;  Location: [facility];  Service: General;  Laterality: N/A;  . Cystoscopy with retrograde pyelogram, ureteroscopy and stent placement Right 05/15/2013    Procedure: CYSTOSCOPY WITH RETROGRADE PYELOGRAM, URETEROSCOPY AND STENT PLACEMENT;  Surgeon: Crist FatBenjamin W Herrick, MD;  Location: [facility];  Service: Urology;  Laterality: Right;  . Cysto/  right ureteral stent placement  06/2006  . Cysto/   right ureteral stent exchange  12/2007  .  Extracorporeal shock wave lithotripsy    . Cardiovascular stress test  2004    CARDIOLITE STUDY NORMAL/  EF 60%   Family History  Problem Relation Age of Onset  . Cancer - Lung Mother   . Cancer Mother   . Seizures Sister   . Heart disease Father    History  Substance Use Topics  . Smoking status: Current Every Day Smoker -- 1.50 packs/day    Types: Cigarettes  . Smokeless tobacco: Not on file  . Alcohol Use: No    Review of Systems  Constitutional: Negative for fever and chills.  Respiratory: Negative.  Negative for shortness of breath.   Cardiovascular: Negative.  Negative for chest pain.  Gastrointestinal: Positive for nausea and vomiting.  Genitourinary: Positive for hematuria and flank pain.  Musculoskeletal: Negative.   Skin: Negative.   Neurological: Negative.       Allergies  Zofran and Compazine  Home Medications   Prior to Admission medications   Medication Sig Start Date End Date Taking? Authorizing Provider  aspirin 81 MG tablet Take 81 mg by mouth every evening.    Yes Historical Provider, MD  HYDROmorphone (DILAUDID) 4 MG tablet Take 8 mg by mouth every 6 (six) hours as needed for severe pain.   Yes Historical Provider, MD  naproxen (NAPROSYN) 500 MG tablet Take 1 tablet (500 mg total) by mouth 2 (two) times daily. 05/21/13  Yes Fayrene HelperBowie Tran, PA-C  promethazine (PHENERGAN) 25 MG tablet Take 25 mg by mouth every 6 (six) hours as needed for nausea  or vomiting.   Yes Historical Provider, MD  tetrahydrozoline-zinc (VISINE-AC) 0.05-0.25 % ophthalmic solution Place 1 drop into both eyes daily as needed (for dry eyes).   Yes Historical Provider, MD   BP 177/105  Pulse 98  Temp(Src) 98.7 F (37.1 C) (Oral)  Resp 21  Ht 6' (1.829 m)  Wt 270 lb (122.471 kg)  BMI 36.61 kg/m2  SpO2 99% Physical Exam  Constitutional: He is oriented to person, place, and time. He appears well-developed and well-nourished.  Neck: Normal range of motion.  Pulmonary/Chest: Effort  normal.  Abdominal: Soft. Bowel sounds are normal. He exhibits no mass. There is no tenderness.  Genitourinary:  Right CVA tenderness.   Musculoskeletal: Normal range of motion.  Neurological: He is alert and oriented to person, place, and time.  Skin: Skin is warm and dry.  Psychiatric: He has a normal mood and affect.    ED Course  Procedures (including critical care time) Labs Review Labs Reviewed  URINALYSIS, ROUTINE W REFLEX MICROSCOPIC - Abnormal; Notable for the following:    Color, Urine AMBER (*)    APPearance CLOUDY (*)    Glucose, UA 100 (*)    Hgb urine dipstick LARGE (*)    Bilirubin Urine SMALL (*)    Ketones, ur 15 (*)    Protein, ur 100 (*)    Urobilinogen, UA 2.0 (*)    Leukocytes, UA MODERATE (*)    All other components within normal limits  URINE MICROSCOPIC-ADD ON - Abnormal; Notable for the following:    Bacteria, UA MANY (*)    All other components within normal limits    Imaging Review No results found.   EKG Interpretation None      MDM   Final diagnoses:  None    1. Ureteral stones  Pain controlled with IV pain medications. He has urology follow up in place. Refilled pain medications.     Arnoldo HookerShari A Telesha Deguzman, PA-C 05/31/13 1521

## 2013-05-27 NOTE — Discharge Instructions (Signed)
Ureteral Colic (Kidney Stones) °Ureteral colic is the result of a condition when kidney stones form inside the kidney. Once kidney stones are formed they may move into the tube that connects the kidney with the bladder (ureter). If this occurs, this condition may cause pain (colic) in the ureter.  °CAUSES  °Pain is caused by stone movement in the ureter and the obstruction caused by the stone. °SYMPTOMS  °The pain comes and goes as the ureter contracts around the stone. The pain is usually intense, sharp, and stabbing in character. The location of the pain may move as the stone moves through the ureter. When the stone is near the kidney the pain is usually located in the back and radiates to the belly (abdomen). When the stone is ready to pass into the bladder the pain is often located in the lower abdomen on the side the stone is located. At this location, the symptoms may mimic those of a urinary tract infection with urinary frequency. Once the stone is located here it often passes into the bladder and the pain disappears completely. °TREATMENT  °· Your caregiver will provide you with medicine for pain relief. °· You may require specialized follow-up X-rays. °· The absence of pain does not always mean that the stone has passed. It may have just stopped moving. If the urine remains completely obstructed, it can cause loss of kidney function or even complete destruction of the involved kidney. It is your responsibility and in your interest that X-rays and follow-ups as suggested by your caregiver are completed. Relief of pain without passage of the stone can be associated with severe damage to the kidney, including loss of kidney function on that side. °· If your stone does not pass on its own, additional measures may be taken by your caregiver to ensure its removal. °HOME CARE INSTRUCTIONS  °· Increase your fluid intake. Water is the preferred fluid since juices containing vitamin C may acidify the urine making it  less likely for certain stones (uric acid stones) to pass. °· Strain all urine. A strainer will be provided. Keep all particulate matter or stones for your caregiver to inspect. °· Take your pain medicine as directed. °· Make a follow-up appointment with your caregiver as directed. °· Remember that the goal is passage of your stone. The absence of pain does not mean the stone is gone. Follow your caregiver's instructions. °· Only take over-the-counter or prescription medicines for pain, discomfort, or fever as directed by your caregiver. °SEEK MEDICAL CARE IF:  °· Pain cannot be controlled with the prescribed medicine. °· You have a fever. °· Pain continues for longer than your caregiver advises it should. °· There is a change in the pain, and you develop chest discomfort or constant abdominal pain. °· You feel faint or pass out. °MAKE SURE YOU:  °· Understand these instructions. °· Will watch your condition. °· Will get help right away if you are not doing well or get worse. °Document Released: 10/14/2004 Document Revised: 05/01/2012 Document Reviewed: 07/01/2010 °Exit[employer]® Patient Information ©2014 [employer]. ° °

## 2013-05-27 NOTE — ED Notes (Signed)
Pt reports he has a renal stent in place for a kidney stone, reports he is scheduled for surgery Thursday, reports he is out is his PO Dilaudid and has been experiencing increased pain today with increase hematuria.

## 2013-05-29 ENCOUNTER — Encounter (HOSPITAL_BASED_OUTPATIENT_CLINIC_OR_DEPARTMENT_OTHER): Payer: Self-pay | Admitting: *Deleted

## 2013-05-29 NOTE — Progress Notes (Signed)
05/29/13 1639  OBSTRUCTIVE SLEEP APNEA  Have you ever been diagnosed with sleep apnea through a sleep study? No  Do you snore loudly (loud enough to be heard through closed doors)?  0  Do you often feel tired, fatigued, or sleepy during the daytime? 0  Has anyone observed you stop breathing during your sleep? 0  Do you have, or are you being treated for high blood pressure? 1  BMI more than 35 kg/m2? 1  Age over 52 years old? 1  Gender: 1  Obstructive Sleep Apnea Score 4  Score 4 or greater  Results sent to PCP

## 2013-05-29 NOTE — Progress Notes (Signed)
SPOKE W/ WIFE.  NPO AFTER MN. ARRIVE AT 1015.  NEEDS EKG.  CURRENT LAB RESULTS IN EPIC  AND CHART.

## 2013-05-30 ENCOUNTER — Ambulatory Visit (HOSPITAL_BASED_OUTPATIENT_CLINIC_OR_DEPARTMENT_OTHER)
Admission: RE | Admit: 2013-05-30 | Discharge: 2013-05-30 | Disposition: A | Discharge status: Home / Self Care | Payer: Self-pay | Source: Ambulatory Visit | Attending: Urology | Admitting: Urology

## 2013-05-30 ENCOUNTER — Encounter (HOSPITAL_BASED_OUTPATIENT_CLINIC_OR_DEPARTMENT_OTHER)
Admission: RE | Disposition: A | Discharge status: Home / Self Care | Payer: Self-pay | Source: Ambulatory Visit | Attending: Urology

## 2013-05-30 ENCOUNTER — Encounter (HOSPITAL_BASED_OUTPATIENT_CLINIC_OR_DEPARTMENT_OTHER): Payer: Self-pay | Admitting: Anesthesiology

## 2013-05-30 ENCOUNTER — Encounter (HOSPITAL_BASED_OUTPATIENT_CLINIC_OR_DEPARTMENT_OTHER): Payer: Self-pay

## 2013-05-30 ENCOUNTER — Ambulatory Visit (HOSPITAL_BASED_OUTPATIENT_CLINIC_OR_DEPARTMENT_OTHER): Payer: Medicaid Other | Admitting: Anesthesiology

## 2013-05-30 DIAGNOSIS — K7460 Unspecified cirrhosis of liver: Secondary | ICD-10-CM | POA: Insufficient documentation

## 2013-05-30 DIAGNOSIS — N201 Calculus of ureter: Secondary | ICD-10-CM | POA: Insufficient documentation

## 2013-05-30 DIAGNOSIS — E119 Type 2 diabetes mellitus without complications: Secondary | ICD-10-CM | POA: Insufficient documentation

## 2013-05-30 DIAGNOSIS — Z7982 Long term (current) use of aspirin: Secondary | ICD-10-CM | POA: Insufficient documentation

## 2013-05-30 DIAGNOSIS — N200 Calculus of kidney: Secondary | ICD-10-CM

## 2013-05-30 DIAGNOSIS — Z87442 Personal history of urinary calculi: Secondary | ICD-10-CM | POA: Insufficient documentation

## 2013-05-30 DIAGNOSIS — I10 Essential (primary) hypertension: Secondary | ICD-10-CM | POA: Insufficient documentation

## 2013-05-30 DIAGNOSIS — F17200 Nicotine dependence, unspecified, uncomplicated: Secondary | ICD-10-CM | POA: Insufficient documentation

## 2013-05-30 HISTORY — DX: Other specified personal risk factors, not elsewhere classified: Z91.89

## 2013-05-30 HISTORY — DX: Personal history of other diseases of the nervous system and sense organs: Z86.69

## 2013-05-30 HISTORY — DX: Personal history of urinary calculi: Z87.442

## 2013-05-30 HISTORY — DX: Cough: R05

## 2013-05-30 HISTORY — DX: Other specified cough: R05.8

## 2013-05-30 HISTORY — PX: HOLMIUM LASER APPLICATION: SHX5852

## 2013-05-30 HISTORY — PX: CYSTOSCOPY WITH RETROGRADE PYELOGRAM, URETEROSCOPY AND STENT PLACEMENT: SHX5789

## 2013-05-30 HISTORY — DX: Calculus of ureter: N20.1

## 2013-05-30 HISTORY — DX: Type 2 diabetes mellitus without complications: E11.9

## 2013-05-30 HISTORY — DX: Simple chronic bronchitis: J41.0

## 2013-05-30 LAB — GLUCOSE, CAPILLARY: Glucose-Capillary: 115 mg/dL — ABNORMAL HIGH (ref 70–99)

## 2013-05-30 SURGERY — CYSTOURETEROSCOPY, WITH RETROGRADE PYELOGRAM AND STENT INSERTION
Anesthesia: General | Site: Ureter | Laterality: Right

## 2013-05-30 MED ORDER — NAPROXEN 500 MG PO TABS: 500.0000 mg | ORAL_TABLET | Freq: Two times a day (BID) | ORAL | Status: DC | PRN

## 2013-05-30 MED ORDER — LACTATED RINGERS IV SOLN
INTRAVENOUS | Status: DC
  Administered 2013-05-30: 15:00:00 via INTRAVENOUS
  Filled 2013-05-30: qty 1000

## 2013-05-30 MED ORDER — MEPERIDINE HCL 25 MG/ML IJ SOLN
6.2500 mg | INTRAMUSCULAR | Status: DC | PRN
  Filled 2013-05-30: qty 1

## 2013-05-30 MED ORDER — OXYCODONE HCL 5 MG PO TABS: 5.0000 mg | ORAL_TABLET | ORAL | Status: DC | PRN

## 2013-05-30 MED ORDER — BELLADONNA ALKALOIDS-OPIUM 16.2-60 MG RE SUPP
RECTAL | Status: DC | PRN
  Administered 2013-05-30: 1 via RECTAL

## 2013-05-30 MED ORDER — MIDAZOLAM HCL 5 MG/5ML IJ SOLN
INTRAMUSCULAR | Status: DC | PRN
  Administered 2013-05-30: 2 mg via INTRAVENOUS

## 2013-05-30 MED ORDER — CIPROFLOXACIN IN D5W 400 MG/200ML IV SOLN
400.0000 mg | INTRAVENOUS | Status: AC
  Administered 2013-05-30: 400 mg via INTRAVENOUS
  Filled 2013-05-30: qty 200

## 2013-05-30 MED ORDER — PROMETHAZINE HCL 25 MG/ML IJ SOLN
INTRAMUSCULAR | Status: AC
  Filled 2013-05-30: qty 1

## 2013-05-30 MED ORDER — PROMETHAZINE HCL 25 MG/ML IJ SOLN
INTRAMUSCULAR | Status: DC | PRN
  Administered 2013-05-30: 12.5 mg via INTRAVENOUS

## 2013-05-30 MED ORDER — HYDROMORPHONE HCL PF 1 MG/ML IJ SOLN
INTRAMUSCULAR | Status: AC
  Filled 2013-05-30: qty 1

## 2013-05-30 MED ORDER — LACTATED RINGERS IV SOLN
INTRAVENOUS | Status: DC
  Administered 2013-05-30 (×2): via INTRAVENOUS
  Filled 2013-05-30: qty 1000

## 2013-05-30 MED ORDER — PHENAZOPYRIDINE HCL 200 MG PO TABS: 200.0000 mg | ORAL_TABLET | Freq: Three times a day (TID) | ORAL | Status: DC | PRN

## 2013-05-30 MED ORDER — OXYCODONE HCL 5 MG PO TABS
5.0000 mg | ORAL_TABLET | ORAL | Status: DC | PRN
  Administered 2013-05-30 (×2): 5 mg via ORAL
  Filled 2013-05-30: qty 1

## 2013-05-30 MED ORDER — KETOROLAC TROMETHAMINE 30 MG/ML IJ SOLN
INTRAMUSCULAR | Status: DC | PRN
  Administered 2013-05-30: 30 mg via INTRAVENOUS

## 2013-05-30 MED ORDER — MIDAZOLAM HCL 2 MG/2ML IJ SOLN
INTRAMUSCULAR | Status: AC
  Filled 2013-05-30: qty 2

## 2013-05-30 MED ORDER — LIDOCAINE HCL (CARDIAC) 20 MG/ML IV SOLN
INTRAVENOUS | Status: DC | PRN
  Administered 2013-05-30: 100 mg via INTRAVENOUS

## 2013-05-30 MED ORDER — FENTANYL CITRATE 0.05 MG/ML IJ SOLN
INTRAMUSCULAR | Status: DC | PRN
  Administered 2013-05-30 (×4): 25 ug via INTRAVENOUS
  Administered 2013-05-30 (×3): 50 ug via INTRAVENOUS

## 2013-05-30 MED ORDER — TAMSULOSIN HCL 0.4 MG PO CAPS
ORAL_CAPSULE | ORAL | Status: AC
  Filled 2013-05-30: qty 1

## 2013-05-30 MED ORDER — TROSPIUM CHLORIDE ER 60 MG PO CP24: 60.0000 mg | ORAL_CAPSULE | Freq: Every day | ORAL | Status: DC

## 2013-05-30 MED ORDER — LIDOCAINE HCL 2 % EX GEL
CUTANEOUS | Status: DC | PRN
  Administered 2013-05-30: 1 via URETHRAL

## 2013-05-30 MED ORDER — PHENAZOPYRIDINE HCL 200 MG PO TABS
200.0000 mg | ORAL_TABLET | Freq: Three times a day (TID) | ORAL | Status: DC
  Administered 2013-05-30 (×2): 100 mg via ORAL
  Filled 2013-05-30: qty 1

## 2013-05-30 MED ORDER — CIPROFLOXACIN HCL 500 MG PO TABS: 500.0000 mg | ORAL_TABLET | Freq: Once | ORAL | Status: DC

## 2013-05-30 MED ORDER — TAMSULOSIN HCL 0.4 MG PO CAPS: 0.4000 mg | ORAL_CAPSULE | Freq: Every day | ORAL | Status: DC

## 2013-05-30 MED ORDER — OXYCODONE HCL 5 MG PO TABS
ORAL_TABLET | ORAL | Status: AC
  Filled 2013-05-30: qty 2

## 2013-05-30 MED ORDER — BELLADONNA ALKALOIDS-OPIUM 16.2-60 MG RE SUPP
RECTAL | Status: AC
  Filled 2013-05-30: qty 1

## 2013-05-30 MED ORDER — FENTANYL CITRATE 0.05 MG/ML IJ SOLN
INTRAMUSCULAR | Status: AC
  Filled 2013-05-30: qty 4

## 2013-05-30 MED ORDER — HYDROMORPHONE HCL PF 1 MG/ML IJ SOLN
0.2500 mg | INTRAMUSCULAR | Status: DC | PRN
  Filled 2013-05-30: qty 1

## 2013-05-30 MED ORDER — FENTANYL CITRATE 0.05 MG/ML IJ SOLN
INTRAMUSCULAR | Status: AC
  Filled 2013-05-30: qty 2

## 2013-05-30 MED ORDER — SODIUM CHLORIDE 0.9 % IR SOLN
Status: DC | PRN
  Administered 2013-05-30: 4000 mL

## 2013-05-30 MED ORDER — IOHEXOL 350 MG/ML SOLN
INTRAVENOUS | Status: DC | PRN
  Administered 2013-05-30: 24 mL

## 2013-05-30 MED ORDER — TAMSULOSIN HCL 0.4 MG PO CAPS
0.4000 mg | ORAL_CAPSULE | Freq: Every day | ORAL | Status: DC
  Administered 2013-05-30: 0.4 mg via ORAL
  Filled 2013-05-30: qty 1

## 2013-05-30 MED ORDER — GLYCOPYRROLATE 0.2 MG/ML IJ SOLN
INTRAMUSCULAR | Status: DC | PRN
  Administered 2013-05-30: 0.2 mg via INTRAVENOUS

## 2013-05-30 MED ORDER — DEXAMETHASONE SODIUM PHOSPHATE 4 MG/ML IJ SOLN
INTRAMUSCULAR | Status: DC | PRN
  Administered 2013-05-30: 10 mg via INTRAVENOUS

## 2013-05-30 MED ORDER — ACETAMINOPHEN 10 MG/ML IV SOLN
INTRAVENOUS | Status: DC | PRN
  Administered 2013-05-30: 500 mg via INTRAVENOUS

## 2013-05-30 MED ORDER — PROMETHAZINE HCL 25 MG PO TABS: 25.0000 mg | ORAL_TABLET | Freq: Four times a day (QID) | ORAL | Status: DC | PRN

## 2013-05-30 MED ORDER — PROPOFOL 10 MG/ML IV BOLUS
INTRAVENOUS | Status: DC | PRN
  Administered 2013-05-30: 200 mg via INTRAVENOUS

## 2013-05-30 MED ORDER — PROMETHAZINE HCL 25 MG/ML IJ SOLN
6.2500 mg | INTRAMUSCULAR | Status: DC | PRN
  Filled 2013-05-30: qty 1

## 2013-05-30 MED ORDER — HYDROMORPHONE HCL PF 1 MG/ML IJ SOLN
0.5000 mg | INTRAMUSCULAR | Status: AC | PRN
  Administered 2013-05-30 (×4): 0.5 mg via INTRAVENOUS
  Filled 2013-05-30: qty 1

## 2013-05-30 MED ORDER — PHENAZOPYRIDINE HCL 100 MG PO TABS
ORAL_TABLET | ORAL | Status: AC
  Filled 2013-05-30: qty 2

## 2013-05-30 SURGICAL SUPPLY — 47 items
BAG DRAIN URO-CYSTO SKYTR STRL (DRAIN) ×3 IMPLANT
BAG DRN UROCATH (DRAIN) ×1
BASKET LASER NITINOL 1.9FR (BASKET) IMPLANT
BASKET STNLS GEMINI 4WIRE 3FR (BASKET) IMPLANT
BASKET STONE NCOMPASS (UROLOGICAL SUPPLIES) ×3 IMPLANT
BASKET ZERO TIP NITINOL 2.4FR (BASKET) IMPLANT
BSKT STON RTRVL 120 1.9FR (BASKET)
BSKT STON RTRVL GEM 120X11 3FR (BASKET)
BSKT STON RTRVL ZERO TP 2.4FR (BASKET)
CANISTER SUCT LVC 12 LTR MEDI- (MISCELLANEOUS) ×3 IMPLANT
CATH CLEAR GEL 3F BACKSTOP (CATHETERS) ×3 IMPLANT
CATH URET 5FR 28IN OPEN ENDED (CATHETERS) ×3 IMPLANT
CATH URET DUAL LUMEN 6-10FR 50 (CATHETERS) ×3 IMPLANT
CLOTH BEACON ORANGE TIMEOUT ST (SAFETY) ×3 IMPLANT
DRAPE CAMERA CLOSED 9X96 (DRAPES) ×3 IMPLANT
EXTRACTOR STONE NITINOL NGAGE (UROLOGICAL SUPPLIES) ×3 IMPLANT
FIBER LASER FLEXIVA 200 (UROLOGICAL SUPPLIES) ×3 IMPLANT
FIBER LASER FLEXIVA 365 (UROLOGICAL SUPPLIES) IMPLANT
GLOVE BIO SURGEON STRL SZ7.5 (GLOVE) ×3 IMPLANT
GLOVE BIOGEL M STER SZ 6 (GLOVE) ×3 IMPLANT
GLOVE BIOGEL PI IND STRL 6.5 (GLOVE) ×1 IMPLANT
GLOVE BIOGEL PI INDICATOR 6.5 (GLOVE) ×2
GLOVE INDICATOR 7.5 STRL GRN (GLOVE) ×3 IMPLANT
GLOVE OPTIFIT SS 7.0 STRL BRWN (GLOVE) ×1
GLOVE OPTIFIT SS 7.5 STRL LX (GLOVE) ×2 IMPLANT
GLOVE SURG SS PI 7.5 STRL IVOR (GLOVE) ×3 IMPLANT
GOWN STRL REIN XL XLG (GOWN DISPOSABLE) IMPLANT
GOWN STRL REUS W/ TWL XL LVL3 (GOWN DISPOSABLE) ×1 IMPLANT
GOWN STRL REUS W/TWL XL LVL3 (GOWN DISPOSABLE) ×6 IMPLANT
GUIDEWIRE 0.038 PTFE COATED (WIRE) IMPLANT
GUIDEWIRE ANG ZIPWIRE 038X150 (WIRE) IMPLANT
GUIDEWIRE STR DUAL SENSOR (WIRE) ×6 IMPLANT
IV NS 1000ML (IV SOLUTION) ×3
IV NS 1000ML BAXH (IV SOLUTION) ×1 IMPLANT
IV NS IRRIG 3000ML ARTHROMATIC (IV SOLUTION) ×3 IMPLANT
KIT BALLIN UROMAX 15FX10 (LABEL) IMPLANT
KIT BALLN UROMAX 15FX4 (MISCELLANEOUS) IMPLANT
KIT BALLN UROMAX 26 75X4 (MISCELLANEOUS)
NS IRRIG 500ML POUR BTL (IV SOLUTION) IMPLANT
PACK CYSTOSCOPY (CUSTOM PROCEDURE TRAY) ×3 IMPLANT
SET HIGH PRES BAL DIL (LABEL)
SHEATH ACCESS URETERAL 24CM (SHEATH) ×3 IMPLANT
SHEATH ACCESS URETERAL 38CM (SHEATH) IMPLANT
SHEATH ACCESS URETERAL 54CM (SHEATH) IMPLANT
STENT 6X26 (STENTS) IMPLANT
STENT URET 6FRX26 CONTOUR (STENTS) ×3 IMPLANT
TUBE FEEDING 8FR 16IN STR KANG (MISCELLANEOUS) IMPLANT

## 2013-05-30 NOTE — Discharge Instructions (Signed)
DISCHARGE INSTRUCTIONS FOR KIDNEY STONES OR URETERAL STENT   MEDICATIONS:  1. DO NOT RESUME YOUR ASPIRIN, or any other medicines like ibuprofen, motrin, excedrin, advil, aleve, vitamin E, fish oil as these can all cause bleeding x 7 days.  2. Resume all your other meds from home - except do not take any other pain meds that you may have at home.  3. Take Cipro one hour prior to removal of your stent.  4. Trospium is to prevent bladder spasms and help reduce urinary frequency. 5. Pyridium is to help with the burning/stinging when you urinate. 6. Oxycodone is for moderate/severe pain, otherwise taking upto 1000mg  every 6 hours of plainTylenol will help treat your pain.    ACTIVITY:  1. No strenuous activity x 1week  2. No driving while on narcotic pain medications  3. Drink plenty of water  4. Continue to walk at home - you can still get blood clots when you are at home, so keep active, but don't over do it.  5. May return to work/school tomorrow or when you feel ready   BATHING:  1. You can shower and we recommend daily showers    SIGNS/SYMPTOMS TO CALL:  Please call us if you have a fever greater than 101.5, uncontrolled nausea/vomiting, uncontrolled pain, dizziness, unable to urinate, bloody urine, chest pain, shortness of breath, leg swelling, leg pain, redness around wound, drainage from wound, or any other concerns or questions.   You can reach us at [phone].   FOLLOW-UP:  1. You have an appointment in 2 weeks for stent removal.   Post Anesthesia Home Care Instructions  Activity: Get plenty of rest for the remainder of the day. A responsible adult should stay with you for 24 hours following the procedure.  For the next 24 hours, DO NOT: -Drive a car -Advertising copywriterperate machinery -Drink alcoholic beverages -Take any medication unless instructed by your physician -Make any legal decisions or sign important papers.  Meals: Start with liquid foods such as gelatin or soup. Progress  to regular foods as tolerated. Avoid greasy, spicy, heavy foods. If nausea and/or vomiting occur, drink only clear liquids until the nausea and/or vomiting subsides. Call your physician if vomiting continues.  Special Instructions/Symptoms: Your throat may feel dry or sore from the anesthesia or the breathing tube placed in your throat during surgery. If this causes discomfort, gargle with warm salt water. The discomfort should disappear within 24 hours.

## 2013-05-30 NOTE — Anesthesia Preprocedure Evaluation (Signed)
Anesthesia Evaluation  Patient identified by MRN, date of birth, ID band Patient awake    Reviewed: Allergy & Precautions, H&P , NPO status , Patient's Chart, lab work & pertinent test results  Airway Mallampati: III TM Distance: <3 FB Neck ROM: Full    Dental no notable dental hx.    Pulmonary Current Smoker,  breath sounds clear to auscultation  Pulmonary exam normal       Cardiovascular hypertension, Rhythm:Regular Rate:Normal  Uncontrolled HTN   Neuro/Psych negative neurological ROS  negative psych ROS   GI/Hepatic negative GI ROS, Neg liver ROS,   Endo/Other  diabetesMorbid obesity  Renal/GU negative Renal ROS  negative genitourinary   Musculoskeletal negative musculoskeletal ROS (+)   Abdominal   Peds negative pediatric ROS (+)  Hematology negative hematology ROS (+)   Anesthesia Other Findings   Reproductive/Obstetrics negative OB ROS                           Anesthesia Physical  Anesthesia Plan  ASA: III  Anesthesia Plan: General   Post-op Pain Management:    Induction: Intravenous  Airway Management Planned: LMA  Additional Equipment:   Intra-op Plan:   Post-operative Plan: Extubation in OR  Informed Consent: I have reviewed the patients History and Physical, chart, labs and discussed the procedure including the risks, benefits and alternatives for the proposed anesthesia with the patient or authorized representative who has indicated his/her understanding and acceptance.   Dental advisory given  Plan Discussed with: CRNA and Surgeon  Anesthesia Plan Comments: (Previous Grade 4 view with Mac blade, Grade 1 with Glidescope)        Anesthesia Quick Evaluation

## 2013-05-30 NOTE — Anesthesia Postprocedure Evaluation (Signed)
  Anesthesia Post-op Note  Patient: Michael Carroll  Procedure(s) Performed: Procedure(s) (LRB): CYSTOSCOPY WITH RIGHT RETROGRADE PYELOGRAM, URETEROSCOPY LASER LITHOTRIPSY AND STENT EXCHANGE  (Right) HOLMIUM LASER APPLICATION (Right)  Patient Location: PACU  Anesthesia Type: General  Level of Consciousness: awake and alert   Airway and Oxygen Therapy: Patient Spontanous Breathing  Post-op Pain: mild  Post-op Assessment: Post-op Vital signs reviewed, Patient's Cardiovascular Status Stable, Respiratory Function Stable, Patent Airway and No signs of Nausea or vomiting  Last Vitals:  Filed Vitals:   05/30/13 1615  BP: 153/90  Pulse: 88  Temp: 36.5 C  Resp: 17    Post-op Vital Signs: stable   Complications: No apparent anesthesia complications

## 2013-05-30 NOTE — Interval H&P Note (Signed)
History and Physical Interval Note: No changes to the above, patient presents for removal of his stones in the right ureter and stent exchange.  I have discussed this with the patient in detail, okay to proceed. 05/30/2013 10:31 AM  Michael GraffErnest P Pree  has presented today for surgery, with the diagnosis of RIGHT URETERAL CALCULUS  The various methods of treatment have been discussed with the patient and family. After consideration of risks, benefits and other options for treatment, the patient has consented to  Procedure(s): CYSTOSCOPY WITH RIGHT RETROGRADE PYELOGRAM, URETEROSCOPY LASER LITHOTRIPSY AND STENT EXCHANGE  (Right) HOLMIUM LASER APPLICATION (Right) as a surgical intervention .  The patient's history has been reviewed, patient examined, no change in status, stable for surgery.  I have reviewed the patient's chart and labs.  Questions were answered to the patient's satisfaction.     Crist FatBenjamin W Herrick

## 2013-05-30 NOTE — H&P (View-Only) (Signed)
I have been asked to see the patient by Dr. Margarita Grizzleanielle ray, M.D., for evaluation and management of right obstructing distal ureteral stones.  History of present illness: Is a 53 year old male with a long history of nephrolithiasis who presented to the [facility] with 3 days of acute onset right-sided flank/lower abdominal pain. The patient has had intermittent pain for the past 3 months, he has passed several stones that time. The patient tells me that he has had 10 ureteral stents in the past and had multiple shockwave lithotripsy and ureteroscopy is [facility]. The patient currently is in 10/10 pain. The pain is predominantly in his right flank and right lower quadrant. It radiates down to his groin. He denies any fevers/chills. He denies any dysuria, urgency, or frequency. He has noted a small amount of gross hematuria.  The patient is otherwise relatively healthy, he describes hypertension and diet-controlled diabetes.  Review of systems: A 12 point comprehensive review of systems was obtained and is negative unless otherwise stated in the history of present illness.  Patient Active Problem List   Diagnosis Date Noted  . Chronic cholecystitis with calculus 03/26/2012    No current facility-administered medications on file prior to encounter.   Current Outpatient Prescriptions on File Prior to Encounter  Medication Sig Dispense Refill  . aspirin 81 MG tablet Take 81 mg by mouth every evening.         Past Medical History  Diagnosis Date  . Hypertension   . Diabetes mellitus   . Headache(784.0)     migraine history  . Cirrhosis of liver without mention of alcohol   . Kidney stones     Past Surgical History  Procedure Laterality Date  . Stents      kidney stones  . Cholecystectomy N/A 03/26/2012    Procedure: LAPAROSCOPIC CHOLECYSTECTOMY;  Surgeon: Romie LeveeAlicia Thomas, MD;  Location: [facility];  Service: General;  Laterality: N/A;  . Liver biopsy N/A  03/26/2012    Procedure: LIVER BIOPSY;  Surgeon: Romie LeveeAlicia Thomas, MD;  Location: [facility];  Service: General;  Laterality: N/A;    History  Substance Use Topics  . Smoking status: Current Every Day Smoker -- 1.50 packs/day    Types: Cigarettes  . Smokeless tobacco: Not on file  . Alcohol Use: No    Family History  Problem Relation Age of Onset  . Cancer - Lung Mother   . Cancer Mother   . Seizures Sister   . Heart disease Father     PE: Filed Vitals:   05/15/13 1702  BP: 178/75  Pulse: 56  Temp: 98.2 F (36.8 C)  Resp: 20    Patient appears to be in no acute distress  patient is alert and oriented x3 Atraumatic normocephalic head No cervical or supraclavicular lymphadenopathy appreciated No increased work of breathing, no audible wheezes/rhonchi Regular sinus rhythm/rate Abdomen is soft, nontender, nondistended, no CVA or suprapubic tenderness Lower string is are symmetric without appreciable edema Grossly neurologically intact No identifiable skin lesions   Recent Labs  05/15/13 1000  WBC 6.5  HGB 18.0*  HCT 51.9    Recent Labs  05/15/13 1000  NA 142  K 4.5  CL 102  CO2 29  GLUCOSE 204*  BUN 20  CREATININE 0.90  CALCIUM 10.2   No results found for this basename: LABPT, INR,  in the last 72 hours No results found for this basename: LABURIN,  in the last 72 hours Results for  orders placed during the hospital encounter of 04/17/13  STONE ANALYSIS     Status: None   Collection Time    04/17/13 11:05 PM      Result Value Ref Range Status   Nidus Not observed   Final   Component 1 KSTONE See Below   Final   Comment: (NOTE)     Calcium Oxalate Dihydrate ([place]) 95%     Calcium Oxalate Monohydrate ([place]) 5%   Stone Weight KSTONE 0.0900   Final   Comment: Performed at [facility]    Imaging: CT abdomen/pelvis, noncontrast stone protocol: I've independently reviewed these images. They show a severely dilated right upper urinary  collecting system with 2 obstructing stones in the mid ureter.  Imp: Right obstructing ureteral stones with uncontrolled pain and associated hydronephrosis.  Recommendations: Aware of the risk and benefits of urgent ureteroscopy/laser lithotripsy/stent placement. I cautioned the patient that stones may be impacted and difficult to extract, at which point we will leave the stent and come back in 2 weeks. The patient has been through this procedure before as well where the risk and benefits, I reiterated them.   Crist FatBenjamin W Herrick

## 2013-05-30 NOTE — Op Note (Signed)
Preoperative diagnosis:  1. Right ureteral stones   Postoperative diagnosis:  1. same   Procedure: 1. Cystoscopy,  2. right retrograde pyelogram with interpretation,  3. right ureteroscopy with laser lithotripsy and stone removal,  4. right ureteral stent placement.  Surgeon: Crist FatBenjamin W. Alie Hardgrove, MD  Anesthesia: General  Complications: None  Intraoperative findings: Retrograde pyelogram was performed the right revealing a filling defect in the mid ureter consistent with the patient's known stone. There was proximal dilation of the ureter and renal pelvis. There was a false passage in the mid ureter likely from the previous stent that was transmural. There was significant ureteral edema and as such the stent was left in place without a string was placed removing 2 weeks.  EBL: Minimal  Specimens: None  Indication: Michael Carroll is a 53 y.o. patient with long history of nephrolithiasis. He presented to the emergency department 2 weeks prior with renal colic. We proceeded to the operating room for stent placement, he presents today for followup stone removal.  After reviewing the management options for treatment, he elected to proceed with the above surgical procedure(s). We have discussed the potential benefits and risks of the procedure, side effects of the proposed treatment, the likelihood of the patient achieving the goals of the procedure, and any potential problems that might occur during the procedure or recuperation. Informed consent has been obtained.  Description of procedure:  The patient was taken to the operating room and general anesthesia was induced.  The patient was placed in the dorsal lithotomy position, prepped and draped in the usual sterile fashion, and preoperative antibiotics were administered. A preoperative time-out was performed.   The 30 22 French rigid cystoscope was then gently passed to the patient's urethra and into the bladder. Cystoscopic evaluation  was performed with no mucosal lesions, stones, or suspicious areas within the bladder. There was significant periureteral edema. The stent emanating from the patient's right ureteral orifice was then grasped at the tip and brought down to the urethral meatus with a stent grasper. A 0.38 sensor wire was then gently passed to the patient's stent and into the right renal pelvis. I then attempted to advance up the right ureter unsuccessfully using a semirigid ureteroscope but I was unable to get into the right U0 because of the edema and obscured vision. As such I passed a dual-lumen catheter over the safety wire passed a second 0.38 sensor wire into the right renal pelvis. I then passed the rigid ureteroscope over the second wire and into the distal ureter. The wire was then removed. I then attempted to navigate the distal and mid right ureter with a rigid scope but could not get over the pelvic inlet. I then passed a 0.38 sensor wire again through the rigid scope and remove the scope. I then passed the flexible ureteroscope over the wire and then removed the wire once I was in the distal right ureter. Finally I did encounter a stone that was heavily impacted in the mid ureter at the pelvic inlet. I then navigated further up the ureter and noted a false passage from a transmural placement of the stent. I was able to pull back the safety wire and advanced it into the true lumen and back up into the renal pelvis. Just proximal to this there was a second stone. I then used the backstop gel to occlude the ureter proximal to the second stone. Using a 200  fiber with settings of 0.6 and 6 I was able  to fragment the proximal stone into multiple small pieces. I then pulled back to the distal stone and repeated the fragmentation process. I did ultimately were increased to laser settings to 8 Hz and 1 J. I then placed a wire up through the flexible ureteroscope and into the right renal pelvis. I then placed a ureteral access  sheath into the distal ureter. I then using the flexible scope removed the stone fragments with a N-gauge stone basket. I then advanced the scope into the right renal pelvis and aspirated the pelvis noting significant amount of clot and sediment. I then injected contrast and performed complete pyeloscopy using fluoroscopic guidance. There were no additional stones in the renal pelvis. I then came back to the ureter and made several passes with the encompass basket to ensure that all stone fragments had been removed. I then removed the flexible ureteroscope entirely. I then backloaded the safety wire through the cystoscope and inserted the cystoscope gently into the patient's bladder. I then passed a 6 [place] by 26 cm double-J ureteral stent without a stent tether into the right renal pelvis using fluoroscopic guidance. Stent was noted to be well positioned in the upper pole. The wire was then removed and a small curl was noted in the patient's bladder. The bladder was subsequently emptied and the scope was removed. Lidocaine jelly was then placed into the patient's urethra. At the beginning of the case a BNO suppository was placed in the patient's rectum.  The patient tolerated the procedure well. He was taken to the recovery room in stable condition. All counts were correct at the end of the case.  Specimen: Stone fragments will be taken to [facility] specialist for stone analysis.  Disposition: The patient is discharged home. You be scheduled for stent removal in 2 weeks.  Crist FatBenjamin W. Dreon Pineda, M.D.

## 2013-05-30 NOTE — Transfer of Care (Signed)
Immediate Anesthesia Transfer of Care Note  Patient: Michael Carroll  Procedure(s) Performed: Procedure(s) (LRB): CYSTOSCOPY WITH RIGHT RETROGRADE PYELOGRAM, URETEROSCOPY LASER LITHOTRIPSY AND STENT EXCHANGE  (Right) HOLMIUM LASER APPLICATION (Right)  Patient Location: PACU  Anesthesia Type: General  Level of Consciousness: awake, alert  and oriented  Airway & Oxygen Therapy: Patient Spontanous Breathing and Patient connected to face mask oxygen  Post-op Assessment: Report given to PACU RN and Post -op Vital signs reviewed and stable  Post vital signs: Reviewed and stable  Complications: No apparent anesthesia complications

## 2013-05-30 NOTE — Anesthesia Procedure Notes (Signed)
Procedure Name: LMA Insertion Date/Time: 05/30/2013 12:27 PM Performed by: Norva PavlovALLAWAY, Brittnei Jagiello G Pre-anesthesia Checklist: Patient identified, Emergency Drugs available, Suction available and Patient being monitored Patient Re-evaluated:Patient Re-evaluated prior to inductionOxygen Delivery Method: Circle System Utilized Preoxygenation: Pre-oxygenation with 100% oxygen Intubation Type: IV induction Ventilation: Mask ventilation without difficulty LMA: LMA inserted LMA Size: 5.0 Number of attempts: 1 Airway Equipment and Method: bite block Placement Confirmation: positive ETCO2 Tube secured with: Tape Dental Injury: Teeth and Oropharynx as per pre-operative assessment

## 2013-05-31 ENCOUNTER — Encounter (HOSPITAL_BASED_OUTPATIENT_CLINIC_OR_DEPARTMENT_OTHER): Payer: Self-pay | Admitting: Urology

## 2013-05-31 LAB — URINE CULTURE

## 2013-05-31 NOTE — ED Provider Notes (Signed)
Medical screening examination/treatment/procedure(s) were performed by non-physician practitioner and as supervising physician I was immediately available for consultation/collaboration.   EKG Interpretation None        Charles B. Sheldon, MD 05/31/13 2047 

## 2013-06-04 LAB — GLUCOSE, CAPILLARY: Glucose-Capillary: 138 mg/dL — ABNORMAL HIGH (ref 70–99)

## 2013-07-03 ENCOUNTER — Other Ambulatory Visit: Payer: Self-pay | Admitting: Urology

## 2013-07-05 ENCOUNTER — Other Ambulatory Visit: Payer: Self-pay | Admitting: Urology

## 2013-07-10 ENCOUNTER — Encounter (HOSPITAL_COMMUNITY): Payer: Self-pay | Admitting: *Deleted

## 2013-07-11 ENCOUNTER — Encounter (HOSPITAL_COMMUNITY)
Admission: RE | Disposition: A | Discharge status: Home / Self Care | Payer: Self-pay | Source: Ambulatory Visit | Attending: Urology

## 2013-07-11 ENCOUNTER — Ambulatory Visit (HOSPITAL_COMMUNITY): Payer: Medicaid Other | Admitting: Anesthesiology

## 2013-07-11 ENCOUNTER — Encounter (HOSPITAL_COMMUNITY): Payer: Self-pay | Admitting: *Deleted

## 2013-07-11 ENCOUNTER — Encounter (HOSPITAL_COMMUNITY): Payer: Self-pay | Admitting: Anesthesiology

## 2013-07-11 ENCOUNTER — Ambulatory Visit (HOSPITAL_COMMUNITY)
Admission: RE | Admit: 2013-07-11 | Discharge: 2013-07-11 | Disposition: A | Discharge status: Home / Self Care | Payer: Self-pay | Source: Ambulatory Visit | Attending: Urology | Admitting: Urology

## 2013-07-11 DIAGNOSIS — E119 Type 2 diabetes mellitus without complications: Secondary | ICD-10-CM | POA: Insufficient documentation

## 2013-07-11 DIAGNOSIS — I10 Essential (primary) hypertension: Secondary | ICD-10-CM | POA: Insufficient documentation

## 2013-07-11 DIAGNOSIS — Z79899 Other long term (current) drug therapy: Secondary | ICD-10-CM | POA: Insufficient documentation

## 2013-07-11 DIAGNOSIS — Z888 Allergy status to other drugs, medicaments and biological substances status: Secondary | ICD-10-CM | POA: Insufficient documentation

## 2013-07-11 DIAGNOSIS — Z466 Encounter for fitting and adjustment of urinary device: Secondary | ICD-10-CM | POA: Insufficient documentation

## 2013-07-11 DIAGNOSIS — F17200 Nicotine dependence, unspecified, uncomplicated: Secondary | ICD-10-CM | POA: Insufficient documentation

## 2013-07-11 DIAGNOSIS — K8010 Calculus of gallbladder with chronic cholecystitis without obstruction: Secondary | ICD-10-CM

## 2013-07-11 DIAGNOSIS — N200 Calculus of kidney: Secondary | ICD-10-CM

## 2013-07-11 HISTORY — PX: CYSTOSCOPY: SHX5120

## 2013-07-11 LAB — SURGICAL PCR SCREEN
MRSA, PCR: NEGATIVE
Staphylococcus aureus: NEGATIVE

## 2013-07-11 LAB — GLUCOSE, CAPILLARY
GLUCOSE-CAPILLARY: 125 mg/dL — AB (ref 70–99)
Glucose-Capillary: 171 mg/dL — ABNORMAL HIGH (ref 70–99)

## 2013-07-11 LAB — CBC
HEMATOCRIT: 46.4 % (ref 39.0–52.0)
HEMOGLOBIN: 16.1 g/dL (ref 13.0–17.0)
MCH: 29.7 pg (ref 26.0–34.0)
MCHC: 34.7 g/dL (ref 30.0–36.0)
MCV: 85.5 fL (ref 78.0–100.0)
Platelets: 115 10*3/uL — ABNORMAL LOW (ref 150–400)
RBC: 5.43 MIL/uL (ref 4.22–5.81)
RDW: 12.8 % (ref 11.5–15.5)
WBC: 7.6 10*3/uL (ref 4.0–10.5)

## 2013-07-11 LAB — BASIC METABOLIC PANEL
BUN: 18 mg/dL (ref 6–23)
CHLORIDE: 98 meq/L (ref 96–112)
CO2: 24 mEq/L (ref 19–32)
CREATININE: 0.82 mg/dL (ref 0.50–1.35)
Calcium: 9.5 mg/dL (ref 8.4–10.5)
GFR calc non Af Amer: >90 mL/min (ref >90)
Glucose, Bld: 181 mg/dL — ABNORMAL HIGH (ref 70–99)
POTASSIUM: 3.6 meq/L — AB (ref 3.7–5.3)
SODIUM: 137 meq/L (ref 137–147)

## 2013-07-11 SURGERY — CYSTOSCOPY
Anesthesia: General | Laterality: Right

## 2013-07-11 MED ORDER — MIDAZOLAM HCL 2 MG/2ML IJ SOLN
INTRAMUSCULAR | Status: AC
  Filled 2013-07-11: qty 2

## 2013-07-11 MED ORDER — CIPROFLOXACIN IN D5W 400 MG/200ML IV SOLN
INTRAVENOUS | Status: AC
  Filled 2013-07-11: qty 200

## 2013-07-11 MED ORDER — GENTAMICIN SULFATE 40 MG/ML IJ SOLN
240.0000 mg | INTRAVENOUS | Status: AC
  Administered 2013-07-11: 240 mg via INTRAVENOUS
  Filled 2013-07-11: qty 6

## 2013-07-11 MED ORDER — MUPIROCIN 2 % EX OINT
TOPICAL_OINTMENT | Freq: Two times a day (BID) | CUTANEOUS | Status: DC
  Administered 2013-07-11: 1 via NASAL

## 2013-07-11 MED ORDER — LIDOCAINE HCL 2 % EX GEL
CUTANEOUS | Status: DC | PRN
  Administered 2013-07-11: 1 via URETHRAL

## 2013-07-11 MED ORDER — FENTANYL CITRATE 0.05 MG/ML IJ SOLN: 25.0000 ug | INTRAMUSCULAR | Status: DC | PRN

## 2013-07-11 MED ORDER — LIDOCAINE HCL 2 % EX GEL
CUTANEOUS | Status: AC
  Filled 2013-07-11: qty 10

## 2013-07-11 MED ORDER — FENTANYL CITRATE 0.05 MG/ML IJ SOLN
INTRAMUSCULAR | Status: DC | PRN
  Administered 2013-07-11: 50 ug via INTRAVENOUS

## 2013-07-11 MED ORDER — PROPOFOL 10 MG/ML IV BOLUS
INTRAVENOUS | Status: AC
  Filled 2013-07-11: qty 20

## 2013-07-11 MED ORDER — STERILE WATER FOR IRRIGATION IR SOLN
Status: DC | PRN
  Administered 2013-07-11: 3000 mL

## 2013-07-11 MED ORDER — FENTANYL CITRATE 0.05 MG/ML IJ SOLN
INTRAMUSCULAR | Status: AC
  Filled 2013-07-11: qty 2

## 2013-07-11 MED ORDER — MIDAZOLAM HCL 5 MG/5ML IJ SOLN
INTRAMUSCULAR | Status: DC | PRN
  Administered 2013-07-11: 2 mg via INTRAVENOUS

## 2013-07-11 MED ORDER — LACTATED RINGERS IV SOLN
INTRAVENOUS | Status: DC | PRN
  Administered 2013-07-11: 07:00:00 via INTRAVENOUS

## 2013-07-11 MED ORDER — CIPROFLOXACIN IN D5W 400 MG/200ML IV SOLN
400.0000 mg | INTRAVENOUS | Status: AC
  Administered 2013-07-11: 400 mg via INTRAVENOUS

## 2013-07-11 MED ORDER — PROPOFOL 10 MG/ML IV BOLUS
INTRAVENOUS | Status: DC | PRN
  Administered 2013-07-11: 200 mg via INTRAVENOUS

## 2013-07-11 MED ORDER — LIDOCAINE HCL (CARDIAC) 20 MG/ML IV SOLN
INTRAVENOUS | Status: DC | PRN
  Administered 2013-07-11: 100 mg via INTRAVENOUS

## 2013-07-11 MED ORDER — MUPIROCIN 2 % EX OINT
TOPICAL_OINTMENT | CUTANEOUS | Status: AC
  Administered 2013-07-11: 1 via NASAL
  Filled 2013-07-11: qty 22

## 2013-07-11 MED ORDER — KETOROLAC TROMETHAMINE 30 MG/ML IJ SOLN
30.0000 mg | Freq: Once | INTRAMUSCULAR | Status: AC
Start: 2013-07-11 — End: 2013-07-11
  Administered 2013-07-11: 30 mg via INTRAVENOUS
  Filled 2013-07-11: qty 1

## 2013-07-11 MED ORDER — LIDOCAINE HCL (CARDIAC) 20 MG/ML IV SOLN
INTRAVENOUS | Status: AC
  Filled 2013-07-11: qty 5

## 2013-07-11 SURGICAL SUPPLY — 16 items
BAG URO CATCHER STRL LF (DRAPE) ×4 IMPLANT
BASKET LASER NITINOL 1.9FR (BASKET) IMPLANT
BASKET ZERO TIP NITINOL 2.4FR (BASKET) IMPLANT
BSKT STON RTRVL 120 1.9FR (BASKET)
BSKT STON RTRVL ZERO TP 2.4FR (BASKET)
CATH URET 5FR 28IN OPEN ENDED (CATHETERS) IMPLANT
CLOTH BEACON ORANGE TIMEOUT ST (SAFETY) ×4 IMPLANT
DRAPE CAMERA CLOSED 9X96 (DRAPES) ×4 IMPLANT
GLOVE BIOGEL M STRL SZ7.5 (GLOVE) ×4 IMPLANT
GOWN STRL REUS W/TWL XL LVL3 (GOWN DISPOSABLE) ×4 IMPLANT
GUIDEWIRE ANG ZIPWIRE 038X150 (WIRE) IMPLANT
GUIDEWIRE STR DUAL SENSOR (WIRE) IMPLANT
PACK CYSTO (CUSTOM PROCEDURE TRAY) ×4 IMPLANT
TUBE FEEDING 8FR 16IN STR KANG (MISCELLANEOUS) IMPLANT
TUBING CONNECTING 10 (TUBING) ×3 IMPLANT
TUBING CONNECTING 10' (TUBING) ×1

## 2013-07-11 NOTE — Transfer of Care (Signed)
Immediate Anesthesia Transfer of Care Note  Patient: Michael Carroll  Procedure(s) Performed: Procedure(s): CYSTOSCOPY RIGHT STENT REMOVAL  Patient Location: PACU  Anesthesia Type:General  Level of Consciousness: sedated  Airway & Oxygen Therapy: Patient Spontanous Breathing and Patient connected to face mask oxygen  Post-op Assessment: Report given to PACU RN and Post -op Vital signs reviewed and stable  Post vital signs: Reviewed and stable  Complications: No apparent anesthesia complications

## 2013-07-11 NOTE — Interval H&P Note (Signed)
History and Physical Interval Note: Patient no showed for his stent removal, it has been in for 6 weeks and likely is encrusted given his history.  We will try and remove it in the OR.  07/11/2013 6:03 AM  Michael Carroll  has presented today for surgery, with the diagnosis of URETERAL RIGHT STENT  The various methods of treatment have been discussed with the patient and family. After consideration of risks, benefits and other options for treatment, the patient has consented to  Procedure(s): RIGHT URETERAL STENT REMOVAL (Right) as a surgical intervention .  The patient's history has been reviewed, patient examined, no change in status, stable for surgery.  I have reviewed the patient's chart and labs.  Questions were answered to the patient's satisfaction.     Berniece SalinesHERRICK, BENJAMIN W

## 2013-07-11 NOTE — H&P (Signed)
Reason For Visit Removal of right ureteral stent   History of Present Illness 53 year old male with long history of nephrolithiasis who presented to the emergency department with right mid ureteral stones and associated hydronephrosis and poorly controlled pain. He underwent urgent stent placement at night followed by ureteroscopy 2 weeks afterwards. The patient tolerated his stent poorly, had significant amount of bladder spasms and bladder pain. For his pain he was requiring a large amount of necrotic. He presents today for stent removal.   Interval: Since the ureteroscopy the patient's stent associated symptoms have been significantly better. He has still been requiring large amounts of pain medication however he has not missed much work.   Past Medical History Problems  1. History of kidney stones (V13.01)  Surgical History Problems  1. History of Cystoscopy With Insertion Of Ureteral Stent Right 2. History of Cystoscopy With Ureteroscopy Right  Current Meds 1. No Reported Medications Recorded 2. OxyCODONE HCl - 5 MG Oral Capsule; TAKE 1 TO 2 CAPSULES EVERY 4 HOURS AS  NEEDED FOR BREAKTHROUGH PAIN;  Therapy: 19May2015 to (Evaluate:25May2015); Last Rx:20May2015 Ordered 3. Promethazine HCl - 25 MG Oral Tablet; May take 0.5 to 1 tablet Q 6hrs nausea/vomiting;  Therapy: 01May2015 to (Last Rx:01May2015)  Requested for: 01May2015 Ordered 4. Trospium Chloride ER 60 MG Oral Capsule Extended Release 24 Hour; TAKE ONE  CAPSULE BY MOUTH DAILY;  Therapy: 21May2015 to (Evaluate:31May2015)  Requested for: 21May2015; Last  Rx:21May2015 Ordered  Allergies Medication  1. Compazine TABS 2. Reglan TABS 3. Zofran TABS  Family History Problems  1. No pertinent family history : Mother  Social History Problems  1. Denied: History of Alcohol use 2. Caffeine use (V49.89) 3. Current every day smoker (305.1) 4. Married  Vitals Vital Signs [Data Includes: Last 1 Day]  Recorded: 02Jun2015  01:31PM  Height: 6 ft  Weight: 270 lb  BMI Calculated: 36.62 BSA Calculated: 2.42 Blood Pressure: 175 / 113 Temperature: 98.2 F Heart Rate: 82  Results/Data Urine [Data Includes: Last 1 Day]   02Jun2015  COLOR AMBER   APPEARANCE CLOUDY   SPECIFIC GRAVITY 1.025   pH 6.0   GLUCOSE NEG mg/dL  BILIRUBIN SMALL   KETONE NEG mg/dL  BLOOD LARGE   PROTEIN 100 mg/dL  UROBILINOGEN > 8 mg/dL  NITRITE NEG   LEUKOCYTE ESTERASE SMALL   SQUAMOUS EPITHELIAL/HPF RARE   WBC 7-10 WBC/hpf  RBC TNTC RBC/hpf  BACTERIA FEW   CRYSTALS NONE SEEN   CASTS NONE SEEN    Assessment Patient with a right ureteral stent status post right ureteroscopy performed approximately 3 weeks ago.   Plan Health Maintenance  1. UA With REFLEX; [Do Not Release]; Status:Complete;   Done: 02Jun2015 01:12PM Hydronephrosis, right  2. Start: Diazepam 10 MG Oral Tablet; TAKE 2 TABLET Once 1-2 tablets one hour prior to  the procedure 3. Start: Oxycodone-Acetaminophen 10-325 MG Oral Tablet; TAKE 1 TABLET EVERY 6  HOURS AS NEEDED FOR PAIN 4. Follow-up Office  Follow-up for stent removal under oral sedation  Status: Hold For -  Appointment,Date of Service  Requested for: 05Jun2015  Discussion/Summary Patient has refused to undergo stent removal without any sort of sedation or pain medication. As such we have rescheduled him for 3 days from now and I prescribed him oxycodone and Valium. The meantime I will send a urine culture to ensure the patient does not have an infection.

## 2013-07-11 NOTE — Anesthesia Postprocedure Evaluation (Signed)
  Anesthesia Post-op Note  Patient: Michael Carroll  Procedure(s) Performed: Procedure(s): CYSTOSCOPY RIGHT STENT REMOVAL  Patient Location: PACU  Anesthesia Type: General  Level of Consciousness: awake and alert   Airway and Oxygen Therapy: Patient Spontanous Breathing  Post-op Pain: mild  Post-op Assessment: Post-op Vital signs reviewed, Patient's Cardiovascular Status Stable, Respiratory Function Stable, Patent Airway and No signs of Nausea or vomiting  Last Vitals:  Filed Vitals:   07/11/13 1000  BP: 140/81  Pulse: 58  Temp: 36.4 C  Resp: 16    Post-op Vital Signs: stable   Complications: No apparent anesthesia complications

## 2013-07-11 NOTE — Anesthesia Preprocedure Evaluation (Addendum)
Anesthesia Evaluation  Patient identified by MRN, date of birth, ID band Patient awake    Reviewed: Allergy & Precautions, H&P , NPO status , Patient's Chart, lab work & pertinent test results  Airway Mallampati: II TM Distance: >3 FB Neck ROM: Full    Dental no notable dental hx.    Pulmonary neg pulmonary ROS, Current Smoker,  breath sounds clear to auscultation  Pulmonary exam normal       Cardiovascular hypertension, negative cardio ROS  Rhythm:Regular Rate:Normal     Neuro/Psych  Headaches, negative neurological ROS  negative psych ROS   GI/Hepatic negative GI ROS, Neg liver ROS,   Endo/Other  negative endocrine ROSdiabetes, Type 2  Renal/GU negative Renal ROS  negative genitourinary   Musculoskeletal negative musculoskeletal ROS (+)   Abdominal   Peds negative pediatric ROS (+)  Hematology negative hematology ROS (+)   Anesthesia Other Findings   Reproductive/Obstetrics negative OB ROS                         Anesthesia Physical Anesthesia Plan  ASA: III  Anesthesia Plan: General   Post-op Pain Management:    Induction: Intravenous  Airway Management Planned: LMA  Additional Equipment:   Intra-op Plan:   Post-operative Plan: Extubation in OR  Informed Consent: I have reviewed the patients History and Physical, chart, labs and discussed the procedure including the risks, benefits and alternatives for the proposed anesthesia with the patient or authorized representative who has indicated his/her understanding and acceptance.   Dental advisory given  Plan Discussed with: CRNA  Anesthesia Plan Comments:       Anesthesia Quick Evaluation

## 2013-07-11 NOTE — Op Note (Signed)
Preoperative diagnosis:  1. Right ureteral stent  Postoperative diagnosis:  1. Same   Procedure: 1. Cystoscopy, right ureteral stent removal  Surgeon: Crist FatBenjamin W. Herrick, MD  Anesthesia: General  Complications: None  Intraoperative findings: The stent was slightly encrusted, ureteral orifice was edematous, and the stent was extending quite far into the patient's bladder.  EBL: Minimal  Specimens: None  Indication: Michael Carroll is a 53 y.o. patient with a long history of nephrolithiasis. He is status post right ureteroscopy for a mid ureter obstructing stone. He also has a history of stent encrustation and difficult stent removal. I initially saw him 2 weeks after the stent was placed and the patient refused to have the stent removed without sedation. We then scheduled him for office cystoscopy and stent removal with oral sedation using Valium. However, the appointment was never made and several more weeks past. Given the patient's history of stent encrustation and his reluctance to have his stent removed in the office we opted to remove the stent under general anesthesia.  After reviewing the management options for treatment, he elected to proceed with the above surgical procedure(s). We have discussed the potential benefits and risks of the procedure, side effects of the proposed treatment, the likelihood of the patient achieving the goals of the procedure, and any potential problems that might occur during the procedure or recuperation. Informed consent has been obtained.  Description of procedure:  The patient was taken to the operating room and general anesthesia was induced.  The patient was placed in the dorsal lithotomy position, prepped and draped in the usual sterile fashion, and preoperative antibiotics were administered. A preoperative time-out was performed.   A 22 French 30 cystoscope was then gently passed to the patient's urethra and into the bladder. A 360  cystoscopic evaluation was performed with the above findings. Using a stent grasper the distal end of the stent was then grasped and gently pulled from the patient's bladder and removed under visual guidance. The stent came out without resistance, there was very little encrustation on the stent. 2% lidocaine jelly was then injected into the patient's urethra. He was subsequently awoken and returned to PACU in good condition.  Crist FatBenjamin W. Herrick, M.D.

## 2013-07-11 NOTE — Discharge Instructions (Signed)
DISCHARGE INSTRUCTIONS FOR KIDNEY STONE/URETERAL STENT   MEDICATIONS:  1.  Resume all your other meds from home - except do not take any extra narcotic pain meds that you may have at home.    ACTIVITY:  1. Activity as tolerated 2. No driving while on narcotic pain medications  3. Drink plenty of water  4. Continue to walk at home - you can still get blood clots when you are at home, so keep active, but don't over do it.  5. May return to work/school tomorrow or when you feel ready   BATHING:  1. You can shower and we recommend daily showers     SIGNS/SYMPTOMS TO CALL:  Please call us if you have a fever greater than 101.5, uncontrolled nausea/vomiting, uncontrolled pain, dizziness, unable to urinate, bloody urine, chest pain, shortness of breath, leg swelling, leg pain, redness around wound, drainage from wound, or any other concerns or questions.   You can reach us at [phone].   FOLLOW-UP:  1. You have an appointment in 6 weeks with a ultrasound of your kidneys prior.  Dietary Guidelines to Help Prevent Kidney Stones Your risk of kidney stones can be decreased by adjusting the foods you eat. The most important thing you can do is drink enough fluid. You should drink enough fluid to keep your urine clear or pale yellow. The following guidelines provide specific information for the type of kidney stone you have had. GUIDELINES ACCORDING TO TYPE OF KIDNEY STONE Calcium Oxalate Kidney Stones  Reduce the amount of salt you eat. Foods that have a lot of salt cause your body to release excess calcium into your urine. The excess calcium can combine with a substance called oxalate to form kidney stones.  Reduce the amount of animal protein you eat if the amount you eat is excessive. Animal protein causes your body to release excess calcium into your urine. Ask your dietitian how much protein from animal sources you should be eating.  Avoid foods that are high in oxalates. If you take  vitamins, they should have less than 500 mg of vitamin C. Your body turns vitamin C into oxalates. You do not need to avoid fruits and vegetables high in vitamin C. Calcium Phosphate Kidney Stones  Reduce the amount of salt you eat to help prevent the release of excess calcium into your urine.  Reduce the amount of animal protein you eat if the amount you eat is excessive. Animal protein causes your body to release excess calcium into your urine. Ask your dietitian how much protein from animal sources you should be eating.  Get enough calcium from food or take a calcium supplement (ask your dietitian for recommendations). Food sources of calcium that do not increase your risk of kidney stones include:  Broccoli.  Dairy products, such as cheese and yogurt.  Pudding. Uric Acid Kidney Stones  Do not have more than 6 oz of animal protein per day. FOOD SOURCES Animal Protein Sources  Meat (all types).  Poultry.  Eggs.  Fish, seafood. Foods High in MirantSalt  Salt seasonings.  Soy sauce.  Teriyaki sauce.  Cured and processed meats.  Salted crackers and snack foods.  Fast food.  Canned soups and most canned foods. Foods High in Oxalates  Grains:  Amaranth.  Barley.  Grits.  Wheat germ.  Bran.  Buckwheat flour.  All bran cereals.  Pretzels.  Whole wheat bread.  Vegetables:  Beans (wax).  Beets and beet greens.  Collard greens.  Eggplant.  Escarole.  Leeks.  Okra.  Parsley.  Rutabagas.  Spinach.  Swiss chard.  Tomato paste.  Fried potatoes.  Sweet potatoes.  Fruits:  Red currants.  Figs.  Kiwi.  Rhubarb.  Meat and Other Protein Sources:  Beans (dried).  Soy burgers and other soybean products.  Miso.  Nuts (peanuts, almonds, pecans, cashews, hazelnuts).  Nut butters.  Sesame seeds and tahini (paste made of sesame seeds).  Poppy seeds.  Beverages:  Chocolate drink mixes.  Soy milk.  Instant iced  tea.  Juices made from high-oxalate fruits or vegetables.  Other:  Carob.  Chocolate.  Fruitcake.  Marmalades. Document Released: 05/01/2010 Document Revised: 01/09/2013 Document Reviewed: 12/01/2012 [facility] Patient Information 2015 [place], [place]. This information is not intended to replace advice given to you by your health care provider. Make sure you discuss any questions you have with your health care provider.

## 2013-07-12 ENCOUNTER — Encounter (HOSPITAL_COMMUNITY): Payer: Self-pay | Admitting: Urology

## 2013-12-15 ENCOUNTER — Emergency Department (HOSPITAL_BASED_OUTPATIENT_CLINIC_OR_DEPARTMENT_OTHER): Payer: Medicaid Other

## 2013-12-15 ENCOUNTER — Emergency Department (HOSPITAL_BASED_OUTPATIENT_CLINIC_OR_DEPARTMENT_OTHER)
Admission: EM | Admit: 2013-12-15 | Discharge: 2013-12-16 | Disposition: A | Discharge status: Home / Self Care | Payer: Medicaid Other | Attending: Emergency Medicine | Admitting: Emergency Medicine

## 2013-12-15 ENCOUNTER — Encounter (HOSPITAL_BASED_OUTPATIENT_CLINIC_OR_DEPARTMENT_OTHER): Payer: Self-pay | Admitting: Emergency Medicine

## 2013-12-15 DIAGNOSIS — I10 Essential (primary) hypertension: Secondary | ICD-10-CM | POA: Insufficient documentation

## 2013-12-15 DIAGNOSIS — R7401 Elevation of levels of liver transaminase levels: Secondary | ICD-10-CM

## 2013-12-15 DIAGNOSIS — E119 Type 2 diabetes mellitus without complications: Secondary | ICD-10-CM | POA: Insufficient documentation

## 2013-12-15 DIAGNOSIS — R109 Unspecified abdominal pain: Secondary | ICD-10-CM

## 2013-12-15 DIAGNOSIS — R10A1 Flank pain, right side: Secondary | ICD-10-CM

## 2013-12-15 DIAGNOSIS — Z9189 Other specified personal risk factors, not elsewhere classified: Secondary | ICD-10-CM | POA: Insufficient documentation

## 2013-12-15 DIAGNOSIS — Z7982 Long term (current) use of aspirin: Secondary | ICD-10-CM | POA: Insufficient documentation

## 2013-12-15 DIAGNOSIS — K8500 Idiopathic acute pancreatitis without necrosis or infection: Secondary | ICD-10-CM

## 2013-12-15 DIAGNOSIS — Z79899 Other long term (current) drug therapy: Secondary | ICD-10-CM | POA: Insufficient documentation

## 2013-12-15 DIAGNOSIS — Z87442 Personal history of urinary calculi: Secondary | ICD-10-CM | POA: Insufficient documentation

## 2013-12-15 DIAGNOSIS — Z720 Tobacco use: Secondary | ICD-10-CM | POA: Insufficient documentation

## 2013-12-15 DIAGNOSIS — Z9049 Acquired absence of other specified parts of digestive tract: Secondary | ICD-10-CM | POA: Insufficient documentation

## 2013-12-15 DIAGNOSIS — R740 Nonspecific elevation of levels of transaminase and lactic acid dehydrogenase [LDH]: Secondary | ICD-10-CM | POA: Insufficient documentation

## 2013-12-15 MED ORDER — HYDROMORPHONE HCL 1 MG/ML IJ SOLN
1.0000 mg | Freq: Once | INTRAMUSCULAR | Status: AC
  Administered 2013-12-16: 1 mg via INTRAVENOUS
  Filled 2013-12-15: qty 1

## 2013-12-15 MED ORDER — SODIUM CHLORIDE 0.9 % IV SOLN
1000.0000 mL | INTRAVENOUS | Status: DC
  Administered 2013-12-16: 1000 mL via INTRAVENOUS

## 2013-12-15 MED ORDER — SODIUM CHLORIDE 0.9 % IV SOLN
1000.0000 mL | Freq: Once | INTRAVENOUS | Status: AC
  Administered 2013-12-16: 1000 mL via INTRAVENOUS

## 2013-12-15 MED ORDER — PROMETHAZINE HCL 25 MG/ML IJ SOLN
25.0000 mg | Freq: Once | INTRAMUSCULAR | Status: AC
  Administered 2013-12-16: 25 mg via INTRAVENOUS
  Filled 2013-12-15: qty 1

## 2013-12-15 NOTE — ED Provider Notes (Signed)
CSN: [id number]     Arrival date & time 12/15/13  2103 History  This chart was scribed for Dione Booze, MD by Gwenyth Ober, ED Scribe. This patient was seen in room MH06/MH06 and the patient's care was started at 11:21 PM.    Chief Complaint  Patient presents with  . Flank Pain   The history is provided by the patient. No language interpreter was used.    HPI Comments: Michael Carroll is a 53 y.o. male with a history of kidney stones who presents to the Emergency Department complaining of 6/10, intermittent, right-sided flank pain that radiates towards his groin and started 1.5 weeks ago. He notes bloating and nausea as associated symptoms. Pt has history of kidney stones and states current symptoms are similar, but that today is worse than normal. He has tried Ibuprofen with no relief to pain. He typically takes Phenergan for nausea. Pt denies dysuria, fever, chills and diaphoresis as associated symptoms.  No PCP Urologist is Marlou Porch   Past Medical History  Diagnosis Date  . Cirrhosis of liver without mention of alcohol     PER LIVER BX 03/2012  . Right ureteral stone   . History of kidney stones   . Type 2 diabetes, diet controlled   . History of migraine   . Hypertension     NO MEDS  . Smokers' cough   . Productive cough   . At risk for sleep apnea     STOP-BANG=  4     . Headache(784.0)     migraines   Past Surgical History  Procedure Laterality Date  . Cholecystectomy N/A 03/26/2012    Procedure: LAPAROSCOPIC CHOLECYSTECTOMY;  Surgeon: Romie Levee, MD;  Location: [facility];  Service: General;  Laterality: N/A;  . Liver biopsy N/A 03/26/2012    Procedure: LIVER BIOPSY;  Surgeon: Romie Levee, MD;  Location: [facility];  Service: General;  Laterality: N/A;  . Cystoscopy with retrograde pyelogram, ureteroscopy and stent placement Right 05/15/2013    Procedure: CYSTOSCOPY WITH RETROGRADE PYELOGRAM, URETEROSCOPY AND STENT PLACEMENT;  Surgeon: Crist Fat, MD;  Location: [facility];  Service: Urology;  Laterality: Right;  . Cysto/  right ureteral stent placement  06/2006  . Cysto/   right ureteral stent exchange  12/2007  . Extracorporeal shock wave lithotripsy    . Cardiovascular stress test  2004    CARDIOLITE STUDY NORMAL/  EF 60%  . Cystoscopy with retrograde pyelogram, ureteroscopy and stent placement Right 05/30/2013    Procedure: CYSTOSCOPY WITH RIGHT RETROGRADE PYELOGRAM, URETEROSCOPY LASER LITHOTRIPSY AND STENT EXCHANGE ;  Surgeon: Crist Fat, MD;  Location: [facility];  Service: Urology;  Laterality: Right;  . Holmium laser application Right 05/30/2013    Procedure: HOLMIUM LASER APPLICATION;  Surgeon: Crist Fat, MD;  Location: [facility];  Service: Urology;  Laterality: Right;  . Cystoscopy  07/11/2013    Procedure: CYSTOSCOPY RIGHT STENT REMOVAL;  Surgeon: Crist Fat, MD;  Location: [facility];  Service: Urology;;   Family History  Problem Relation Age of Onset  . Cancer - Lung Mother   . Cancer Mother   . Seizures Sister   . Heart disease Father    History  Substance Use Topics  . Smoking status: Current Every Day Smoker -- 1.50 packs/day for 25 years    Types: Cigarettes  . Smokeless tobacco: Never Used  . Alcohol Use: No    Review of Systems  Constitutional: Negative for fever,  chills and diaphoresis.  Gastrointestinal: Positive for nausea and abdominal pain.  Genitourinary: Positive for flank pain. Negative for dysuria.  All other systems reviewed and are negative.   Allergies  Zofran and Compazine  Home Medications   Prior to Admission medications   Medication Sig Start Date End Date Taking? Authorizing Provider  aspirin 81 MG tablet Take 81 mg by mouth every evening.     Historical Provider, MD  phenazopyridine (PYRIDIUM) 200 MG tablet Take 1 tablet (200 mg total) by mouth 3 (three) times daily as needed for pain. 05/30/13   Crist FatBenjamin W Herrick, MD  tamsulosin (FLOMAX) 0.4 MG CAPS  capsule Take 1 capsule (0.4 mg total) by mouth daily. 05/30/13   Crist FatBenjamin W Herrick, MD  tetrahydrozoline-zinc (VISINE-AC) 0.05-0.25 % ophthalmic solution Place 1 drop into both eyes daily as needed (for dry eyes).    Historical Provider, MD   BP 175/111 mmHg  Pulse 90  Temp(Src) 98.8 F (37.1 C) (Oral)  Resp 26  Ht 6' (1.829 m)  Wt 258 lb (117.028 kg)  BMI 34.98 kg/m2  SpO2 98% Physical Exam  Constitutional: He is oriented to person, place, and time. He appears well-developed and well-nourished. No distress.  HENT:  Head: Normocephalic and atraumatic.  Eyes: Conjunctivae and EOM are normal. Pupils are equal, round, and reactive to light.  Neck: Normal range of motion. Neck supple. No JVD present.  Cardiovascular: Normal rate, regular rhythm and normal heart sounds.   No murmur heard. Pulmonary/Chest: Effort normal and breath sounds normal. No respiratory distress. He has no wheezes. He has no rales.  Abdominal: Soft. He exhibits no mass. There is tenderness. There is no rebound and no guarding.  Moderate tenderness right mid-abdomen. Right CVA tenderness. Bowel sounds decreased.   Musculoskeletal: Normal range of motion. He exhibits no edema.  Lymphadenopathy:    He has no cervical adenopathy.  Neurological: He is alert and oriented to person, place, and time. He has normal reflexes. No cranial nerve deficit. Coordination normal.  Skin: Skin is warm and dry. No rash noted.  Psychiatric: He has a normal mood and affect. His behavior is normal. Thought content normal.  Nursing note and vitals reviewed.   ED Course  Procedures (including critical care time) DIAGNOSTIC STUDIES: Oxygen Saturation is 98% on RA, normal by my interpretation.    COORDINATION OF CARE: 11:27 PM Discussed treatment plan which includes CT Renal Stone Study and lab work. Pt agreed to plan.    Labs Review Results for orders placed or performed during the hospital encounter of 12/15/13  Comprehensive  metabolic panel  Result Value Ref Range   Sodium 142 137 - 147 mEq/L   Potassium 4.7 3.7 - 5.3 mEq/L   Chloride 102 96 - 112 mEq/L   CO2 29 19 - 32 mEq/L   Glucose, Bld 99 70 - 99 mg/dL   BUN 23 6 - 23 mg/dL   Creatinine, Ser 1.611.00 0.50 - 1.35 mg/dL   Calcium 09.610.1 8.4 - 04.510.5 mg/dL   Total Protein 7.4 6.0 - 8.3 g/dL   Albumin 3.7 3.5 - 5.2 g/dL   AST 70 (H) 0 - 37 U/L   ALT 94 (H) 0 - 53 U/L   Alkaline Phosphatase 78 39 - 117 U/L   Total Bilirubin 0.7 0.3 - 1.2 mg/dL   GFR calc non Af Amer 85 (L) >90 mL/min   GFR calc Af Amer >90 >90 mL/min   Anion gap 11 5 - 15  Lipase, blood  Result Value Ref Range   Lipase 268 (H) 11 - 59 U/L  CBC with Differential  Result Value Ref Range   WBC 7.4 4.0 - 10.5 K/uL   RBC 5.75 4.22 - 5.81 MIL/uL   Hemoglobin 17.1 (H) 13.0 - 17.0 g/dL   HCT 16.1 09.6 - 04.5 %   MCV 85.0 78.0 - 100.0 fL   MCH 29.7 26.0 - 34.0 pg   MCHC 35.0 30.0 - 36.0 g/dL   RDW 40.9 81.1 - 91.4 %   Platelets 102 (L) 150 - 400 K/uL   Neutrophils Relative % 53 43 - 77 %   Lymphocytes Relative 35 12 - 46 %   Monocytes Relative 9 3 - 12 %   Eosinophils Relative 2 0 - 5 %   Basophils Relative 1 0 - 1 %   Neutro Abs 3.9 1.7 - 7.7 K/uL   Lymphs Abs 2.6 0.7 - 4.0 K/uL   Monocytes Absolute 0.7 0.1 - 1.0 K/uL   Eosinophils Absolute 0.1 0.0 - 0.7 K/uL   Basophils Absolute 0.1 0.0 - 0.1 K/uL   Smear Review PLATELETS APPEAR DECREASED   Urinalysis, Routine w reflex microscopic  Result Value Ref Range   Color, Urine YELLOW YELLOW   APPearance CLEAR CLEAR   Specific Gravity, Urine 1.024 1.005 - 1.030   pH 6.5 5.0 - 8.0   Glucose, UA NEGATIVE NEGATIVE mg/dL   Hgb urine dipstick NEGATIVE NEGATIVE   Bilirubin Urine NEGATIVE NEGATIVE   Ketones, ur NEGATIVE NEGATIVE mg/dL   Protein, ur NEGATIVE NEGATIVE mg/dL   Urobilinogen, UA 4.0 (H) 0.0 - 1.0 mg/dL   Nitrite NEGATIVE NEGATIVE   Leukocytes, UA NEGATIVE NEGATIVE   Imaging Review Ct Renal Stone Study  12/15/2013   CLINICAL  DATA:  Acute intermittent right-sided flank pain, radiating to the groin starting 1-1/2 weeks ago. Associated abdominal bloating. Initial encounter.  EXAM: CT ABDOMEN AND PELVIS WITHOUT CONTRAST  TECHNIQUE: Multidetector CT imaging of the abdomen and pelvis was performed following the standard protocol without IV contrast.  COMPARISON:  CT of the abdomen and pelvis performed 05/15/2013  FINDINGS: The visualized lung bases are clear.  The liver and spleen are unremarkable in appearance. The patient is status post cholecystectomy, with clips noted along the gallbladder fossa. The pancreas and adrenal glands are unremarkable.  Minimal right-sided hydronephrosis is noted, with mild stranding about the proximal right ureter. However, no obstructing stone is seen. This may reflect a recently passed right ureteral stone. The kidneys are otherwise unremarkable. No nonobstructing renal stones are identified. No significant perinephric stranding is appreciated.  No free fluid is identified. The small bowel is unremarkable in appearance. The stomach is within normal limits. No acute vascular abnormalities are seen.  The appendix is normal in caliber, without evidence for appendicitis. The colon is unremarkable in appearance.  The bladder is mildly distended and grossly unremarkable. A tiny urachal remnant is incidentally noted. The prostate remains normal in size, with minimal calcification. No inguinal lymphadenopathy is seen.  No acute osseous abnormalities are identified.  IMPRESSION: Minimal right-sided hydronephrosis, with mild stranding about the proximal right ureter. However, no obstructing stone is seen. This may reflect a recently passed right ureteral stone. Kidneys otherwise unremarkable in appearance.   Electronically Signed   By: Roanna Raider M.D.   On: 12/15/2013 23:40     MDM   Final diagnoses:  Right flank pain  Idiopathic acute pancreatitis  Elevated transaminase level    Flank pain suspicious  for  urolithiasis in patient with known urolithiasis. However, on review of past records, his last CT scan had not shown any stones in his kidneys. He is given hydromorphone for pain and promethazine for nausea and sent for CT scan. CT scan shows no evidence of urolithiasis. Screening labs have been obtained and are significant for lipase of 298. Have discussed with the patient needs findings. There is also some mildly elevated transaminases. He denies ethanol consumption. Hospital depression was offered but he stated that pain was being adequately controlled. Because of elevated transaminases, pain medication with acetaminophen is avoided and is discharged with prescription for promethazine and oxycodone. He is to follow-up with his PCP in several days and return have symptoms are worsening or pain is not being adequately controlled at home.   I personally performed the services described in this documentation, which was scribed in my presence. The recorded information has been reviewed and is accurate.      Dione Boozeavid Victorina Kable, MD 12/16/13 [phone]

## 2013-12-15 NOTE — ED Notes (Signed)
Pt presents to ED with complaints of right flank pain. Pt has history of over 30 stones and 13 stents.

## 2013-12-16 LAB — URINALYSIS, ROUTINE W REFLEX MICROSCOPIC
Bilirubin Urine: NEGATIVE
GLUCOSE, UA: NEGATIVE mg/dL
Hgb urine dipstick: NEGATIVE
Ketones, ur: NEGATIVE mg/dL
LEUKOCYTES UA: NEGATIVE
Nitrite: NEGATIVE
PH: 6.5 (ref 5.0–8.0)
Protein, ur: NEGATIVE mg/dL
Specific Gravity, Urine: 1.024 (ref 1.005–1.030)
Urobilinogen, UA: 4 mg/dL — ABNORMAL HIGH (ref 0.0–1.0)

## 2013-12-16 LAB — CBC WITH DIFFERENTIAL/PLATELET
BASOS PCT: 1 % (ref 0–1)
Basophils Absolute: 0.1 10*3/uL (ref 0.0–0.1)
EOS PCT: 2 % (ref 0–5)
Eosinophils Absolute: 0.1 10*3/uL (ref 0.0–0.7)
HCT: 48.9 % (ref 39.0–52.0)
HEMOGLOBIN: 17.1 g/dL — AB (ref 13.0–17.0)
Lymphocytes Relative: 35 % (ref 12–46)
Lymphs Abs: 2.6 10*3/uL (ref 0.7–4.0)
MCH: 29.7 pg (ref 26.0–34.0)
MCHC: 35 g/dL (ref 30.0–36.0)
MCV: 85 fL (ref 78.0–100.0)
Monocytes Absolute: 0.7 10*3/uL (ref 0.1–1.0)
Monocytes Relative: 9 % (ref 3–12)
Neutro Abs: 3.9 10*3/uL (ref 1.7–7.7)
Neutrophils Relative %: 53 % (ref 43–77)
Platelets: 102 10*3/uL — ABNORMAL LOW (ref 150–400)
RBC: 5.75 MIL/uL (ref 4.22–5.81)
RDW: 12.5 % (ref 11.5–15.5)
Smear Review: DECREASED
WBC: 7.4 10*3/uL (ref 4.0–10.5)

## 2013-12-16 LAB — COMPREHENSIVE METABOLIC PANEL
ALBUMIN: 3.7 g/dL (ref 3.5–5.2)
ALK PHOS: 78 U/L (ref 39–117)
ALT: 94 U/L — AB (ref 0–53)
ANION GAP: 11 (ref 5–15)
AST: 70 U/L — ABNORMAL HIGH (ref 0–37)
BUN: 23 mg/dL (ref 6–23)
CALCIUM: 10.1 mg/dL (ref 8.4–10.5)
CO2: 29 mEq/L (ref 19–32)
Chloride: 102 mEq/L (ref 96–112)
Creatinine, Ser: 1 mg/dL (ref 0.50–1.35)
GFR calc Af Amer: >90 mL/min (ref >90)
GFR calc non Af Amer: 85 mL/min — ABNORMAL LOW (ref >90)
GLUCOSE: 99 mg/dL (ref 70–99)
Potassium: 4.7 mEq/L (ref 3.7–5.3)
SODIUM: 142 meq/L (ref 137–147)
Total Bilirubin: 0.7 mg/dL (ref 0.3–1.2)
Total Protein: 7.4 g/dL (ref 6.0–8.3)

## 2013-12-16 LAB — LIPASE, BLOOD: Lipase: 268 U/L — ABNORMAL HIGH (ref 11–59)

## 2013-12-16 MED ORDER — PROMETHAZINE HCL 25 MG PO TABS: 25.0000 mg | ORAL_TABLET | Freq: Four times a day (QID) | ORAL | Status: DC | PRN

## 2013-12-16 MED ORDER — OXYCODONE HCL 5 MG PO TABS: 5.0000 mg | ORAL_TABLET | ORAL | Status: DC | PRN

## 2013-12-16 NOTE — Discharge Instructions (Signed)
Acute Pancreatitis Acute pancreatitis is a disease in which the pancreas becomes suddenly inflamed. The pancreas is a large gland located behind your stomach. The pancreas produces enzymes that help digest food. The pancreas also releases the hormones glucagon and insulin that help regulate blood sugar. Damage to the pancreas occurs when the digestive enzymes from the pancreas are activated and begin attacking the pancreas before being released into the intestine. Most acute attacks last a couple of days and can cause serious complications. Some people become dehydrated and develop low blood pressure. In severe cases, bleeding into the pancreas can lead to shock and can be life-threatening. The lungs, heart, and kidneys may fail. CAUSES  Pancreatitis can happen to anyone. In some cases, the cause is unknown. Most cases are caused by:  Alcohol abuse.  Gallstones. Other less common causes are:  Certain medicines.  Exposure to certain chemicals.  Infection.  Damage caused by an accident (trauma).  Abdominal surgery. SYMPTOMS   Pain in the upper abdomen that may radiate to the back.  Tenderness and swelling of the abdomen.  Nausea and vomiting. DIAGNOSIS  Your caregiver will perform a physical exam. Blood and stool tests may be done to confirm the diagnosis. Imaging tests may also be done, such as X-rays, CT scans, or an ultrasound of the abdomen. TREATMENT  Treatment usually requires a stay in the hospital. Treatment may include:  Pain medicine.  Fluid replacement through an intravenous line (IV).  Placing a tube in the stomach to remove stomach contents and control vomiting.  Not eating for 3 or 4 days. This gives your pancreas a rest, because enzymes are not being produced that can cause further damage.  Antibiotic medicines if your condition is caused by an infection.  Surgery of the pancreas or gallbladder. HOME CARE INSTRUCTIONS   Follow the diet advised by your  caregiver. This may involve avoiding alcohol and decreasing the amount of fat in your diet.  Eat smaller, more frequent meals. This reduces the amount of digestive juices the pancreas produces.  Drink enough fluids to keep your urine clear or pale yellow.  Only take over-the-counter or prescription medicines as directed by your caregiver.  Avoid drinking alcohol if it caused your condition.  Do not smoke.  Get plenty of rest.  Check your blood sugar at home as directed by your caregiver.  Keep all follow-up appointments as directed by your caregiver. SEEK MEDICAL CARE IF:   You do not recover as quickly as expected.  You develop new or worsening symptoms.  You have persistent pain, weakness, or nausea.  You recover and then have another episode of pain. SEEK IMMEDIATE MEDICAL CARE IF:   You are unable to eat or keep fluids down.  Your pain becomes severe.  You have a fever or persistent symptoms for more than 2 to 3 days.  You have a fever and your symptoms suddenly get worse.  Your skin or the white part of your eyes turn yellow (jaundice).  You develop vomiting.  You feel dizzy, or you faint.  Your blood sugar is high (over 300 mg/dL). MAKE SURE YOU:   Understand these instructions.  Will watch your condition.  Will get help right away if you are not doing well or get worse. Document Released: 01/04/2005 Document Revised: 07/06/2011 Document Reviewed: 04/15/2011 [facility] Patient Information 2015 [place], [place]. This information is not intended to replace advice given to you by your health care provider. Make sure you discuss any questions you have  with your health care provider.  Promethazine tablets What is this medicine? PROMETHAZINE (proe METH a zeen) is an antihistamine. It is used to treat allergic reactions and to treat or prevent nausea and vomiting from illness or motion sickness. It is also used to make you sleep before surgery, and to help treat  pain or nausea after surgery. This medicine may be used for other purposes; ask your health care provider or pharmacist if you have questions. COMMON BRAND NAME(S): Phenergan What should I tell my health care provider before I take this medicine? They need to know if you have any of these conditions: -glaucoma -high blood pressure or heart disease -kidney disease -liver disease -lung or breathing disease, like asthma -prostate trouble -pain or difficulty passing urine -seizures -an unusual or allergic reaction to promethazine or phenothiazines, other medicines, foods, dyes, or preservatives -pregnant or trying to get pregnant -breast-feeding How should I use this medicine? Take this medicine by mouth with a glass of water. Follow the directions on the prescription label. Take your doses at regular intervals. Do not take your medicine more often than directed. Talk to your pediatrician regarding the use of this medicine in children. Special care may be needed. This medicine should not be given to infants and children younger than 192 years old. Overdosage: If you think you have taken too much of this medicine contact a poison control center or emergency room at once. NOTE: This medicine is only for you. Do not share this medicine with others. What if I miss a dose? If you miss a dose, take it as soon as you can. If it is almost time for your next dose, take only that dose. Do not take double or extra doses. What may interact with this medicine? Do not take this medicine with any of the following medications: -cisapride -dofetilide -dronedarone -MAOIs like Carbex, Eldepryl, Marplan, Nardil, Parnate -pimozide -quinidine, including dextromethorphan; quinidine -thioridazine -ziprasidone This medicine may also interact with the following medications: -certain medicines for depression, anxiety, or psychotic disturbances -certain medicines for anxiety or sleep -certain medicines for  seizures like carbamazepine, phenobarbital, phenytoin -certain medicines for movement abnormalities as in Parkinson's disease, or for gastrointestinal problems -epinephrine -medicines for allergies or colds -muscle relaxants -narcotic medicines for pain -other medicines that prolong the QT interval (cause an abnormal heart rhythm) -tramadol -trimethobenzamide This list may not describe all possible interactions. Give your health care provider a list of all the medicines, herbs, non-prescription drugs, or dietary supplements you use. Also tell them if you smoke, drink alcohol, or use illegal drugs. Some items may interact with your medicine. What should I watch for while using this medicine? Tell your doctor or health care professional if your symptoms do not start to get better in 1 to 2 days. You may get drowsy or dizzy. Do not drive, use machinery, or do anything that needs mental alertness until you know how this medicine affects you. To reduce the risk of dizzy or fainting spells, do not stand or sit up quickly, especially if you are an older patient. Alcohol may increase dizziness and drowsiness. Avoid alcoholic drinks. Your mouth may get dry. Chewing sugarless gum or sucking hard candy, and drinking plenty of water may help. Contact your doctor if the problem does not go away or is severe. This medicine may cause dry eyes and blurred vision. If you wear contact lenses you may feel some discomfort. Lubricating drops may help. See your eye doctor if the problem does  not go away or is severe. This medicine can make you more sensitive to the sun. Keep out of the sun. If you cannot avoid being in the sun, wear protective clothing and use sunscreen. Do not use sun lamps or tanning beds/booths. If you are diabetic, check your blood-sugar levels regularly. What side effects may I notice from receiving this medicine? Side effects that you should report to your doctor or health care professional as  soon as possible: -blurred vision -irregular heartbeat, palpitations or chest pain -muscle or facial twitches -pain or difficulty passing urine -seizures -skin rash -slowed or shallow breathing -unusual bleeding or bruising -yellowing of the eyes or skin Side effects that usually do not require medical attention (report to your doctor or health care professional if they continue or are bothersome): -headache -nightmares, agitation, nervousness, excitability, not able to sleep (these are more likely in children) -stuffy nose This list may not describe all possible side effects. Call your doctor for medical advice about side effects. You may report side effects to FDA at 1-800-FDA-1088. Where should I keep my medicine? Keep out of the reach of children. Store at room temperature, between 20 and 25 degrees C (68 and 77 degrees F). Protect from light. Throw away any unused medicine after the expiration date. NOTE: This sheet is a summary. It may not cover all possible information. If you have questions about this medicine, talk to your doctor, pharmacist, or health care provider.  2015, [employer]. (2012-09-05 15:04:46)  Acetaminophen; Oxycodone tablets What is this medicine? ACETAMINOPHEN; OXYCODONE (a set a MEE noe fen; ox i KOE done) is a pain reliever. It is used to treat mild to moderate pain. This medicine may be used for other purposes; ask your health care provider or pharmacist if you have questions. COMMON BRAND NAME(S): Endocet, Magnacet, Narvox, Percocet, Perloxx, Primalev, Primlev, Roxicet, Xolox What should I tell my health care provider before I take this medicine? They need to know if you have any of these conditions: -brain tumor -Crohn's disease, inflammatory bowel disease, or ulcerative colitis -drug abuse or addiction -head injury -heart or circulation problems -if you often drink alcohol -kidney disease or problems going to the bathroom -liver  disease -lung disease, asthma, or breathing problems -an unusual or allergic reaction to acetaminophen, oxycodone, other opioid analgesics, other medicines, foods, dyes, or preservatives -pregnant or trying to get pregnant -breast-feeding How should I use this medicine? Take this medicine by mouth with a full glass of water. Follow the directions on the prescription label. Take your medicine at regular intervals. Do not take your medicine more often than directed. Talk to your pediatrician regarding the use of this medicine in children. Special care may be needed. Patients over 425 years old may have a stronger reaction and need a smaller dose. Overdosage: If you think you have taken too much of this medicine contact a poison control center or emergency room at once. NOTE: This medicine is only for you. Do not share this medicine with others. What if I miss a dose? If you miss a dose, take it as soon as you can. If it is almost time for your next dose, take only that dose. Do not take double or extra doses. What may interact with this medicine? -alcohol -antihistamines -barbiturates like amobarbital, butalbital, butabarbital, methohexital, pentobarbital, phenobarbital, thiopental, and secobarbital -benztropine -drugs for bladder problems like solifenacin, trospium, oxybutynin, tolterodine, hyoscyamine, and methscopolamine -drugs for breathing problems like ipratropium and tiotropium -drugs for certain stomach or  intestine problems like propantheline, homatropine methylbromide, glycopyrrolate, atropine, belladonna, and dicyclomine -general anesthetics like etomidate, ketamine, nitrous oxide, propofol, desflurane, enflurane, halothane, isoflurane, and sevoflurane -medicines for depression, anxiety, or psychotic disturbances -medicines for sleep -muscle relaxants -naltrexone -narcotic medicines (opiates) for pain -phenothiazines like perphenazine, thioridazine, chlorpromazine, mesoridazine,  fluphenazine, prochlorperazine, promazine, and trifluoperazine -scopolamine -tramadol -trihexyphenidyl This list may not describe all possible interactions. Give your health care provider a list of all the medicines, herbs, non-prescription drugs, or dietary supplements you use. Also tell them if you smoke, drink alcohol, or use illegal drugs. Some items may interact with your medicine. What should I watch for while using this medicine? Tell your doctor or health care professional if your pain does not go away, if it gets worse, or if you have new or a different type of pain. You may develop tolerance to the medicine. Tolerance means that you will need a higher dose of the medication for pain relief. Tolerance is normal and is expected if you take this medicine for a long time. Do not suddenly stop taking your medicine because you may develop a severe reaction. Your body becomes used to the medicine. This does NOT mean you are addicted. Addiction is a behavior related to getting and using a drug for a non-medical reason. If you have pain, you have a medical reason to take pain medicine. Your doctor will tell you how much medicine to take. If your doctor wants you to stop the medicine, the dose will be slowly lowered over time to avoid any side effects. You may get drowsy or dizzy. Do not drive, use machinery, or do anything that needs mental alertness until you know how this medicine affects you. Do not stand or sit up quickly, especially if you are an older patient. This reduces the risk of dizzy or fainting spells. Alcohol may interfere with the effect of this medicine. Avoid alcoholic drinks. There are different types of narcotic medicines (opiates) for pain. If you take more than one type at the same time, you may have more side effects. Give your health care provider a list of all medicines you use. Your doctor will tell you how much medicine to take. Do not take more medicine than directed. Call  emergency for help if you have problems breathing. The medicine will cause constipation. Try to have a bowel movement at least every 2 to 3 days. If you do not have a bowel movement for 3 days, call your doctor or health care professional. Do not take Tylenol (acetaminophen) or medicines that have acetaminophen with this medicine. Too much acetaminophen can be very dangerous. Many nonprescription medicines contain acetaminophen. Always read the labels carefully to avoid taking more acetaminophen. What side effects may I notice from receiving this medicine? Side effects that you should report to your doctor or health care professional as soon as possible: -allergic reactions like skin rash, itching or hives, swelling of the face, lips, or tongue -breathing difficulties, wheezing -confusion -light headedness or fainting spells -severe stomach pain -unusually weak or tired -yellowing of the skin or the whites of the eyes Side effects that usually do not require medical attention (report to your doctor or health care professional if they continue or are bothersome): -dizziness -drowsiness -nausea -vomiting This list may not describe all possible side effects. Call your doctor for medical advice about side effects. You may report side effects to FDA at 1-800-FDA-1088. Where should I keep my medicine? Keep out of the reach of children.  This medicine can be abused. Keep your medicine in a safe place to protect it from theft. Do not share this medicine with anyone. Selling or giving away this medicine is dangerous and against the law. Store at room temperature between 20 and 25 degrees C (68 and 77 degrees F). Keep container tightly closed. Protect from light. This medicine may cause accidental overdose and death if it is taken by other adults, children, or pets. Flush any unused medicine down the toilet to reduce the chance of harm. Do not use the medicine after the expiration date. NOTE: This sheet  is a summary. It may not cover all possible information. If you have questions about this medicine, talk to your doctor, pharmacist, or health care provider.  2015, [employer]. (2012-08-28 13:17:35)

## 2014-01-20 ENCOUNTER — Emergency Department (HOSPITAL_BASED_OUTPATIENT_CLINIC_OR_DEPARTMENT_OTHER)
Admission: EM | Admit: 2014-01-20 | Discharge: 2014-01-21 | Disposition: A | Discharge status: Home / Self Care | Payer: Self-pay | Attending: Emergency Medicine | Admitting: Emergency Medicine

## 2014-01-20 ENCOUNTER — Encounter (HOSPITAL_BASED_OUTPATIENT_CLINIC_OR_DEPARTMENT_OTHER): Payer: Self-pay | Admitting: *Deleted

## 2014-01-20 ENCOUNTER — Emergency Department (HOSPITAL_BASED_OUTPATIENT_CLINIC_OR_DEPARTMENT_OTHER): Payer: Medicaid Other

## 2014-01-20 DIAGNOSIS — Z87442 Personal history of urinary calculi: Secondary | ICD-10-CM | POA: Insufficient documentation

## 2014-01-20 DIAGNOSIS — Z8719 Personal history of other diseases of the digestive system: Secondary | ICD-10-CM | POA: Insufficient documentation

## 2014-01-20 DIAGNOSIS — R109 Unspecified abdominal pain: Secondary | ICD-10-CM | POA: Insufficient documentation

## 2014-01-20 DIAGNOSIS — Z720 Tobacco use: Secondary | ICD-10-CM | POA: Insufficient documentation

## 2014-01-20 DIAGNOSIS — Z7982 Long term (current) use of aspirin: Secondary | ICD-10-CM | POA: Insufficient documentation

## 2014-01-20 DIAGNOSIS — Z79899 Other long term (current) drug therapy: Secondary | ICD-10-CM | POA: Insufficient documentation

## 2014-01-20 DIAGNOSIS — Z8669 Personal history of other diseases of the nervous system and sense organs: Secondary | ICD-10-CM | POA: Insufficient documentation

## 2014-01-20 DIAGNOSIS — I10 Essential (primary) hypertension: Secondary | ICD-10-CM | POA: Insufficient documentation

## 2014-01-20 DIAGNOSIS — R112 Nausea with vomiting, unspecified: Secondary | ICD-10-CM | POA: Insufficient documentation

## 2014-01-20 DIAGNOSIS — E119 Type 2 diabetes mellitus without complications: Secondary | ICD-10-CM | POA: Insufficient documentation

## 2014-01-20 DIAGNOSIS — Z9089 Acquired absence of other organs: Secondary | ICD-10-CM | POA: Insufficient documentation

## 2014-01-20 LAB — URINALYSIS, ROUTINE W REFLEX MICROSCOPIC
Bilirubin Urine: NEGATIVE
Glucose, UA: NEGATIVE mg/dL
Hgb urine dipstick: NEGATIVE
Ketones, ur: NEGATIVE mg/dL
LEUKOCYTES UA: NEGATIVE
NITRITE: NEGATIVE
PH: 6 (ref 5.0–8.0)
Protein, ur: NEGATIVE mg/dL
SPECIFIC GRAVITY, URINE: 1.021 (ref 1.005–1.030)
Urobilinogen, UA: 4 mg/dL — ABNORMAL HIGH (ref 0.0–1.0)

## 2014-01-20 LAB — CBC WITH DIFFERENTIAL/PLATELET
BASOS PCT: 1 % (ref 0–1)
Basophils Absolute: 0 10*3/uL (ref 0.0–0.1)
Eosinophils Absolute: 0.2 10*3/uL (ref 0.0–0.7)
Eosinophils Relative: 3 % (ref 0–5)
HCT: 49.4 % (ref 39.0–52.0)
HEMOGLOBIN: 17.1 g/dL — AB (ref 13.0–17.0)
LYMPHS ABS: 2.1 10*3/uL (ref 0.7–4.0)
Lymphocytes Relative: 32 % (ref 12–46)
MCH: 29.8 pg (ref 26.0–34.0)
MCHC: 34.6 g/dL (ref 30.0–36.0)
MCV: 86.1 fL (ref 78.0–100.0)
Monocytes Absolute: 0.7 10*3/uL (ref 0.1–1.0)
Monocytes Relative: 11 % (ref 3–12)
NEUTROS PCT: 53 % (ref 43–77)
Neutro Abs: 3.5 10*3/uL (ref 1.7–7.7)
Platelets: 108 10*3/uL — ABNORMAL LOW (ref 150–400)
RBC: 5.74 MIL/uL (ref 4.22–5.81)
RDW: 12.7 % (ref 11.5–15.5)
WBC: 6.5 10*3/uL (ref 4.0–10.5)

## 2014-01-20 LAB — COMPREHENSIVE METABOLIC PANEL
ALBUMIN: 4.1 g/dL (ref 3.5–5.2)
ALK PHOS: 79 U/L (ref 39–117)
ALT: 75 U/L — ABNORMAL HIGH (ref 0–53)
ANION GAP: 5 (ref 5–15)
AST: 54 U/L — AB (ref 0–37)
BILIRUBIN TOTAL: 0.8 mg/dL (ref 0.3–1.2)
BUN: 16 mg/dL (ref 6–23)
CHLORIDE: 105 meq/L (ref 96–112)
CO2: 28 mmol/L (ref 19–32)
Calcium: 9.5 mg/dL (ref 8.4–10.5)
Creatinine, Ser: 0.76 mg/dL (ref 0.50–1.35)
GFR calc Af Amer: >90 mL/min (ref >90)
Glucose, Bld: 117 mg/dL — ABNORMAL HIGH (ref 70–99)
POTASSIUM: 3.9 mmol/L (ref 3.5–5.1)
Sodium: 138 mmol/L (ref 135–145)
Total Protein: 7.3 g/dL (ref 6.0–8.3)

## 2014-01-20 LAB — LIPASE, BLOOD: Lipase: 39 U/L (ref 11–59)

## 2014-01-20 MED ORDER — FENTANYL CITRATE 0.05 MG/ML IJ SOLN
100.0000 ug | Freq: Once | INTRAMUSCULAR | Status: AC
  Administered 2014-01-20: 100 ug via INTRAVENOUS
  Filled 2014-01-20: qty 2

## 2014-01-20 MED ORDER — PROMETHAZINE HCL 25 MG/ML IJ SOLN
25.0000 mg | Freq: Once | INTRAMUSCULAR | Status: AC
  Administered 2014-01-20: 25 mg via INTRAVENOUS
  Filled 2014-01-20: qty 1

## 2014-01-20 MED ORDER — PROMETHAZINE HCL 25 MG PO TABS: 25.0000 mg | ORAL_TABLET | Freq: Four times a day (QID) | ORAL | Status: DC | PRN

## 2014-01-20 MED ORDER — OXYCODONE HCL 5 MG PO TABS: 5.0000 mg | ORAL_TABLET | ORAL | Status: DC | PRN

## 2014-01-20 MED ORDER — PANTOPRAZOLE SODIUM 40 MG IV SOLR
40.0000 mg | Freq: Once | INTRAVENOUS | Status: AC
  Administered 2014-01-20: 40 mg via INTRAVENOUS
  Filled 2014-01-20: qty 40

## 2014-01-20 MED ORDER — HYDROMORPHONE HCL 1 MG/ML IJ SOLN
1.0000 mg | Freq: Once | INTRAMUSCULAR | Status: AC
  Administered 2014-01-20: 1 mg via INTRAVENOUS
  Filled 2014-01-20: qty 1

## 2014-01-20 MED ORDER — IPRATROPIUM-ALBUTEROL 0.5-2.5 (3) MG/3ML IN SOLN
3.0000 mL | Freq: Once | RESPIRATORY_TRACT | Status: DC
  Filled 2014-01-20: qty 3

## 2014-01-20 MED ORDER — PANTOPRAZOLE SODIUM 20 MG PO TBEC: 20.0000 mg | DELAYED_RELEASE_TABLET | Freq: Every day | ORAL | Status: DC

## 2014-01-20 MED ORDER — KETOROLAC TROMETHAMINE 30 MG/ML IJ SOLN
15.0000 mg | Freq: Once | INTRAMUSCULAR | Status: AC
  Administered 2014-01-20: 15 mg via INTRAVENOUS
  Filled 2014-01-20: qty 1

## 2014-01-20 NOTE — Discharge Instructions (Signed)
Flank Pain °Flank pain refers to pain that is located on the side of the body between the upper abdomen and the back. The pain may occur over a short period of time (acute) or may be long-term or reoccurring (chronic). It may be mild or severe. Flank pain can be caused by many things. °CAUSES  °Some of the more common causes of flank pain include: °· Muscle strains.   °· Muscle spasms.   °· A disease of your spine (vertebral disk disease).   °· A lung infection (pneumonia).   °· Fluid around your lungs (pulmonary edema).   °· A kidney infection.   °· Kidney stones.   °· A very painful skin rash caused by the chickenpox virus (shingles).   °· Gallbladder disease.   °HOME CARE INSTRUCTIONS  °Home care will depend on the cause of your pain. In general, °· Rest as directed by your caregiver. °· Drink enough fluids to keep your urine clear or pale yellow. °· Only take over-the-counter or prescription medicines as directed by your caregiver. Some medicines may help relieve the pain. °· Tell your caregiver about any changes in your pain. °· Follow up with your caregiver as directed. °SEEK IMMEDIATE MEDICAL CARE IF:  °· Your pain is not controlled with medicine.   °· You have new or worsening symptoms. °· Your pain increases.   °· You have abdominal pain.   °· You have shortness of breath.   °· You have persistent nausea or vomiting.   °· You have swelling in your abdomen.   °· You feel faint or pass out.   °· You have blood in your urine. °· You have a fever or persistent symptoms for more than 2-3 days. °· You have a fever and your symptoms suddenly get worse. °MAKE SURE YOU:  °· Understand these instructions. °· Will watch your condition. °· Will get help right away if you are not doing well or get worse. °Document Released: 02/25/2005 Document Revised: 09/29/2011 Document Reviewed: 08/19/2011 °[employer]® Patient Information ©2015 [employer]. This information is not intended to replace advice given to you by your  health care provider. Make sure you discuss any questions you have with your health care provider. ° °

## 2014-01-20 NOTE — ED Provider Notes (Signed)
CSN: [id number]     Arrival date & time 01/20/14  1842 History  This chart was scribed for Michael Shi, MD by Milly Jakob, ED Scribe. The patient was seen in room MH02/MH02. Patient's care was started at 8:20 PM.   Chief Complaint  Patient presents with  . Abdominal Pain   The history is provided by the patient. No language interpreter was used.   HPI Comments: Michael Carroll is a 54 y.o. male who presents to the Emergency Department complaining of constant, severe, sharp, gradually worsening, right sided abdominal pain which began 4 days ago. He states that the pain radiates to his right side, and he reports associated nausea and vomiting. He states that the bumps in the road on the way here exacerbated his pain. He reports a history of kidney stones and kidney stent placement. He states that the last time he was seen here he was diagnosed with pancreatitis instead of kidney stones, but that this pain is significantly worse. He states that after the last time he was here the pain went away after about 1 week with treatment, and that he did not follow up with a doctor due to his insurance situation. He states that he does not have a PCP, but he is working on getting one. He states that he had is gallbladder removed in 2014, and he was recently admitted in a different city with an enlarged spleen. He does not drink alcohol.   Past Medical History  Diagnosis Date  . Cirrhosis of liver without mention of alcohol     PER LIVER BX 03/2012  . Right ureteral stone   . History of kidney stones   . Type 2 diabetes, diet controlled   . History of migraine   . Hypertension     NO MEDS  . Smokers' cough   . Productive cough   . At risk for sleep apnea     STOP-BANG=  4     . Headache(784.0)     migraines   Past Surgical History  Procedure Laterality Date  . Cholecystectomy N/A 03/26/2012    Procedure: LAPAROSCOPIC CHOLECYSTECTOMY;  Surgeon: Romie Levee, MD;  Location: [facility];  Service:  General;  Laterality: N/A;  . Liver biopsy N/A 03/26/2012    Procedure: LIVER BIOPSY;  Surgeon: Romie Levee, MD;  Location: [facility];  Service: General;  Laterality: N/A;  . Cystoscopy with retrograde pyelogram, ureteroscopy and stent placement Right 05/15/2013    Procedure: CYSTOSCOPY WITH RETROGRADE PYELOGRAM, URETEROSCOPY AND STENT PLACEMENT;  Surgeon: Crist Fat, MD;  Location: [facility];  Service: Urology;  Laterality: Right;  . Cysto/  right ureteral stent placement  06/2006  . Cysto/   right ureteral stent exchange  12/2007  . Extracorporeal shock wave lithotripsy    . Cardiovascular stress test  2004    CARDIOLITE STUDY NORMAL/  EF 60%  . Cystoscopy with retrograde pyelogram, ureteroscopy and stent placement Right 05/30/2013    Procedure: CYSTOSCOPY WITH RIGHT RETROGRADE PYELOGRAM, URETEROSCOPY LASER LITHOTRIPSY AND STENT EXCHANGE ;  Surgeon: Crist Fat, MD;  Location: [facility];  Service: Urology;  Laterality: Right;  . Holmium laser application Right 05/30/2013    Procedure: HOLMIUM LASER APPLICATION;  Surgeon: Crist Fat, MD;  Location: [facility];  Service: Urology;  Laterality: Right;  . Cystoscopy  07/11/2013    Procedure: CYSTOSCOPY RIGHT STENT REMOVAL;  Surgeon: Crist Fat, MD;  Location: [facility];  Service: Urology;;  Family History  Problem Relation Age of Onset  . Cancer - Lung Mother   . Cancer Mother   . Seizures Sister   . Heart disease Father    History  Substance Use Topics  . Smoking status: Current Every Day Smoker -- 1.50 packs/day for 25 years    Types: Cigarettes  . Smokeless tobacco: Never Used  . Alcohol Use: No    Review of Systems  Gastrointestinal: Positive for nausea, vomiting and abdominal pain.  Genitourinary: Positive for flank pain.  All other systems reviewed and are negative.  Allergies  Zofran and Compazine  Home Medications   Prior to Admission medications   Medication Sig  Start Date End Date Taking? Authorizing Provider  aspirin 81 MG tablet Take 81 mg by mouth every evening.     Historical Provider, MD  oxyCODONE (OXY IR/ROXICODONE) 5 MG immediate release tablet Take 1 tablet (5 mg total) by mouth every 4 (four) hours as needed for severe pain. 01/20/14   Michael Shi, MD  pantoprazole (PROTONIX) 20 MG tablet Take 1 tablet (20 mg total) by mouth daily. 01/20/14   Michael Shi, MD  phenazopyridine (PYRIDIUM) 200 MG tablet Take 1 tablet (200 mg total) by mouth 3 (three) times daily as needed for pain. 05/30/13   Crist Fat, MD  promethazine (PHENERGAN) 25 MG tablet Take 1 tablet (25 mg total) by mouth every 6 (six) hours as needed for nausea or vomiting. 01/20/14   Michael Shi, MD  tamsulosin (FLOMAX) 0.4 MG CAPS capsule Take 1 capsule (0.4 mg total) by mouth daily. 05/30/13   Crist Fat, MD  tetrahydrozoline-zinc (VISINE-AC) 0.05-0.25 % ophthalmic solution Place 1 drop into both eyes daily as needed (for dry eyes).    Historical Provider, MD   Triage Vitals: BP 165/99 mmHg  Pulse 86  Temp(Src) 98.6 F (37 C) (Oral)  Resp 18  Ht 6' (1.829 m)  Wt 250 lb (113.399 kg)  BMI 33.90 kg/m2  SpO2 100% Physical Exam  Constitutional: He is oriented to person, place, and time. He appears well-developed and well-nourished. No distress.  HENT:  Head: Normocephalic and atraumatic.  Eyes: Pupils are equal, round, and reactive to light.  Neck: Normal range of motion.  Cardiovascular: Normal rate and intact distal pulses.   Pulmonary/Chest: No respiratory distress.  Abdominal: Soft. Normal appearance and bowel sounds are normal. He exhibits no distension and no mass. There is tenderness. There is no rebound and no guarding.  Musculoskeletal: Normal range of motion.  Neurological: He is alert and oriented to person, place, and time. No cranial nerve deficit.  Skin: Skin is warm and dry. No rash noted.  Psychiatric: He has a normal mood and affect. His  behavior is normal.  Nursing note and vitals reviewed.   ED Course  Procedures (including critical care time)  Medications  HYDROmorphone (DILAUDID) injection 1 mg (1 mg Intravenous Given 01/20/14 2043)  promethazine (PHENERGAN) injection 25 mg (25 mg Intravenous Given 01/20/14 2043)  ketorolac (TORADOL) 30 MG/ML injection 15 mg (15 mg Intravenous Given 01/20/14 2221)  fentaNYL (SUBLIMAZE) injection 100 mcg (100 mcg Intravenous Given 01/20/14 2353)  pantoprazole (PROTONIX) injection 40 mg (40 mg Intravenous Given 01/20/14 2352)    DIAGNOSTIC STUDIES: Oxygen Saturation is 100% on room air, normal by my interpretation.    COORDINATION OF CARE: 8:30 PM-Discussed treatment plan which includes lab work, UA, pain medication, and nausea medication with pt at bedside and pt agreed to plan. *  Labs Review Labs Reviewed  URINALYSIS, ROUTINE W REFLEX MICROSCOPIC - Abnormal; Notable for the following:    Urobilinogen, UA 4.0 (*)    All other components within normal limits  CBC WITH DIFFERENTIAL - Abnormal; Notable for the following:    Hemoglobin 17.1 (*)    Platelets 108 (*)    All other components within normal limits  COMPREHENSIVE METABOLIC PANEL - Abnormal; Notable for the following:    Glucose, Bld 117 (*)    AST 54 (*)    ALT 75 (*)    All other components within normal limits  LIPASE, BLOOD   Imaging Review CLINICAL DATA: Right-sided abdominal pain beginning 4 days ago. Pain radiates to the right side with associated nausea and vomiting. Previous history of kidney stones and renal stent placement.  EXAM: CT ABDOMEN AND PELVIS WITHOUT CONTRAST  TECHNIQUE: Multidetector CT imaging of the abdomen and pelvis was performed following the standard protocol without IV contrast.  COMPARISON: 12/15/2013  FINDINGS: The lung bases are clear.  Mild right hydronephrosis is again demonstrated to the level of the ureteropelvic junction. Mild infiltration around this area is demonstrated.  No obstructing stone or lesion identified. The appearance is similar to previous study. This could represent residual inflammatory stricture or obstruction due to on occult or recently passed stone. No ureterectasis or ureteral stones identified. Left kidney and ureter are normal. No bladder stone or bladder wall thickening.  Surgical absence of the gallbladder. The unenhanced appearance of the liver, spleen, pancreas, adrenal glands, abdominal aorta, inferior vena cava, and retroperitoneal lymph nodes is unremarkable. Small accessory spleen. Stomach and small bowel are decompressed. Diffusely stool-filled colon without abnormal distention. No free air or free fluid in the abdomen.  Pelvis: Appendix is normal. Prostate gland is not enlarged. No free or loculated pelvic fluid collections. No pelvic mass or lymphadenopathy. Normal alignment of the lumbar spine. No destructive bone lesions appreciated.  IMPRESSION: Mild hydronephrosis of the right kidney to the level of the ureteropelvic junction. No obstructing stone or focal lesion is identified. This is unchanged since prior study and may be due to stricture although occult stone or recently passed stone could also have this appearance.   Electronically Signed By: Burman Nieves M.D. On: 01/20/2014 22:44   Patient turned over to Dr. Fredderick Phenix.   MDM   Final diagnoses:  Flank pain    I personally performed the services described in this documentation, which was scribed in my presence. The recorded information has been reviewed and considered.   Michael Shi, MD 02/01/14 [phone]

## 2014-01-20 NOTE — ED Notes (Signed)
Right flank pain that radiates around his side

## 2014-01-21 NOTE — ED Provider Notes (Signed)
Pt's pain is controlled.  Discharged home with Dr. Corky Sing discharge papers/prescriptions.  Rolan Bucco, MD 01/21/14 [phone]

## 2014-01-22 ENCOUNTER — Encounter (HOSPITAL_BASED_OUTPATIENT_CLINIC_OR_DEPARTMENT_OTHER): Payer: Self-pay | Admitting: Emergency Medicine

## 2014-01-22 ENCOUNTER — Emergency Department (HOSPITAL_BASED_OUTPATIENT_CLINIC_OR_DEPARTMENT_OTHER)
Admission: EM | Admit: 2014-01-22 | Discharge: 2014-01-22 | Disposition: A | Discharge status: Home / Self Care | Payer: Medicaid Other | Attending: Emergency Medicine | Admitting: Emergency Medicine

## 2014-01-22 DIAGNOSIS — S6981XA Other specified injuries of right wrist, hand and finger(s), initial encounter: Secondary | ICD-10-CM

## 2014-01-22 DIAGNOSIS — E119 Type 2 diabetes mellitus without complications: Secondary | ICD-10-CM | POA: Insufficient documentation

## 2014-01-22 DIAGNOSIS — Y9259 Other trade areas as the place of occurrence of the external cause: Secondary | ICD-10-CM | POA: Insufficient documentation

## 2014-01-22 DIAGNOSIS — S61230A Puncture wound without foreign body of right index finger without damage to nail, initial encounter: Secondary | ICD-10-CM | POA: Insufficient documentation

## 2014-01-22 DIAGNOSIS — Z8709 Personal history of other diseases of the respiratory system: Secondary | ICD-10-CM | POA: Insufficient documentation

## 2014-01-22 DIAGNOSIS — Z79899 Other long term (current) drug therapy: Secondary | ICD-10-CM | POA: Insufficient documentation

## 2014-01-22 DIAGNOSIS — Z7982 Long term (current) use of aspirin: Secondary | ICD-10-CM | POA: Insufficient documentation

## 2014-01-22 DIAGNOSIS — Z8719 Personal history of other diseases of the digestive system: Secondary | ICD-10-CM | POA: Insufficient documentation

## 2014-01-22 DIAGNOSIS — I10 Essential (primary) hypertension: Secondary | ICD-10-CM | POA: Insufficient documentation

## 2014-01-22 DIAGNOSIS — Z9889 Other specified postprocedural states: Secondary | ICD-10-CM | POA: Insufficient documentation

## 2014-01-22 DIAGNOSIS — Z720 Tobacco use: Secondary | ICD-10-CM | POA: Insufficient documentation

## 2014-01-22 DIAGNOSIS — Y9389 Activity, other specified: Secondary | ICD-10-CM | POA: Insufficient documentation

## 2014-01-22 DIAGNOSIS — Y990 Civilian activity done for income or pay: Secondary | ICD-10-CM | POA: Insufficient documentation

## 2014-01-22 DIAGNOSIS — IMO0001 Reserved for inherently not codable concepts without codable children: Secondary | ICD-10-CM

## 2014-01-22 DIAGNOSIS — W460XXA Contact with hypodermic needle, initial encounter: Secondary | ICD-10-CM | POA: Insufficient documentation

## 2014-01-22 DIAGNOSIS — Z87442 Personal history of urinary calculi: Secondary | ICD-10-CM | POA: Insufficient documentation

## 2014-01-22 LAB — HEPATITIS PANEL, ACUTE
HCV Ab: REACTIVE — AB
HEP B C IGM: NONREACTIVE
Hep A IgM: NONREACTIVE
Hepatitis B Surface Ag: NEGATIVE

## 2014-01-22 LAB — RAPID HIV SCREEN (WH-MAU): Rapid HIV Screen: NONREACTIVE

## 2014-01-22 NOTE — ED Notes (Signed)
Pt cleaning a garbage can in office and was stuck by hypodermic needle. Pt says "it went all the way through." Pt washed out with water and used alcohol swab. Unsure of who's needle it was.

## 2014-01-22 NOTE — Discharge Instructions (Signed)
Needle Stick Injury  A needle stick injury occurs when you are stuck by a needle that may have the blood from another person on it. Most of the time these injuries heal without any problem, but several diseases can be transmitted this way. You should be aware of the risks. A needle stick injury can cause risk for getting:  · Hepatitis B.  · Hepatitis C.  · HIV infection (the virus that causes AIDS).  The chance of getting one of these infections from a needle stick injury is small. However, it is important to take proper precautions to prevent such an injury. It is also important to understand and follow some health care recommendations when such an injury occurs.   RISK FACTORS  In general, the risk of infection after a needle stick injury appears to be higher with:  · Exposure to a needle that is visibly contaminated with blood.  · Exposure to the blood of a patient with an advanced disease or with a high viral load.  · A deep injury.  · Needle placement in a vein or artery.  PREVENTION   All health care workers should:  · Wash their hands often, including before and after caring for each patient.  · Receive the hepatitis B vaccine before any possible exposure to blood or bloody body fluids.  · Use personal protective equipment (PPE) when appropriate. This includes:  ¨ Gloves.  ¨ Gowns.  ¨ Boots.  ¨ Shoe covers.  ¨ Eyewear.  ¨ Masks.  · Wear gloves when any kind of venous or arterial access is being done.  · Use safety devices when available.  · Use sharp edges and needles with caution.  · Dispose of used needles and other sharps in puncture proof receptacles.  · Never recap needles.  TREATMENT   · After a needle stick injury, immediate cleansing with soap and water or an alcohol-based hand hygiene agent is needed.  · If you did not have a tetanus booster within the past 10 years, a booster shot should be given.  · If the puncture site becomes red, swollen, more painful, or drains yellowish-white fluid (pus),  medicine for a bacterial infection may be needed.  · If the blood on the needle is known or thought to be high risk for hepatitis B or HIV, additional treatment is needed.  · For needle stick exposures to the HIV virus, drug treatment is advised. This treatment is called post-exposure prophylaxis (PEP) and should be started as soon as possible following the injury. The recommended period of treatment with medicines is usually 4 weeks with 2 or more different drugs. You should have follow-up counseling and a medical evaluation, including HIV blood tests, right away. The tests should be repeated at 6 weeks, 3 months, and 6 months. Blood tests to monitor for drug toxicity effects of the PEP medicines are usually recommended immediately before treatment starts and again at 2 weeks and 4 weeks after the start of PEP. Additional recommendations during the first 6 to 12 weeks after exposure include:  ¨ Practicing sexual abstinence or using condoms to prevent sexual transmission and to avoid pregnancy.  ¨ Refraining from donating blood, plasma, semen, organs, or other tissue.  ¨ For breastfeeding women, considering temporary discontinuation of breastfeeding while on PEP.  · For needle stick exposures to hepatitis B, blood testing and PEP is also needed. If you have not been vaccinated against hepatitis B, this vaccine series should be started and hepatitis B immune   globulin should also be given. If you have been previously vaccinated, your status of immunity to infection with hepatitis B can be tested by a blood antibody test. Before or after those test results are available, repeat vaccination with or without hepatitis B immune globulin will be considered.  · Unfortunately, no helpful treatment following hepatitis C exposure has been identified or is recommended. Follow-up blood testing is advised over a period of 4 weeks to 6 months to determine if the needle stick led to an infection. Ask your caregiver for advice about  this follow-up testing.  HOME CARE INSTRUCTIONS  · Take medicine exactly as told by your caregiver.  · Keep all follow-up appointments.  · Do not share personal hygiene items.  SEEK IMMEDIATE MEDICAL CARE IF:  · You have concerns about your injury, treatment, or follow-up.  · The injury site becomes red, swollen, or painful.  · The injury site drains pus.  MAKE SURE YOU:  · Understand these instructions.  · Will watch your condition.  · Will get help right away if you are not doing well or get worse.  Document Released: 01/04/2005 Document Revised: 05/21/2013 Document Reviewed: 06/09/2010  Exit[employer]® Patient Information ©2015 [employer]. This information is not intended to replace advice given to you by your health care provider. Make sure you discuss any questions you have with your health care provider.

## 2014-01-22 NOTE — ED Provider Notes (Signed)
CSN: [id number]     Arrival date & time 01/22/14  1143 History   First MD Initiated Contact with Patient 01/22/14 1206     Chief Complaint  Patient presents with  . Stuck by Needle       HPI Patient reports she was stuck by a needle that came through her bag when he was taking out the trash just prior to arrival.  This occurred while at work.  This is an unknown needle to him.  It struck him in his right index finger.  He washed it and cleaned it with alcohol.  He has no complaints at this time.  He is concerned about the possibility of spread of disease.   Past Medical History  Diagnosis Date  . Cirrhosis of liver without mention of alcohol     PER LIVER BX 03/2012  . Right ureteral stone   . History of kidney stones   . Type 2 diabetes, diet controlled   . History of migraine   . Hypertension     NO MEDS  . Smokers' cough   . Productive cough   . At risk for sleep apnea     STOP-BANG=  4     . Headache(784.0)     migraines   Past Surgical History  Procedure Laterality Date  . Cholecystectomy N/A 03/26/2012    Procedure: LAPAROSCOPIC CHOLECYSTECTOMY;  Surgeon: Romie LeveeAlicia Thomas, MD;  Location: [facility];  Service: General;  Laterality: N/A;  . Liver biopsy N/A 03/26/2012    Procedure: LIVER BIOPSY;  Surgeon: Romie LeveeAlicia Thomas, MD;  Location: [facility];  Service: General;  Laterality: N/A;  . Cystoscopy with retrograde pyelogram, ureteroscopy and stent placement Right 05/15/2013    Procedure: CYSTOSCOPY WITH RETROGRADE PYELOGRAM, URETEROSCOPY AND STENT PLACEMENT;  Surgeon: Crist FatBenjamin W Herrick, MD;  Location: [facility];  Service: Urology;  Laterality: Right;  . Cysto/  right ureteral stent placement  06/2006  . Cysto/   right ureteral stent exchange  12/2007  . Extracorporeal shock wave lithotripsy    . Cardiovascular stress test  2004    CARDIOLITE STUDY NORMAL/  EF 60%  . Cystoscopy with retrograde pyelogram, ureteroscopy and stent placement Right 05/30/2013    Procedure: CYSTOSCOPY WITH RIGHT  RETROGRADE PYELOGRAM, URETEROSCOPY LASER LITHOTRIPSY AND STENT EXCHANGE ;  Surgeon: Crist FatBenjamin W Herrick, MD;  [facility];  Service: Urology;  Laterality: Right;  . Holmium laser application Right 05/30/2013    Procedure: HOLMIUM LASER APPLICATION;  Surgeon: Crist FatBenjamin W Herrick, MD;  [facility];  Service: Urology;  Laterality: Right;  . Cystoscopy  07/11/2013    Procedure: CYSTOSCOPY RIGHT STENT REMOVAL;  Surgeon: Crist FatBenjamin W Herrick, MD;  Location: [facility];  Service: Urology;;   Family History  Problem Relation Age of Onset  . Cancer - Lung Mother   . Cancer Mother   . Seizures Sister   . Heart disease Father    History  Substance Use Topics  . Smoking status: Current Every Day Smoker -- 1.50 packs/day for 25 years    Types: Cigarettes  . Smokeless tobacco: Never Used  . Alcohol Use: No    Review of Systems  All other systems reviewed and are negative.     Allergies  Zofran and Compazine  Home Medications   Prior to Admission medications   Medication Sig Start Date End Date Taking? Authorizing Provider  aspirin 81 MG tablet Take 81 mg by mouth every evening.     Historical Provider,  MD  oxyCODONE (OXY IR/ROXICODONE) 5 MG immediate release tablet Take 1 tablet (5 mg total) by mouth every 4 (four) hours as needed for severe pain. 01/20/14   Nelia Shi, MD  pantoprazole (PROTONIX) 20 MG tablet Take 1 tablet (20 mg total) by mouth daily. 01/20/14   Nelia Shi, MD  phenazopyridine (PYRIDIUM) 200 MG tablet Take 1 tablet (200 mg total) by mouth 3 (three) times daily as needed for pain. 05/30/13   Crist Fat, MD  promethazine (PHENERGAN) 25 MG tablet Take 1 tablet (25 mg total) by mouth every 6 (six) hours as needed for nausea or vomiting. 01/20/14   Nelia Shi, MD  tamsulosin (FLOMAX) 0.4 MG CAPS capsule Take 1 capsule (0.4 mg total) by mouth daily. 05/30/13   Crist Fat, MD  tetrahydrozoline-zinc (VISINE-AC)  0.05-0.25 % ophthalmic solution Place 1 drop into both eyes daily as needed (for dry eyes).    Historical Provider, MD   BP 166/107 mmHg  Pulse 82  Temp(Src) 98.5 F (36.9 C) (Oral)  Resp 20  Ht 6' (1.829 m)  Wt 253 lb (114.76 kg)  BMI 34.31 kg/m2  SpO2 99% Physical Exam  Constitutional: He is oriented to person, place, and time. He appears well-developed and well-nourished.  HENT:  Head: Normocephalic.  Eyes: EOM are normal.  Neck: Normal range of motion.  Pulmonary/Chest: Effort normal.  Abdominal: He exhibits no distension.  Musculoskeletal: Normal range of motion.  Small puncture wound noted to the tip of his right index finger on the volar surface.  No active bleeding.  Neurological: He is alert and oriented to person, place, and time.  Psychiatric: He has a normal mood and affect.  Nursing note and vitals reviewed.   ED Course  Procedures (including critical care time) Labs Review Labs Reviewed - No data to display  Imaging Review    EKG Interpretation None      MDM   Final diagnoses:  None    Exposure panel performed.  Discharge home at this time.  Instructions to follow-up with his physician for repeat testing    Lyanne Co, MD 01/22/14 1212

## 2014-01-25 ENCOUNTER — Telehealth (HOSPITAL_COMMUNITY): Payer: Self-pay

## 2014-01-25 LAB — HCV RNA QUANT
HCV Quantitative Log: 6.38 {Log} — ABNORMAL HIGH (ref <1.18)
HCV Quantitative: 2381522 IU/mL — ABNORMAL HIGH (ref <15)

## 2014-01-25 NOTE — Telephone Encounter (Signed)
LVM @ 2nd (H) # [phone] requesting callback.

## 2014-01-26 ENCOUNTER — Telehealth (HOSPITAL_BASED_OUTPATIENT_CLINIC_OR_DEPARTMENT_OTHER): Payer: Self-pay | Admitting: Emergency Medicine

## 2014-01-28 ENCOUNTER — Telehealth (HOSPITAL_BASED_OUTPATIENT_CLINIC_OR_DEPARTMENT_OTHER): Payer: Self-pay | Admitting: *Deleted

## 2014-02-03 ENCOUNTER — Telehealth (HOSPITAL_BASED_OUTPATIENT_CLINIC_OR_DEPARTMENT_OTHER): Payer: Self-pay | Admitting: Emergency Medicine

## 2014-02-03 NOTE — Telephone Encounter (Signed)
Positive Hepatitis C result. Per Dr Blinda LeatherwoodPollina have patient follow-up with GI and PCP.  patient returned call, ID verified x three. Notified of + Hepatitis C result and need to follow-up with PCP and GI MD. Patient teaching given regarding positive result. Patient verbalized teaching and follow-up instructions.

## 2014-02-17 ENCOUNTER — Emergency Department (HOSPITAL_BASED_OUTPATIENT_CLINIC_OR_DEPARTMENT_OTHER): Payer: Medicaid Other

## 2014-02-17 ENCOUNTER — Encounter (HOSPITAL_BASED_OUTPATIENT_CLINIC_OR_DEPARTMENT_OTHER): Payer: Self-pay

## 2014-02-17 ENCOUNTER — Emergency Department (HOSPITAL_BASED_OUTPATIENT_CLINIC_OR_DEPARTMENT_OTHER)
Admission: EM | Admit: 2014-02-17 | Discharge: 2014-02-17 | Disposition: A | Discharge status: Home / Self Care | Payer: Medicaid Other | Attending: Emergency Medicine | Admitting: Emergency Medicine

## 2014-02-17 DIAGNOSIS — Z8679 Personal history of other diseases of the circulatory system: Secondary | ICD-10-CM | POA: Insufficient documentation

## 2014-02-17 DIAGNOSIS — R1011 Right upper quadrant pain: Secondary | ICD-10-CM

## 2014-02-17 DIAGNOSIS — E119 Type 2 diabetes mellitus without complications: Secondary | ICD-10-CM | POA: Insufficient documentation

## 2014-02-17 DIAGNOSIS — I10 Essential (primary) hypertension: Secondary | ICD-10-CM | POA: Insufficient documentation

## 2014-02-17 DIAGNOSIS — Z7982 Long term (current) use of aspirin: Secondary | ICD-10-CM | POA: Insufficient documentation

## 2014-02-17 DIAGNOSIS — Z720 Tobacco use: Secondary | ICD-10-CM | POA: Insufficient documentation

## 2014-02-17 DIAGNOSIS — R911 Solitary pulmonary nodule: Secondary | ICD-10-CM

## 2014-02-17 DIAGNOSIS — N1330 Unspecified hydronephrosis: Secondary | ICD-10-CM

## 2014-02-17 LAB — URINALYSIS, ROUTINE W REFLEX MICROSCOPIC
GLUCOSE, UA: NEGATIVE mg/dL
Hgb urine dipstick: NEGATIVE
KETONES UR: NEGATIVE mg/dL
Leukocytes, UA: NEGATIVE
NITRITE: NEGATIVE
PROTEIN: NEGATIVE mg/dL
Specific Gravity, Urine: 1.027 (ref 1.005–1.030)
Urobilinogen, UA: 2 mg/dL — ABNORMAL HIGH (ref 0.0–1.0)
pH: 6.5 (ref 5.0–8.0)

## 2014-02-17 LAB — CBC WITH DIFFERENTIAL/PLATELET
BASOS ABS: 0 10*3/uL (ref 0.0–0.1)
BASOS PCT: 0 % (ref 0–1)
EOS ABS: 0.1 10*3/uL (ref 0.0–0.7)
Eosinophils Relative: 2 % (ref 0–5)
HEMATOCRIT: 50.4 % (ref 39.0–52.0)
HEMOGLOBIN: 17.6 g/dL — AB (ref 13.0–17.0)
LYMPHS ABS: 2 10*3/uL (ref 0.7–4.0)
LYMPHS PCT: 25 % (ref 12–46)
MCH: 29.6 pg (ref 26.0–34.0)
MCHC: 34.9 g/dL (ref 30.0–36.0)
MCV: 84.7 fL (ref 78.0–100.0)
Monocytes Absolute: 0.5 10*3/uL (ref 0.1–1.0)
Monocytes Relative: 7 % (ref 3–12)
NEUTROS ABS: 5.1 10*3/uL (ref 1.7–7.7)
NEUTROS PCT: 66 % (ref 43–77)
Platelets: 98 10*3/uL — ABNORMAL LOW (ref 150–400)
RBC: 5.95 MIL/uL — ABNORMAL HIGH (ref 4.22–5.81)
RDW: 12.9 % (ref 11.5–15.5)
WBC: 7.8 10*3/uL (ref 4.0–10.5)

## 2014-02-17 LAB — COMPREHENSIVE METABOLIC PANEL
ALBUMIN: 3.9 g/dL (ref 3.5–5.2)
ALT: 79 U/L — ABNORMAL HIGH (ref 0–53)
AST: 56 U/L — ABNORMAL HIGH (ref 0–37)
Alkaline Phosphatase: 71 U/L (ref 39–117)
Anion gap: 3 — ABNORMAL LOW (ref 5–15)
BILIRUBIN TOTAL: 0.7 mg/dL (ref 0.3–1.2)
BUN: 18 mg/dL (ref 6–23)
CALCIUM: 8.8 mg/dL (ref 8.4–10.5)
CO2: 23 mmol/L (ref 19–32)
Chloride: 111 mmol/L (ref 96–112)
Creatinine, Ser: 0.86 mg/dL (ref 0.50–1.35)
GFR calc Af Amer: >90 mL/min (ref >90)
GFR calc non Af Amer: >90 mL/min (ref >90)
Glucose, Bld: 176 mg/dL — ABNORMAL HIGH (ref 70–99)
POTASSIUM: 3.8 mmol/L (ref 3.5–5.1)
Sodium: 137 mmol/L (ref 135–145)
Total Protein: 6.5 g/dL (ref 6.0–8.3)

## 2014-02-17 LAB — LIPASE, BLOOD: LIPASE: 67 U/L — AB (ref 11–59)

## 2014-02-17 MED ORDER — OXYCODONE HCL 5 MG PO TABS: ORAL_TABLET | ORAL | Status: DC

## 2014-02-17 MED ORDER — METOCLOPRAMIDE HCL 10 MG PO TABS: 10.0000 mg | ORAL_TABLET | Freq: Four times a day (QID) | ORAL | Status: DC

## 2014-02-17 MED ORDER — KETOROLAC TROMETHAMINE 15 MG/ML IJ SOLN: 15.0000 mg | Freq: Once | INTRAMUSCULAR | Status: AC

## 2014-02-17 MED ORDER — HYDROMORPHONE HCL 1 MG/ML IJ SOLN
0.5000 mg | Freq: Once | INTRAMUSCULAR | Status: AC
  Administered 2014-02-17: 0.5 mg via INTRAVENOUS
  Filled 2014-02-17: qty 1

## 2014-02-17 MED ORDER — SODIUM CHLORIDE 0.9 % IV SOLN
Freq: Once | INTRAVENOUS | Status: AC
  Administered 2014-02-17: 19:00:00 via INTRAVENOUS

## 2014-02-17 MED ORDER — METOCLOPRAMIDE HCL 5 MG/ML IJ SOLN
10.0000 mg | Freq: Once | INTRAMUSCULAR | Status: AC
  Administered 2014-02-17: 10 mg via INTRAVENOUS
  Filled 2014-02-17: qty 2

## 2014-02-17 MED ORDER — KETOROLAC TROMETHAMINE 30 MG/ML IJ SOLN
INTRAMUSCULAR | Status: AC
  Administered 2014-02-17: 15 mg
  Filled 2014-02-17: qty 1

## 2014-02-17 MED ORDER — MORPHINE SULFATE 4 MG/ML IJ SOLN
4.0000 mg | INTRAMUSCULAR | Status: DC | PRN
  Administered 2014-02-17: 4 mg via INTRAVENOUS
  Filled 2014-02-17: qty 1

## 2014-02-17 NOTE — ED Provider Notes (Signed)
CSN: [id number]     Arrival date & time 02/17/14  1753 History   First MD Initiated Contact with Patient 02/17/14 1835     Chief Complaint  Patient presents with  . Abdominal Pain     (Consider location/radiation/quality/duration/timing/severity/associated sxs/prior Treatment) HPI   Alisa Graffrnest P Ferrie is a 54 y.o. male complaining of  10/10 right upper quadrant abdominal pain associated with nausea no vomiting. Patient was recently diagnosed with hepatitis C, he is uninsured and has had issues trying to obtain outpatient care. He denies fever, chills, jaundice, abdominal pain, he does report slightly looser stool than normal. He is status post cholecystectomy in the remote past. States he does not drink alcohol. Patient is nonalcoholic fatty liver disease diagnosed  In 2014 at the time of cholecystectomy. Patient states he takes occasional Goody powder with some relief.  Past Medical History  Diagnosis Date  . Cirrhosis of liver without mention of alcohol     PER LIVER BX 03/2012  . Right ureteral stone   . History of kidney stones   . Type 2 diabetes, diet controlled   . History of migraine   . Hypertension     NO MEDS  . Smokers' cough   . Productive cough   . At risk for sleep apnea     STOP-BANG=  4     . Headache(784.0)     migraines   Past Surgical History  Procedure Laterality Date  . Cholecystectomy N/A 03/26/2012    Procedure: LAPAROSCOPIC CHOLECYSTECTOMY;  Surgeon: Romie LeveeAlicia Thomas, MD;  Location: [facility];  Service: General;  Laterality: N/A;  . Liver biopsy N/A 03/26/2012    Procedure: LIVER BIOPSY;  Surgeon: Romie LeveeAlicia Thomas, MD;  Location: [facility];  Service: General;  Laterality: N/A;  . Cystoscopy with retrograde pyelogram, ureteroscopy and stent placement Right 05/15/2013    Procedure: CYSTOSCOPY WITH RETROGRADE PYELOGRAM, URETEROSCOPY AND STENT PLACEMENT;  Surgeon: Crist FatBenjamin W Herrick, MD;  Location: [facility];  Service: Urology;  Laterality: Right;  . Cysto/  right ureteral  stent placement  06/2006  . Cysto/   right ureteral stent exchange  12/2007  . Extracorporeal shock wave lithotripsy    . Cardiovascular stress test  2004    CARDIOLITE STUDY NORMAL/  EF 60%  . Cystoscopy with retrograde pyelogram, ureteroscopy and stent placement Right 05/30/2013    Procedure: CYSTOSCOPY WITH RIGHT RETROGRADE PYELOGRAM, URETEROSCOPY LASER LITHOTRIPSY AND STENT EXCHANGE ;  Surgeon: Crist FatBenjamin W Herrick, MD;  [facility] [place];  Service: Urology;  Laterality: Right;  . Holmium laser application Right 05/30/2013    Procedure: HOLMIUM LASER APPLICATION;  Surgeon: Crist FatBenjamin W Herrick, MD;  [facility] [place];  Service: Urology;  Laterality: Right;  . Cystoscopy  07/11/2013    Procedure: CYSTOSCOPY RIGHT STENT REMOVAL;  Surgeon: Crist FatBenjamin W Herrick, MD;  Location: [facility];  Service: Urology;;   Family History  Problem Relation Age of Onset  . Cancer - Lung Mother   . Cancer Mother   . Seizures Sister   . Heart disease Father    History  Substance Use Topics  . Smoking status: Current Every Day Smoker -- 1.50 packs/day for 25 years    Types: Cigarettes  . Smokeless tobacco: Never Used  . Alcohol Use: No    Review of Systems  10 systems reviewed and found to be negative, except as noted in the HPI.   Allergies  Zofran and Compazine  Home Medications   Prior to Admission medications  Medication Sig Start Date End Date Taking? Authorizing Provider  aspirin 81 MG tablet Take 81 mg by mouth every evening.    Yes Historical Provider, MD   BP 198/109 mmHg  Pulse 89  Temp(Src) 98.3 F (36.8 C) (Oral)  Resp 25  SpO2 96% Physical Exam  Constitutional: He is oriented to person, place, and time. He appears well-developed and well-nourished. No distress.  Obese  HENT:  Head: Normocephalic and atraumatic.  Mouth/Throat: Oropharynx is clear and moist.  Eyes: Conjunctivae and EOM are normal. Pupils are equal, round, and reactive to light.   Neck: Normal range of motion.  Cardiovascular: Normal rate, regular rhythm and intact distal pulses.   Pulmonary/Chest: Effort normal and breath sounds normal. No stridor.  Abdominal: Soft. Bowel sounds are normal. He exhibits no distension and no mass. There is tenderness. There is no rebound and no guarding.  To palpation the right upper quadrant with no guarding or rebound.  Musculoskeletal: Normal range of motion.  Neurological: He is alert and oriented to person, place, and time.  Psychiatric: He has a normal mood and affect.  Nursing note and vitals reviewed.   ED Course  Procedures (including critical care time) Labs Review Labs Reviewed  URINALYSIS, ROUTINE W REFLEX MICROSCOPIC - Abnormal; Notable for the following:    Color, Urine AMBER (*)    Bilirubin Urine SMALL (*)    Urobilinogen, UA 2.0 (*)    All other components within normal limits  CBC WITH DIFFERENTIAL/PLATELET  COMPREHENSIVE METABOLIC PANEL  LIPASE, BLOOD    Imaging Review No results found.   EKG Interpretation None      MDM   Final diagnoses:  RUQ pain  Hydronephrosis, right  Pulmonary nodule    Filed Vitals:   02/17/14 1944 02/17/14 2000 02/17/14 2023 02/17/14 2030  BP: 123/75 134/70  136/71  Pulse: 70 66  64  Temp:      TempSrc:      Resp: Height:   6' (1.829 m)   Weight:   260 lb (117.935 kg)   SpO2: 97% 97%  96%    Medications  morphine 4 MG/ML injection 4 mg (4 mg Intravenous Given 02/17/14 1900)  0.9 %  sodium chloride infusion ( Intravenous New Bag/Given 02/17/14 1857)  metoCLOPramide (REGLAN) injection 10 mg (10 mg Intravenous Given 02/17/14 1903)  ketorolac (TORADOL) 15 MG/ML injection 15 mg (0 mg Intravenous Duplicate 02/17/14 2023)  ketorolac (TORADOL) 30 MG/ML injection (15 mg  Given 02/17/14 2022)    JULE SCHLABACH is a pleasant 54 y.o. male presenting with abdominal pain in the right upper quadrant associated with nausea and no vomiting. Patient is afebrile,  well-appearing and well-hydrated. He was recently diagnosed with hep C and has not been able to secure treatment for that up to this date. I will refer him to gastroenterology. Abdominal exam is benign with slight tenderness palpation the right upper quadrant. LFTs and lipase are slightly elevated. He has no white count. Ultrasound shows a right hydronephrosis, patient has had history of multiple kidney stones which have required stent, CT is ordered which shows resolution of hydronephrosis there is a perinephric  stranding. Urinalysis is not consistent with infection, I doubt pyelonephritis. They also noted a small left lung nodule. Patient will be sent home with instructions to recheck the nodule, gastroenterology referral for management of his hepatitis C, will write him for oxycodone for pain control  Evaluation does not show pathology that would require ongoing  emergent intervention or inpatient treatment. Pt is hemodynamically stable and mentating appropriately. Discussed findings and plan with patient/guardian, who agrees with care plan. All questions answered. Return precautions discussed and outpatient follow up given.   New Prescriptions   METOCLOPRAMIDE (REGLAN) 10 MG TABLET    Take 1 tablet (10 mg total) by mouth every 6 (six) hours.   OXYCODONE (ROXICODONE) 5 MG IMMEDIATE RELEASE TABLET    Take 1-2 tablets every 4-6 hours as needed for pain control         Wynetta Emery, PA-C 02/18/14 1551  Candyce Churn III, MD 02/21/14 [phone]

## 2014-02-17 NOTE — Discharge Instructions (Signed)
For pain control please take ibuprofen (also known as Motrin or Advil) 800mg  (this is normally 4 over the counter pills) 3 times a day  for 5 days. Take with food to minimize stomach irritation.  Take oxycodone for breakthrough pain, do not drink alcohol, drive, care for children or do other critical tasks while taking oxycodone.  Your imaging today shows nodules in your lungs. Your primary care doctor must be made aware of this and further imaging may be ordered. Please make them aware that they will need to obtain records for further management.  Do not hesitate to return to the Emergency Department for any new, worsening or concerning symptoms.   If you do not have a primary care doctor you can establish one at the   [facility]: [address] [place] [id number] [phone]  After you establish care. Let them know you were seen in the emergency room. They must obtain records for further management.    Abdominal Pain Many things can cause abdominal pain. Usually, abdominal pain is not caused by a disease and will improve without treatment. It can often be observed and treated at home. Your health care provider will do a physical exam and possibly order blood tests and X-rays to help determine the seriousness of your pain. However, in many cases, more time must pass before a clear cause of the pain can be found. Before that point, your health care provider may not know if you need more testing or further treatment. HOME CARE INSTRUCTIONS  Monitor your abdominal pain for any changes. The following actions may help to alleviate any discomfort you are experiencing:  Only take over-the-counter or prescription medicines as directed by your health care provider.  Do not take laxatives unless directed to do so by your health care provider.  Try a clear liquid diet (broth, tea, or water) as directed by your health care provider. Slowly move to a bland diet as tolerated. SEEK  MEDICAL CARE IF:  You have unexplained abdominal pain.  You have abdominal pain associated with nausea or diarrhea.  You have pain when you urinate or have a bowel movement.  You experience abdominal pain that wakes you in the night.  You have abdominal pain that is worsened or improved by eating food.  You have abdominal pain that is worsened with eating fatty foods.  You have a fever. SEEK IMMEDIATE MEDICAL CARE IF:   Your pain does not go away within 2 hours.  You keep throwing up (vomiting).  Your pain is felt only in portions of the abdomen, such as the right side or the left lower portion of the abdomen.  You pass bloody or black tarry stools. MAKE SURE YOU:  Understand these instructions.   Will watch your condition.   Will get help right away if you are not doing well or get worse.  Document Released: 10/14/2004 Document Revised: 01/09/2013 Document Reviewed: 09/13/2012 [facility] Patient Information 2015 [place] [place]. This information is not intended to replace advice given to you by your health care provider. Make sure you discuss any questions you have with your health care provider.

## 2014-02-17 NOTE — ED Notes (Signed)
Patient here with persistent right sided abdominal pain since Wednesday, nausea with same, reports loose stool and no relief with phenergan. Diagnosed with hepatitis c 2 weeks ago and has not followed up.

## 2014-03-10 ENCOUNTER — Encounter (HOSPITAL_BASED_OUTPATIENT_CLINIC_OR_DEPARTMENT_OTHER): Payer: Self-pay | Admitting: *Deleted

## 2014-03-10 ENCOUNTER — Emergency Department (HOSPITAL_BASED_OUTPATIENT_CLINIC_OR_DEPARTMENT_OTHER)
Admission: EM | Admit: 2014-03-10 | Discharge: 2014-03-10 | Disposition: A | Discharge status: Home / Self Care | Payer: Medicaid Other | Attending: Emergency Medicine | Admitting: Emergency Medicine

## 2014-03-10 DIAGNOSIS — G8929 Other chronic pain: Secondary | ICD-10-CM

## 2014-03-10 DIAGNOSIS — R7989 Other specified abnormal findings of blood chemistry: Secondary | ICD-10-CM | POA: Insufficient documentation

## 2014-03-10 DIAGNOSIS — Z7982 Long term (current) use of aspirin: Secondary | ICD-10-CM | POA: Insufficient documentation

## 2014-03-10 DIAGNOSIS — Z9189 Other specified personal risk factors, not elsewhere classified: Secondary | ICD-10-CM | POA: Insufficient documentation

## 2014-03-10 DIAGNOSIS — E119 Type 2 diabetes mellitus without complications: Secondary | ICD-10-CM | POA: Insufficient documentation

## 2014-03-10 DIAGNOSIS — Z720 Tobacco use: Secondary | ICD-10-CM | POA: Insufficient documentation

## 2014-03-10 DIAGNOSIS — I10 Essential (primary) hypertension: Secondary | ICD-10-CM | POA: Insufficient documentation

## 2014-03-10 DIAGNOSIS — R1011 Right upper quadrant pain: Secondary | ICD-10-CM | POA: Insufficient documentation

## 2014-03-10 DIAGNOSIS — Z87442 Personal history of urinary calculi: Secondary | ICD-10-CM | POA: Insufficient documentation

## 2014-03-10 DIAGNOSIS — Z8719 Personal history of other diseases of the digestive system: Secondary | ICD-10-CM

## 2014-03-10 DIAGNOSIS — R110 Nausea: Secondary | ICD-10-CM | POA: Insufficient documentation

## 2014-03-10 DIAGNOSIS — R7401 Elevation of levels of liver transaminase levels: Secondary | ICD-10-CM

## 2014-03-10 DIAGNOSIS — Z8709 Personal history of other diseases of the respiratory system: Secondary | ICD-10-CM | POA: Insufficient documentation

## 2014-03-10 DIAGNOSIS — R740 Nonspecific elevation of levels of transaminase and lactic acid dehydrogenase [LDH]: Secondary | ICD-10-CM

## 2014-03-10 LAB — COMPREHENSIVE METABOLIC PANEL
ALT: 59 U/L — ABNORMAL HIGH (ref 0–53)
AST: 54 U/L — AB (ref 0–37)
Albumin: 4 g/dL (ref 3.5–5.2)
Alkaline Phosphatase: 70 U/L (ref 39–117)
Anion gap: 2 — ABNORMAL LOW (ref 5–15)
BUN: 24 mg/dL — AB (ref 6–23)
CALCIUM: 9.3 mg/dL (ref 8.4–10.5)
CHLORIDE: 106 mmol/L (ref 96–112)
CO2: 30 mmol/L (ref 19–32)
Creatinine, Ser: 0.94 mg/dL (ref 0.50–1.35)
Glucose, Bld: 112 mg/dL — ABNORMAL HIGH (ref 70–99)
POTASSIUM: 4.1 mmol/L (ref 3.5–5.1)
SODIUM: 138 mmol/L (ref 135–145)
TOTAL PROTEIN: 7.1 g/dL (ref 6.0–8.3)
Total Bilirubin: 0.6 mg/dL (ref 0.3–1.2)

## 2014-03-10 LAB — CBC WITH DIFFERENTIAL/PLATELET
BASOS ABS: 0.1 10*3/uL (ref 0.0–0.1)
Basophils Relative: 1 % (ref 0–1)
Eosinophils Absolute: 0.3 10*3/uL (ref 0.0–0.7)
Eosinophils Relative: 4 % (ref 0–5)
HCT: 49.1 % (ref 39.0–52.0)
HEMOGLOBIN: 16.9 g/dL (ref 13.0–17.0)
Lymphocytes Relative: 36 % (ref 12–46)
Lymphs Abs: 2.6 10*3/uL (ref 0.7–4.0)
MCH: 29.5 pg (ref 26.0–34.0)
MCHC: 34.4 g/dL (ref 30.0–36.0)
MCV: 85.8 fL (ref 78.0–100.0)
MONOS PCT: 9 % (ref 3–12)
Monocytes Absolute: 0.7 10*3/uL (ref 0.1–1.0)
NEUTROS PCT: 50 % (ref 43–77)
Neutro Abs: 3.8 10*3/uL (ref 1.7–7.7)
Platelets: 123 10*3/uL — ABNORMAL LOW (ref 150–400)
RBC: 5.72 MIL/uL (ref 4.22–5.81)
RDW: 12.9 % (ref 11.5–15.5)
WBC: 7.4 10*3/uL (ref 4.0–10.5)

## 2014-03-10 LAB — URINALYSIS, ROUTINE W REFLEX MICROSCOPIC
GLUCOSE, UA: NEGATIVE mg/dL
HGB URINE DIPSTICK: NEGATIVE
Ketones, ur: NEGATIVE mg/dL
LEUKOCYTES UA: NEGATIVE
Nitrite: NEGATIVE
PH: 6 (ref 5.0–8.0)
Protein, ur: NEGATIVE mg/dL
SPECIFIC GRAVITY, URINE: 1.027 (ref 1.005–1.030)

## 2014-03-10 LAB — LIPASE, BLOOD: LIPASE: 30 U/L (ref 11–59)

## 2014-03-10 MED ORDER — KETOROLAC TROMETHAMINE 30 MG/ML IJ SOLN
30.0000 mg | Freq: Once | INTRAMUSCULAR | Status: AC
  Administered 2014-03-10: 30 mg via INTRAVENOUS
  Filled 2014-03-10: qty 1

## 2014-03-10 MED ORDER — OXYCODONE HCL 5 MG PO TABS: 5.0000 mg | ORAL_TABLET | Freq: Four times a day (QID) | ORAL | Status: DC | PRN

## 2014-03-10 MED ORDER — METOCLOPRAMIDE HCL 5 MG/ML IJ SOLN
10.0000 mg | Freq: Once | INTRAMUSCULAR | Status: AC
  Administered 2014-03-10: 10 mg via INTRAVENOUS
  Filled 2014-03-10: qty 2

## 2014-03-10 MED ORDER — SODIUM CHLORIDE 0.9 % IV SOLN
Freq: Once | INTRAVENOUS | Status: AC
  Administered 2014-03-10: 21:00:00 via INTRAVENOUS

## 2014-03-10 NOTE — ED Notes (Signed)
Pt alert, NAD, calm, interactive, resps e/u, speaking in clear complete sentences. VSS. BP elevated. "no h/o HTN". Here for R side abd pain and transient nausea. Mentions transient recent blood in urine. H/o cholecystectomy. Also h/o hep C & pancreatitis. (denies: constipation, vd, blood in stool, sob, dizziness, fever, other urinary sx, back pain or other sx). Onset 2d ago. Last flare up last month.

## 2014-03-10 NOTE — ED Notes (Signed)
Pt admits to RUQ pain and rt flank pain x2 months intermittently  - pt admits to hx of hep C and kidney stones - pt also experienced hematuria las week as well.

## 2014-03-10 NOTE — ED Provider Notes (Signed)
CSN: [id number]     Arrival date & time 03/10/14  1707 History   First MD Initiated Contact with Patient 03/10/14 2108     Chief Complaint  Patient presents with  . Abdominal Pain     (Consider location/radiation/quality/duration/timing/severity/associated sxs/prior Treatment) HPI Comments: Michael Carroll is a 54 y.o. male with a PMHx of hep C, cirrhosis of liver, nephrolithiasis, DM2, migraines, HTN, and chronic smoker's cough, with a PSHx of cholecystectomy and ureteral stone placement in 2008/2009, who presents to the ED with complaints of ongoing right upper quadrant pain which is the exact same as it was 3 weeks ago he was evaluated in the emergency room. He states that this pain has been ongoing for 2 months, but 5 days ago it began to increase again. He reports 7/10 constant right upper quadrant dull crampy pain that radiates to the right flank, worse with movement and coughing, with no medications tried prior to arrival. He reports associated nausea. He reports that he has not yet followed up with the GI specialist that he was referred to the last time. Earlier this week he had some hematuria which has completely resolved at this time. He denies any fevers, chills, chest pain, shortness breath, cough, vomiting, diarrhea, constipation, obstipation, abd distension, melena, hematochezia, dysuria, hematuria, penile discharge, testicular pain or swelling, numbness, weakness, tingling, recent sick contacts, travel, antibiotic use, alcohol use, or NSAID use.  Patient is a 54 y.o. male presenting with abdominal pain. The history is provided by the patient. No language interpreter was used.  Abdominal Pain Pain location:  RUQ Pain quality: cramping and dull   Pain radiates to:  R flank Pain severity:  Moderate Onset quality:  Gradual Duration:  5 days (although chronic pain x1 month) Timing:  Constant Progression:  Unchanged Chronicity:  Chronic Context: not alcohol use, not diet changes,  not recent travel, not sick contacts, not suspicious food intake and not trauma   Relieved by:  None tried Worsened by:  Coughing and movement Ineffective treatments:  None tried Associated symptoms: nausea   Associated symptoms: no belching, no chest pain, no chills, no constipation, no cough, no diarrhea, no dysuria, no fever, no flatus, no hematemesis, no hematochezia, no hematuria (one episode earlier in the week, now completely resolved), no melena, no shortness of breath and no vomiting     Past Medical History  Diagnosis Date  . Cirrhosis of liver without mention of alcohol     PER LIVER BX 03/2012  . Right ureteral stone   . History of kidney stones   . Type 2 diabetes, diet controlled   . History of migraine   . Hypertension     NO MEDS  . Smokers' cough   . Productive cough   . At risk for sleep apnea     STOP-BANG=  4     . Headache(784.0)     migraines   Past Surgical History  Procedure Laterality Date  . Cholecystectomy N/A 03/26/2012    Procedure: LAPAROSCOPIC CHOLECYSTECTOMY;  Surgeon: Romie Levee, MD;  Location: [facility];  Service: General;  Laterality: N/A;  . Liver biopsy N/A 03/26/2012    Procedure: LIVER BIOPSY;  Surgeon: Romie Levee, MD;  Location: [facility];  Service: General;  Laterality: N/A;  . Cystoscopy with retrograde pyelogram, ureteroscopy and stent placement Right 05/15/2013    Procedure: CYSTOSCOPY WITH RETROGRADE PYELOGRAM, URETEROSCOPY AND STENT PLACEMENT;  Surgeon: Crist Fat, MD;  Location: [facility];  Service: Urology;  Laterality: Right;  .  Cysto/  right ureteral stent placement  06/2006  . Cysto/   right ureteral stent exchange  12/2007  . Extracorporeal shock wave lithotripsy    . Cardiovascular stress test  2004    CARDIOLITE STUDY NORMAL/  EF 60%  . Cystoscopy with retrograde pyelogram, ureteroscopy and stent placement Right 05/30/2013    Procedure: CYSTOSCOPY WITH RIGHT RETROGRADE PYELOGRAM, URETEROSCOPY LASER LITHOTRIPSY AND STENT  EXCHANGE ;  Surgeon: Crist Fat, MD;  Location[facility];  Service: Urology;  Laterality: Right;  . Holmium laser application Right 05/30/2013    Procedure: HOLMIUM LASER APPLICATION;  Surgeon: Crist Fat, MD;  Location[facility];  Service: Urology;  Laterality: Right;  . Cystoscopy  07/11/2013    Procedure: CYSTOSCOPY RIGHT STENT REMOVAL;  Surgeon: Crist Fat, MD;  Location: [facility];  Service: Urology;;   Family History  Problem Relation Age of Onset  . Cancer - Lung Mother   . Cancer Mother   . Seizures Sister   . Heart disease Father    History  Substance Use Topics  . Smoking status: Current Every Day Smoker -- 1.50 packs/day for 25 years    Types: Cigarettes  . Smokeless tobacco: Never Used  . Alcohol Use: No    Review of Systems  Constitutional: Negative for fever and chills.  Respiratory: Negative for cough and shortness of breath.   Cardiovascular: Negative for chest pain.  Gastrointestinal: Positive for nausea and abdominal pain. Negative for vomiting, diarrhea, constipation, blood in stool, melena, hematochezia, abdominal distention, flatus and hematemesis.  Genitourinary: Positive for flank pain (slight R sided). Negative for dysuria, urgency, frequency, hematuria (one episode earlier in the week, now completely resolved), discharge, scrotal swelling, penile pain and testicular pain.  Musculoskeletal: Negative for myalgias and arthralgias.  Skin: Negative for color change.  Allergic/Immunologic: Positive for immunocompromised state (Hep C).  Neurological: Negative for dizziness, weakness, light-headedness and numbness.  Psychiatric/Behavioral: Negative for confusion.   10 Systems reviewed and are negative for acute change except as noted in the HPI.    Allergies  Zofran and Compazine  Home Medications   Prior to Admission medications   Medication Sig Start Date End Date Taking? Authorizing Provider  aspirin  81 MG tablet Take 81 mg by mouth every evening.    Yes Historical Provider, MD  metoCLOPramide (REGLAN) 10 MG tablet Take 1 tablet (10 mg total) by mouth every 6 (six) hours. 02/17/14  Yes Nicole Pisciotta, PA-C  oxyCODONE (ROXICODONE) 5 MG immediate release tablet Take 1-2 tablets every 4-6 hours as needed for pain control 02/17/14   Nicole Pisciotta, PA-C   BP 151/92 mmHg  Pulse 65  Temp(Src) 98.4 F (36.9 C) (Oral)  Resp 18  Ht 5\' 6"  (1.676 m)  Wt 270 lb (122.471 kg)  BMI 43.60 kg/m2  SpO2 97% Physical Exam  Constitutional: He is oriented to person, place, and time. Vital signs are normal. He appears well-developed and well-nourished.  Non-toxic appearance. No distress.  Afebrile nontoxic NAD, mild HTN noted  HENT:  Head: Normocephalic and atraumatic.  Mouth/Throat: Oropharynx is clear and moist and mucous membranes are normal.  Eyes: Conjunctivae and EOM are normal. Right eye exhibits no discharge. Left eye exhibits no discharge.  Neck: Normal range of motion. Neck supple.  Cardiovascular: Normal rate, regular rhythm, normal heart sounds and intact distal pulses.  Exam reveals no gallop and no friction rub.   No murmur heard. Pulmonary/Chest: Effort normal and breath sounds normal. No  respiratory distress. He has no decreased breath sounds. He has no wheezes. He has no rhonchi. He has no rales.  Abdominal: Soft. Normal appearance and bowel sounds are normal. He exhibits no distension. There is tenderness in the right upper quadrant and epigastric area. There is CVA tenderness (mild R sided). There is no rigidity, no rebound, no guarding, no tenderness at McBurney's point and negative Murphy's sign.    Soft, ND, no fluid wave, +BS throughout, with epigastric and RUQ TTP, no r/g/r, neg murphy's, neg mcburney's, trace R sided CVA TTP   Musculoskeletal: Normal range of motion.  Neurological: He is alert and oriented to person, place, and time. He has normal strength. No sensory deficit.    Skin: Skin is warm, dry and intact. No rash noted.  Psychiatric: He has a normal mood and affect.  Nursing note and vitals reviewed.   ED Course  Procedures (including critical care time) Labs Review Labs Reviewed  CBC WITH DIFFERENTIAL/PLATELET - Abnormal; Notable for the following:    Platelets 123 (*)    All other components within normal limits  COMPREHENSIVE METABOLIC PANEL - Abnormal; Notable for the following:    Glucose, Bld 112 (*)    BUN 24 (*)    AST 54 (*)    ALT 59 (*)    Anion gap 2 (*)    All other components within normal limits  URINALYSIS, ROUTINE W REFLEX MICROSCOPIC - Abnormal; Notable for the following:    Color, Urine AMBER (*)    Bilirubin Urine SMALL (*)    Urobilinogen, UA >8.0 (*)    All other components within normal limits  LIPASE, BLOOD    Imaging Review No results found. US Abdomen Complete  02/17/2014   CLINICAL DATA:  Right upper quadrant abdominal pain intermittently for the past 3 weeks. Worsening over the past day. Pain is intermittent and in waves. Nausea and diarrhea. Recent hepatitis c diagnosis.  EXAM: ULTRASOUND ABDOMEN COMPLETE  COMPARISON:  Multiple exams, including 01/20/2014  FINDINGS: Body habitus reduces diagnostic sensitivity and specificity.  Gallbladder: Surgically absent  Common bile duct: Diameter: 3 mm  Liver: No focal lesion identified. Morphologic suggestion of early cirrhosis. The liver is somewhat echogenic but was not fatty infiltrated on the prior exam from earlier this month.  IVC: Not seen due to overlying bowel gas.  Pancreas: Not seen due to overlying bowel gas  Spleen: Upper normal size. Accessory spleen along the splenic hilum.  Right Kidney: Length: 12.1 cm. Moderate right hydronephrosis, which does not resolved postvoid. No visible stones.  Left Kidney: Length: 11.6 cm. Echogenicity within normal limits. No mass or hydronephrosis visualized.  Abdominal aorta: No aneurysm visualized. Portions obscured by overlying bowel  gas.  Other findings: None.  IMPRESSION: 1. Moderate right hydronephrosis.  No stones identified. 2. Morphologic suggestion of early hepatic cirrhosis. 3. Overlying bowel gas and body habitus obscure the IVC, pancreas, and portions of the abdominal aorta.   Electronically Signed   By: Herbie Baltimore M.D.   On: 02/17/2014 19:58   Ct Renal Stone Study  02/17/2014   CLINICAL DATA:  Acute onset of right-sided abdominal pain, with nausea and loose stools. Initial encounter.  EXAM: CT ABDOMEN AND PELVIS WITHOUT CONTRAST  TECHNIQUE: Multidetector CT imaging of the abdomen and pelvis was performed following the standard protocol without IV contrast.  COMPARISON:  CT of the abdomen and pelvis from 01/20/2014  FINDINGS: A 4 mm nodule is noted at the left lung base (image 1 of  38).  There is minimal nodularity of the contour of the liver, compatible with mild hepatic cirrhosis. The spleen is enlarged, measuring 15.1 cm in length. The patient is status post cholecystectomy, with clips noted at the gallbladder fossa. The pancreas and adrenal glands are unremarkable.  Previously noted right-sided hydronephrosis has resolved, with residual right-sided pelvicaliectasis and minimal associated stranding. No renal or ureteral stones are seen. The left kidney is unremarkable in appearance.  No free fluid is identified. The small bowel is unremarkable in appearance. The stomach is within normal limits. No acute vascular abnormalities are seen. Minimal calcification is noted along the branches of the abdominal aorta, including along the proximal inferior mesenteric artery.  The appendix is prominent and stable in appearance. There is no associated soft tissue inflammation to suggest appendicitis. This likely reflects the patient's baseline. The colon is unremarkable in appearance.  The bladder is decompressed and not well assessed. The prostate remains normal in size, with minimal calcification. No inguinal lymphadenopathy is seen.   No acute osseous abnormalities are identified.  IMPRESSION: 1. Residual right-sided renal pelvicaliectasis and minimal associated stranding. This could reflect mild ureteritis and pyelonephritis. Previously noted hydronephrosis has resolved. 2. Minimal nodularity of the contour of the liver, compatible with mild hepatic cirrhosis. 3. Splenomegaly. 4. 4 mm nodule at the left lung base. If the patient is at high risk for bronchogenic carcinoma, follow-up chest CT at 1 year is recommended. If the patient is at low risk, no follow-up is needed. This recommendation follows the consensus statement: Guidelines for Management of Small Pulmonary Nodules Detected on CT Scans: A Statement from the Fleischner Society as published in Radiology 2005; 237:395-400.   Electronically Signed   By: Roanna Raider M.D.   On: 02/17/2014 21:03      EKG Interpretation None      MDM   Final diagnoses:  Abdominal pain, chronic, right upper quadrant  History of cirrhosis of liver  Elevated transaminase level  Nausea  HTN (hypertension), benign    54 y.o. male here for abd pain, same as the last time he was seen. At that time he had a CT which showed perinephric stranding and L lung nodule. Pt was instructed to see GI but hasn't yet. Exam reveals epigastric and RUQ TTP, with very slight R flank tenderness. Afebrile, nontoxic. U/A pending. CBC with persisting thrombocytopenia, improved since last time. CMP showing mildly elevated BUN, AST and ALT persisting with elevation to 54/59 respectively. Lipase WNL. Will give pain and nausea meds here and reassess. Doubt need for imaging now since he's already had extensive imaging. Will reassess after U/A results.  11:20 PM U/A with small bili which is chronic, but no RBC or signs of infection. Given this finding, doubt need for further imaging. Pt feeling improved, will PO challenge. Reiterated to him that he needs to f/up with GI specialist given to him at last visit.  11:36  PM Pt tolerating PO well. Has home nausea meds. Will give small amount of roxicodone, and instructed pt to take ibuprofen for pain. Discussed f/up with his PCP for ongoing management of BP, as well as GI specialist for ongoing care of his abd pain. I explained the diagnosis and have given explicit precautions to return to the ER including for any other new or worsening symptoms. The patient understands and accepts the medical plan as it's been dictated and I have answered their questions. Discharge instructions concerning home care and prescriptions have been given. The patient is STABLE  and is discharged to home in good condition.  BP 156/86 mmHg  Pulse 63  Temp(Src) 98.4 F (36.9 C) (Oral)  Resp 18  Ht 5\' 6"  (1.676 m)  Wt 270 lb (122.471 kg)  BMI 43.60 kg/m2  SpO2 97%  Meds ordered this encounter  Medications  . 0.9 %  sodium chloride infusion    Sig:   . ketorolac (TORADOL) 30 MG/ML injection 30 mg    Sig:   . metoCLOPramide (REGLAN) injection 10 mg    Sig:   . oxyCODONE (ROXICODONE) 5 MG immediate release tablet    Sig: Take 1 tablet (5 mg total) by mouth every 6 (six) hours as needed for severe pain.    Dispense:  12 tablet    Refill:  0    Order Specific Question:  Supervising Provider    Answer:  Vida RollerMILLER, BRIAN D [id number]]     Albie Arizpe Strupp Camprubi-Soms, PA-C 03/10/14 [phone]  Toy CookeyMegan Docherty, MD 03/11/14 [phone]

## 2014-03-10 NOTE — ED Notes (Signed)
No changes, updated, resting, NAD, calm. VSS.

## 2014-03-10 NOTE — Discharge Instructions (Signed)
Take ibuprofen or oxycodone as directed as needed for pain, but don't drive while taking oxycodone. Use your home nausea medications. Stay well hydrated. Follow up with your GI specialist and your primary doctor for ongoing care. Return to the ER for changes or worsening symptoms.   Abdominal Pain Many things can cause belly (abdominal) pain. Most times, the belly pain is not dangerous. Many cases of belly pain can be watched and treated at home. HOME CARE   Do not take medicines that help you go poop (laxatives) unless told to by your doctor.  Only take medicine as told by your doctor.  Eat or drink as told by your doctor. Your doctor will tell you if you should be on a special diet. GET HELP IF:  You do not know what is causing your belly pain.  You have belly pain while you are sick to your stomach (nauseous) or have runny poop (diarrhea).  You have pain while you pee or poop.  Your belly pain wakes you up at night.  You have belly pain that gets worse or better when you eat.  You have belly pain that gets worse when you eat fatty foods.  You have a fever. GET HELP RIGHT AWAY IF:   The pain does not go away within 2 hours.  You keep throwing up (vomiting).  The pain changes and is only in the right or left part of the belly.  You have bloody or tarry looking poop. MAKE SURE YOU:   Understand these instructions.  Will watch your condition.  Will get help right away if you are not doing well or get worse. Document Released: 06/23/2007 Document Revised: 01/09/2013 Document Reviewed: 09/13/2012 [facility] Patient Information 2015 [place], [place]. This information is not intended to replace advice given to you by your health care provider. Make sure you discuss any questions you have with your health care provider.  Nausea, Adult Nausea means you feel sick to your stomach or need to throw up (vomit). It may be a sign of a more serious problem. If nausea gets worse, you may  throw up. If you throw up a lot, you may lose too much body fluid (dehydration). HOME CARE   Get plenty of rest.  Ask your doctor how to replace body fluid losses (rehydrate).  Eat small amounts of food. Sip liquids more often.  Take all medicines as told by your doctor. GET HELP RIGHT AWAY IF:  You have a fever.  You pass out (faint).  You keep throwing up or have blood in your throw up.  You are very weak, have dry lips or a dry mouth, or you are very thirsty (dehydrated).  You have dark or bloody poop (stool).  You have very bad chest or belly (abdominal) pain.  You do not get better after 2 days, or you get worse.  You have a headache. MAKE SURE YOU:  Understand these instructions.  Will watch your condition.  Will get help right away if you are not doing well or get worse. Document Released: 12/24/2010 Document Revised: 03/29/2011 Document Reviewed: 12/24/2010 [facility] Patient Information 2015 [place], [place]. This information is not intended to replace advice given to you by your health care provider. Make sure you discuss any questions you have with your health care provider.  Hypertension Hypertension is another name for high blood pressure. High blood pressure forces your heart to work harder to pump blood. A blood pressure reading has two numbers, which includes a higher  number over a lower number (example: 110/72). HOME CARE   Have your blood pressure rechecked by your doctor.  Only take medicine as told by your doctor. Follow the directions carefully. The medicine does not work as well if you skip doses. Skipping doses also puts you at risk for problems.  Do not smoke.  Monitor your blood pressure at home as told by your doctor. GET HELP IF:  You think you are having a reaction to the medicine you are taking.  You have repeat headaches or feel dizzy.  You have puffiness (swelling) in your ankles.  You have trouble with your vision. GET HELP RIGHT  AWAY IF:   You get a very bad headache and are confused.  You feel weak, numb, or faint.  You get chest or belly (abdominal) pain.  You throw up (vomit).  You cannot breathe very well. MAKE SURE YOU:   Understand these instructions.  Will watch your condition.  Will get help right away if you are not doing well or get worse. Document Released: 06/23/2007 Document Revised: 01/09/2013 Document Reviewed: 10/27/2012 [facility] Patient Information 2015 [place]. This information is not intended to replace advice given to you by your health care provider. Make sure you discuss any questions you have with your health care provider.

## 2014-03-10 NOTE — ED Notes (Signed)
Given ice water per request for PO challenge. States, "feel much better".

## 2014-10-13 ENCOUNTER — Encounter (HOSPITAL_BASED_OUTPATIENT_CLINIC_OR_DEPARTMENT_OTHER): Payer: Self-pay | Admitting: *Deleted

## 2014-10-13 ENCOUNTER — Emergency Department (HOSPITAL_BASED_OUTPATIENT_CLINIC_OR_DEPARTMENT_OTHER)
Admission: EM | Admit: 2014-10-13 | Discharge: 2014-10-13 | Disposition: A | Discharge status: Home / Self Care | Payer: Medicaid Other | Attending: Emergency Medicine | Admitting: Emergency Medicine

## 2014-10-13 ENCOUNTER — Emergency Department (HOSPITAL_BASED_OUTPATIENT_CLINIC_OR_DEPARTMENT_OTHER): Payer: Medicaid Other

## 2014-10-13 DIAGNOSIS — I10 Essential (primary) hypertension: Secondary | ICD-10-CM | POA: Insufficient documentation

## 2014-10-13 DIAGNOSIS — R109 Unspecified abdominal pain: Secondary | ICD-10-CM

## 2014-10-13 DIAGNOSIS — Z720 Tobacco use: Secondary | ICD-10-CM | POA: Insufficient documentation

## 2014-10-13 DIAGNOSIS — E119 Type 2 diabetes mellitus without complications: Secondary | ICD-10-CM | POA: Insufficient documentation

## 2014-10-13 DIAGNOSIS — N200 Calculus of kidney: Secondary | ICD-10-CM | POA: Insufficient documentation

## 2014-10-13 DIAGNOSIS — Z79899 Other long term (current) drug therapy: Secondary | ICD-10-CM | POA: Insufficient documentation

## 2014-10-13 DIAGNOSIS — Z7982 Long term (current) use of aspirin: Secondary | ICD-10-CM | POA: Insufficient documentation

## 2014-10-13 LAB — CBC WITH DIFFERENTIAL/PLATELET
BASOS ABS: 0 10*3/uL (ref 0.0–0.1)
Basophils Relative: 0 %
EOS ABS: 0.2 10*3/uL (ref 0.0–0.7)
Eosinophils Relative: 2 %
HCT: 48.2 % (ref 39.0–52.0)
Hemoglobin: 17 g/dL (ref 13.0–17.0)
Lymphocytes Relative: 22 %
Lymphs Abs: 1.4 10*3/uL (ref 0.7–4.0)
MCH: 29.9 pg (ref 26.0–34.0)
MCHC: 35.3 g/dL (ref 30.0–36.0)
MCV: 84.9 fL (ref 78.0–100.0)
Monocytes Absolute: 0.5 10*3/uL (ref 0.1–1.0)
Monocytes Relative: 8 %
NEUTROS PCT: 68 %
Neutro Abs: 4.4 10*3/uL (ref 1.7–7.7)
Platelets: 115 10*3/uL — ABNORMAL LOW (ref 150–400)
RBC: 5.68 MIL/uL (ref 4.22–5.81)
RDW: 12.7 % (ref 11.5–15.5)
WBC: 6.4 10*3/uL (ref 4.0–10.5)

## 2014-10-13 LAB — URINALYSIS, ROUTINE W REFLEX MICROSCOPIC
Bilirubin Urine: NEGATIVE
Glucose, UA: NEGATIVE mg/dL
Ketones, ur: NEGATIVE mg/dL
Leukocytes, UA: NEGATIVE
Nitrite: NEGATIVE
PH: 7 (ref 5.0–8.0)
PROTEIN: NEGATIVE mg/dL
Specific Gravity, Urine: 1.02 (ref 1.005–1.030)
Urobilinogen, UA: 4 mg/dL — ABNORMAL HIGH (ref 0.0–1.0)

## 2014-10-13 LAB — COMPREHENSIVE METABOLIC PANEL
ALBUMIN: 3.9 g/dL (ref 3.5–5.0)
ALT: 49 U/L (ref 17–63)
AST: 43 U/L — AB (ref 15–41)
Alkaline Phosphatase: 67 U/L (ref 38–126)
Anion gap: 7 (ref 5–15)
BUN: 16 mg/dL (ref 6–20)
CHLORIDE: 105 mmol/L (ref 101–111)
CO2: 27 mmol/L (ref 22–32)
CREATININE: 0.79 mg/dL (ref 0.61–1.24)
Calcium: 9.3 mg/dL (ref 8.9–10.3)
GFR calc Af Amer: >60 mL/min (ref >60)
GFR calc non Af Amer: >60 mL/min (ref >60)
GLUCOSE: 177 mg/dL — AB (ref 65–99)
Potassium: 3.9 mmol/L (ref 3.5–5.1)
SODIUM: 139 mmol/L (ref 135–145)
Total Bilirubin: 0.6 mg/dL (ref 0.3–1.2)
Total Protein: 7.1 g/dL (ref 6.5–8.1)

## 2014-10-13 LAB — URINE MICROSCOPIC-ADD ON

## 2014-10-13 LAB — LIPASE, BLOOD: Lipase: 49 U/L (ref 22–51)

## 2014-10-13 MED ORDER — SODIUM CHLORIDE 0.9 % IV BOLUS (SEPSIS)
1000.0000 mL | Freq: Once | INTRAVENOUS | Status: AC
  Administered 2014-10-13: 1000 mL via INTRAVENOUS

## 2014-10-13 MED ORDER — PROMETHAZINE HCL 25 MG PO TABS: 25.0000 mg | ORAL_TABLET | Freq: Four times a day (QID) | ORAL | Status: DC | PRN

## 2014-10-13 MED ORDER — ALUM & MAG HYDROXIDE-SIMETH 200-200-20 MG/5ML PO SUSP
15.0000 mL | Freq: Once | ORAL | Status: AC
  Administered 2014-10-13: 15 mL via ORAL
  Filled 2014-10-13: qty 30

## 2014-10-13 MED ORDER — SODIUM CHLORIDE 0.9 % IV SOLN
1000.0000 mL | Freq: Once | INTRAVENOUS | Status: AC
  Administered 2014-10-13: 1000 mL via INTRAVENOUS

## 2014-10-13 MED ORDER — OXYCODONE HCL 5 MG PO TABS: 5.0000 mg | ORAL_TABLET | Freq: Four times a day (QID) | ORAL | Status: DC | PRN

## 2014-10-13 MED ORDER — LIDOCAINE VISCOUS 2 % MT SOLN
15.0000 mL | Freq: Once | OROMUCOSAL | Status: AC
  Administered 2014-10-13: 15 mL via OROMUCOSAL
  Filled 2014-10-13: qty 15

## 2014-10-13 MED ORDER — KETOROLAC TROMETHAMINE 30 MG/ML IJ SOLN
30.0000 mg | Freq: Once | INTRAMUSCULAR | Status: AC
  Administered 2014-10-13: 30 mg via INTRAVENOUS
  Filled 2014-10-13: qty 1

## 2014-10-13 MED ORDER — MORPHINE SULFATE (PF) 4 MG/ML IV SOLN
4.0000 mg | Freq: Once | INTRAVENOUS | Status: AC
  Administered 2014-10-13: 4 mg via INTRAVENOUS
  Filled 2014-10-13: qty 1

## 2014-10-13 MED ORDER — PROMETHAZINE HCL 25 MG PO TABS
50.0000 mg | ORAL_TABLET | Freq: Once | ORAL | Status: AC
  Administered 2014-10-13: 50 mg via ORAL
  Filled 2014-10-13: qty 2

## 2014-10-13 NOTE — ED Notes (Signed)
MD at bedside. 

## 2014-10-13 NOTE — ED Notes (Signed)
Safety measures in place 

## 2014-10-13 NOTE — ED Notes (Signed)
LLQ to left flank pain, poor appetite

## 2014-10-13 NOTE — Discharge Instructions (Signed)

## 2014-10-13 NOTE — ED Provider Notes (Signed)
CSN: [id number]     Arrival date & time 10/13/14  1109 History   First MD Initiated Contact with Patient 10/13/14 1118     Chief Complaint  Patient presents with  . Flank Pain     (Consider location/radiation/quality/duration/timing/severity/associated sxs/prior Treatment) Patient is a 54 y.o. male presenting with flank pain. The history is provided by the patient.  Flank Pain This is a recurrent problem. The current episode started more than 1 week ago. The problem occurs constantly. The problem has been gradually worsening. Pertinent negatives include no chest pain, no abdominal pain, no headaches and no shortness of breath. Nothing aggravates the symptoms. Nothing relieves the symptoms. He has tried nothing for the symptoms. The treatment provided no relief.    54 yo M with a chief complaint of left flank pain. This been going on for the past couple weeks. Patient states slightly worsening now. Had been diagnosed with a kidney stone previously. Patient now states is also having left upper quadrant abdominal pain. Anorexia. Denies fevers or chills. Having no noted dysuria.  Past Medical History  Diagnosis Date  . Cirrhosis of liver without mention of alcohol     PER LIVER BX 03/2012  . Right ureteral stone   . History of kidney stones   . Type 2 diabetes, diet controlled   . History of migraine   . Hypertension     NO MEDS  . Smokers' cough   . Productive cough   . At risk for sleep apnea     STOP-BANG=  4     . Headache(784.0)     migraines   Past Surgical History  Procedure Laterality Date  . Cholecystectomy N/A 03/26/2012    Procedure: LAPAROSCOPIC CHOLECYSTECTOMY;  Surgeon: Romie Levee, MD;  Location: [facility];  Service: General;  Laterality: N/A;  . Liver biopsy N/A 03/26/2012    Procedure: LIVER BIOPSY;  Surgeon: Romie Levee, MD;  Location: [facility];  Service: General;  Laterality: N/A;  . Cystoscopy with retrograde pyelogram, ureteroscopy and stent placement Right  05/15/2013    Procedure: CYSTOSCOPY WITH RETROGRADE PYELOGRAM, URETEROSCOPY AND STENT PLACEMENT;  Surgeon: Crist Fat, MD;  Location: [facility];  Service: Urology;  Laterality: Right;  . Cysto/  right ureteral stent placement  06/2006  . Cysto/   right ureteral stent exchange  12/2007  . Extracorporeal shock wave lithotripsy    . Cardiovascular stress test  2004    CARDIOLITE STUDY NORMAL/  EF 60%  . Cystoscopy with retrograde pyelogram, ureteroscopy and stent placement Right 05/30/2013    Procedure: CYSTOSCOPY WITH RIGHT RETROGRADE PYELOGRAM, URETEROSCOPY LASER LITHOTRIPSY AND STENT EXCHANGE ;  Surgeon: Crist Fat, MD;  Location: [facility];  Service: Urology;  Laterality: Right;  . Holmium laser application Right 05/30/2013    Procedure: HOLMIUM LASER APPLICATION;  Surgeon: Crist Fat, MD;  Location: [facility];  Service: Urology;  Laterality: Right;  . Cystoscopy  07/11/2013    Procedure: CYSTOSCOPY RIGHT STENT REMOVAL;  Surgeon: Crist Fat, MD;  Location: [facility];  Service: Urology;;   Family History  Problem Relation Age of Onset  . Cancer - Lung Mother   . Cancer Mother   . Seizures Sister   . Heart disease Father    Social History  Substance Use Topics  . Smoking status: Current Every Day Smoker -- 1.50 packs/day for 25 years    Types: Cigarettes  . Smokeless tobacco: Never Used  . Alcohol Use: No  Review of Systems  Constitutional: Negative for fever and chills.  HENT: Negative for congestion and facial swelling.   Eyes: Negative for discharge and visual disturbance.  Respiratory: Negative for shortness of breath.   Cardiovascular: Negative for chest pain and palpitations.  Gastrointestinal: Negative for vomiting, abdominal pain and diarrhea.  Genitourinary: Positive for flank pain.  Musculoskeletal: Negative for myalgias and arthralgias.  Skin: Negative for color change and rash.  Neurological: Negative for  tremors, syncope and headaches.  Psychiatric/Behavioral: Negative for confusion and dysphoric mood.      Allergies  Zofran and Compazine  Home Medications   Prior to Admission medications   Medication Sig Start Date End Date Taking? Authorizing Provider  aspirin 81 MG tablet Take 81 mg by mouth every evening.     Historical Provider, MD  metoCLOPramide (REGLAN) 10 MG tablet Take 1 tablet (10 mg total) by mouth every 6 (six) hours. 02/17/14   Nicole Pisciotta, PA-C  oxyCODONE (ROXICODONE) 5 MG immediate release tablet Take 1-2 tablets every 4-6 hours as needed for pain control 02/17/14   Joni Reining Pisciotta, PA-C  oxyCODONE (ROXICODONE) 5 MG immediate release tablet Take 1 tablet (5 mg total) by mouth every 6 (six) hours as needed for severe pain. 10/13/14   Melene Plan, DO  promethazine (PHENERGAN) 25 MG tablet Take 1 tablet (25 mg total) by mouth every 6 (six) hours as needed for nausea or vomiting. 10/13/14   Melene Plan, DO   BP 208/96 mmHg  Pulse 63  Temp(Src) 98.5 F (36.9 C) (Oral)  Resp 18  Ht 6' (1.829 m)  Wt 238 lb 1.6 oz (108 kg)  BMI 32.28 kg/m2  SpO2 100% Physical Exam  Constitutional: He is oriented to person, place, and time. He appears well-developed and well-nourished.  HENT:  Head: Normocephalic and atraumatic.  Eyes: EOM are normal. Pupils are equal, round, and reactive to light.  Neck: Normal range of motion. Neck supple. No JVD present.  Cardiovascular: Normal rate and regular rhythm.  Exam reveals no gallop and no friction rub.   No murmur heard. Pulmonary/Chest: No respiratory distress. He has no wheezes.  Abdominal: He exhibits no distension. There is tenderness (LUQ, mild L flank pain). There is no rebound and no guarding.  Musculoskeletal: Normal range of motion.  Neurological: He is alert and oriented to person, place, and time.  Skin: No rash noted. No pallor.  Psychiatric: He has a normal mood and affect. His behavior is normal.    ED Course  Procedures  (including critical care time) Labs Review Labs Reviewed  CBC WITH DIFFERENTIAL/PLATELET - Abnormal; Notable for the following:    Platelets 115 (*)    All other components within normal limits  COMPREHENSIVE METABOLIC PANEL - Abnormal; Notable for the following:    Glucose, Bld 177 (*)    AST 43 (*)    All other components within normal limits  URINALYSIS, ROUTINE W REFLEX MICROSCOPIC (NOT AT [facility]) - Abnormal; Notable for the following:    Hgb urine dipstick TRACE (*)    Urobilinogen, UA 4.0 (*)    All other components within normal limits  LIPASE, BLOOD  URINE MICROSCOPIC-ADD ON  LACTIC ACID, PLASMA    Imaging Review Ct Renal Stone Study  10/13/2014   CLINICAL DATA:  54 year old male with left-sided flank pain for 1 month. History of kidney stones and multiple prior interventions.  EXAM: CT ABDOMEN AND PELVIS WITHOUT CONTRAST  TECHNIQUE: Multidetector CT imaging of the abdomen and pelvis was performed following  the standard protocol without IV contrast.  COMPARISON:  CT the abdomen and pelvis 09/11/2014.  FINDINGS: Lower chest:  Unremarkable.  Hepatobiliary: Status post cholecystectomy. The liver has a shrunken appearance and nodular contour, with caudate lobe hypertrophy, indicative of underlying cirrhosis. No discrete cystic or solid hepatic lesion confidently identified on today's noncontrast CT examination.  Pancreas: No pancreatic mass or peripancreatic inflammatory changes on today's noncontrast CT examination.  Spleen: Unremarkable.  Adrenals/Urinary Tract: 3 mm calculus in the proximal third of the left ureter (image 58 of series 2). This is not associated with significant hydroureteronephrosis to indicate urinary tract obstruction at this time. 1 mm calculus in the upper pole collecting system of the right kidney. No additional calculi are noted along the course of the right ureter or within the lumen of the urinary bladder. The unenhanced appearance of the kidneys and urinary bladder  are otherwise unremarkable. Bilateral adrenal glands are normal in appearance.  Stomach/Bowel: Normal appearance of the stomach. No pathologic dilatation of small bowel or colon. Normal appendix.  Vascular/Lymphatic: Atherosclerotic calcifications throughout the abdominal and pelvic vasculature, without evidence of aneurysm no lymphadenopathy noted in the abdomen or pelvis on today's noncontrast CT examination.  Reproductive: Prostate gland and seminal vesicles are unremarkable in appearance.  Other: No significant volume of ascites.  No pneumoperitoneum.  Musculoskeletal: There are no aggressive appearing lytic or blastic lesions noted in the visualized portions of the skeleton.  IMPRESSION: 1. 3 mm calculus in the proximal third of the left ureter. At this time, there is no associated proximal hydroureteronephrosis or perinephric stranding to indicate urinary tract obstruction. 2. 1 mm nonobstructive calculus in the upper pole collecting system of the right kidney. 3. Normal appendix. 4. Mild atherosclerosis. 5. Stigmata of cirrhosis, as above. 6. Status post cholecystectomy.   Electronically Signed   By: Trudie Reed M.D.   On: 10/13/2014 12:33   I have personally reviewed and evaluated these images and lab results as part of my medical decision-making.   EKG Interpretation None      MDM   Final diagnoses:  Left flank pain  Nephrolithiasis    54 yo M with a chief complaint left flank pain. Patient recently diagnosed with a kidney stone that feels like this feels different than his normal kidney stone pain. CT stone study consistent with a 3 mm stone in the left ureter without hydronephrosis. Patient's pain significantly improved with morphine. Given Toradol awaiting urine.  UA negative for infection.  D/c home.  3:06 PM:  I have discussed the diagnosis/risks/treatment options with the patient and believe the pt to be eligible for discharge home to follow-up with Urology. We also discussed  returning to the ED immediately if new or worsening sx occur. We discussed the sx which are most concerning (e.g., fever, chills, sudden worsening pain, inability to tolerate PO) that necessitate immediate return. Medications administered to the patient during their visit and any new prescriptions provided to the patient are listed below.  Medications given during this visit Medications  0.9 %  sodium chloride infusion (0 mLs Intravenous Stopped 10/13/14 1233)  morphine 4 MG/ML injection 4 mg (4 mg Intravenous Given 10/13/14 1141)  alum & mag hydroxide-simeth (MAALOX/MYLANTA) 200-200-20 MG/5ML suspension 15 mL (15 mLs Oral Given 10/13/14 1234)  lidocaine (XYLOCAINE) 2 % viscous mouth solution 15 mL (15 mLs Mouth/Throat Given 10/13/14 1234)  sodium chloride 0.9 % bolus 1,000 mL (0 mLs Intravenous Stopped 10/13/14 1407)  ketorolac (TORADOL) 30 MG/ML injection 30 mg (30  mg Intravenous Given 10/13/14 1308)  promethazine (PHENERGAN) tablet 50 mg (50 mg Oral Given 10/13/14 1308)  sodium chloride 0.9 % bolus 1,000 mL (1,000 mLs Intravenous New Bag/Given 10/13/14 1429)    New Prescriptions   PROMETHAZINE (PHENERGAN) 25 MG TABLET    Take 1 tablet (25 mg total) by mouth every 6 (six) hours as needed for nausea or vomiting.     The patient appears reasonably screen and/or stabilized for discharge and I doubt any other medical condition or other [facility] requiring further screening, evaluation, or treatment in the ED at this time prior to discharge.    Melene Plan, DO 10/13/14 1506

## 2014-10-13 NOTE — ED Notes (Signed)
Placed on cont POX with int NBP monitoring

## 2014-10-16 LAB — I-STAT CG4 LACTIC ACID, ED: LACTIC ACID, VENOUS: 1.09 mmol/L (ref 0.5–2.0)

## 2015-02-10 ENCOUNTER — Encounter (HOSPITAL_BASED_OUTPATIENT_CLINIC_OR_DEPARTMENT_OTHER): Payer: Self-pay

## 2015-02-10 ENCOUNTER — Emergency Department (HOSPITAL_BASED_OUTPATIENT_CLINIC_OR_DEPARTMENT_OTHER)
Admission: EM | Admit: 2015-02-10 | Discharge: 2015-02-10 | Disposition: A | Discharge status: Home / Self Care | Payer: Medicaid Other | Attending: Emergency Medicine | Admitting: Emergency Medicine

## 2015-02-10 ENCOUNTER — Emergency Department (HOSPITAL_BASED_OUTPATIENT_CLINIC_OR_DEPARTMENT_OTHER): Payer: Medicaid Other

## 2015-02-10 DIAGNOSIS — N200 Calculus of kidney: Secondary | ICD-10-CM | POA: Insufficient documentation

## 2015-02-10 DIAGNOSIS — Z8719 Personal history of other diseases of the digestive system: Secondary | ICD-10-CM | POA: Insufficient documentation

## 2015-02-10 DIAGNOSIS — R109 Unspecified abdominal pain: Secondary | ICD-10-CM

## 2015-02-10 DIAGNOSIS — I10 Essential (primary) hypertension: Secondary | ICD-10-CM | POA: Insufficient documentation

## 2015-02-10 DIAGNOSIS — F17210 Nicotine dependence, cigarettes, uncomplicated: Secondary | ICD-10-CM | POA: Insufficient documentation

## 2015-02-10 DIAGNOSIS — E119 Type 2 diabetes mellitus without complications: Secondary | ICD-10-CM | POA: Insufficient documentation

## 2015-02-10 LAB — CBC WITH DIFFERENTIAL/PLATELET
BASOS PCT: 0 %
Basophils Absolute: 0 10*3/uL (ref 0.0–0.1)
EOS ABS: 0.1 10*3/uL (ref 0.0–0.7)
EOS PCT: 1 %
HCT: 53.4 % — ABNORMAL HIGH (ref 39.0–52.0)
HEMOGLOBIN: 18.5 g/dL — AB (ref 13.0–17.0)
Lymphocytes Relative: 21 %
Lymphs Abs: 2.2 10*3/uL (ref 0.7–4.0)
MCH: 29.4 pg (ref 26.0–34.0)
MCHC: 34.6 g/dL (ref 30.0–36.0)
MCV: 84.8 fL (ref 78.0–100.0)
MONOS PCT: 10 %
Monocytes Absolute: 1.1 10*3/uL — ABNORMAL HIGH (ref 0.1–1.0)
NEUTROS PCT: 68 %
Neutro Abs: 7.2 10*3/uL (ref 1.7–7.7)
Platelets: 130 10*3/uL — ABNORMAL LOW (ref 150–400)
RBC: 6.3 MIL/uL — ABNORMAL HIGH (ref 4.22–5.81)
RDW: 12.9 % (ref 11.5–15.5)
WBC: 10.7 10*3/uL — AB (ref 4.0–10.5)

## 2015-02-10 LAB — COMPREHENSIVE METABOLIC PANEL
ALK PHOS: 78 U/L (ref 38–126)
ALT: 46 U/L (ref 17–63)
ANION GAP: 6 (ref 5–15)
AST: 36 U/L (ref 15–41)
Albumin: 4.2 g/dL (ref 3.5–5.0)
BILIRUBIN TOTAL: 1 mg/dL (ref 0.3–1.2)
BUN: 22 mg/dL — ABNORMAL HIGH (ref 6–20)
CALCIUM: 9.6 mg/dL (ref 8.9–10.3)
CO2: 30 mmol/L (ref 22–32)
CREATININE: 1.09 mg/dL (ref 0.61–1.24)
Chloride: 105 mmol/L (ref 101–111)
Glucose, Bld: 115 mg/dL — ABNORMAL HIGH (ref 65–99)
Potassium: 4.2 mmol/L (ref 3.5–5.1)
Sodium: 141 mmol/L (ref 135–145)
TOTAL PROTEIN: 7.4 g/dL (ref 6.5–8.1)

## 2015-02-10 LAB — URINALYSIS, ROUTINE W REFLEX MICROSCOPIC
BILIRUBIN URINE: NEGATIVE
Glucose, UA: NEGATIVE mg/dL
HGB URINE DIPSTICK: NEGATIVE
KETONES UR: NEGATIVE mg/dL
Leukocytes, UA: NEGATIVE
NITRITE: NEGATIVE
PH: 6.5 (ref 5.0–8.0)
Protein, ur: NEGATIVE mg/dL
Specific Gravity, Urine: 1.022 (ref 1.005–1.030)

## 2015-02-10 LAB — LIPASE, BLOOD: LIPASE: 32 U/L (ref 11–51)

## 2015-02-10 MED ORDER — MORPHINE SULFATE (PF) 4 MG/ML IV SOLN
4.0000 mg | Freq: Once | INTRAVENOUS | Status: AC
Start: 2015-02-10 — End: 2015-02-10
  Administered 2015-02-10: 4 mg via INTRAVENOUS
  Filled 2015-02-10: qty 1

## 2015-02-10 MED ORDER — OXYCODONE HCL 5 MG PO TABS: 5.0000 mg | ORAL_TABLET | ORAL | Status: DC | PRN

## 2015-02-10 MED ORDER — SODIUM CHLORIDE 0.9 % IV BOLUS (SEPSIS)
1000.0000 mL | Freq: Once | INTRAVENOUS | Status: AC
  Administered 2015-02-10: 1000 mL via INTRAVENOUS

## 2015-02-10 MED ORDER — KETOROLAC TROMETHAMINE 30 MG/ML IJ SOLN
30.0000 mg | Freq: Once | INTRAMUSCULAR | Status: AC
  Administered 2015-02-10: 30 mg via INTRAVENOUS
  Filled 2015-02-10: qty 1

## 2015-02-10 MED ORDER — TAMSULOSIN HCL 0.4 MG PO CAPS: 0.4000 mg | ORAL_CAPSULE | Freq: Every day | ORAL | Status: DC

## 2015-02-10 MED ORDER — MORPHINE SULFATE (PF) 4 MG/ML IV SOLN
8.0000 mg | Freq: Once | INTRAVENOUS | Status: AC
  Administered 2015-02-10: 8 mg via INTRAVENOUS
  Filled 2015-02-10: qty 2

## 2015-02-10 MED FILL — TAMSULOSIN HCL 0.4 MG CAP: 0.4 | 30 days supply | Qty: 30 | Fill #0

## 2015-02-10 MED FILL — oxyCODONE HCL 5 MG TABS: 5 | 3 days supply | Qty: 15 | Fill #0

## 2015-02-10 NOTE — ED Notes (Signed)
Right flank pain x 1 weeks-left flank x 3 days-states feels like kidney stone-NAD

## 2015-02-10 NOTE — ED Provider Notes (Signed)
CSN: [id number]     Arrival date & time 02/10/15  1215 History   First MD Initiated Contact with Patient 02/10/15 1342     Chief Complaint  Patient presents with  . Flank Pain     (Consider location/radiation/quality/duration/timing/severity/associated sxs/prior Treatment) Patient is a 55 y.o. [place] presenting with flank pain. The history is provided by the patient.  Flank Pain This is a recurrent problem. The current episode started more than 1 week ago. The problem occurs constantly. The problem has been gradually worsening. Associated symptoms include abdominal pain. Pertinent negatives include no chest pain, no headaches and no shortness of breath. Nothing aggravates the symptoms. Nothing relieves the symptoms. He has tried nothing for the symptoms. The treatment provided no relief.    55 yo M with a chief complaint of right flank pain. This feels like his prior history of kidney stones. Patient has had multiple kidney stones and has had multiple need for interventions in the past. This pain is slightly atypical as it is in the right upper quadrant. Patient has no gallbladder. States it feels like his kidney stone with a colicky pain. Has some nausea but denies vomiting. Is been going on for about a week. Denies any fevers or dysuria. Pain radiates down to his groin.  Past Medical History  Diagnosis Date  . Cirrhosis of liver without mention of alcohol     PER LIVER BX 03/2012  . Right ureteral stone   . History of kidney stones   . Type 2 diabetes, diet controlled (HCC)   . History of migraine   . Hypertension     NO MEDS  . Smokers' cough (HCC)   . Productive cough   . At risk for sleep apnea     STOP-BANG=  4     . Headache(784.0)     migraines   Past Surgical History  Procedure Laterality Date  . Cholecystectomy N/A 03/26/2012    Procedure: LAPAROSCOPIC CHOLECYSTECTOMY;  Surgeon: Romie Levee, MD;  Location: [facility];  Service: General;  Laterality: N/A;  . Liver biopsy N/A  03/26/2012    Procedure: LIVER BIOPSY;  Surgeon: Romie Levee, MD;  Location: [facility];  Service: General;  Laterality: N/A;  . Cystoscopy with retrograde pyelogram, ureteroscopy and stent placement Right 4/Michael/2015    Procedure: CYSTOSCOPY WITH RETROGRADE PYELOGRAM, URETEROSCOPY AND STENT PLACEMENT;  Surgeon: Crist Fat, MD;  Location: [facility];  Service: Urology;  Laterality: Right;  . Cysto/  right ureteral stent placement  06/2006  . Cysto/   right ureteral stent exchange  12/2007  . Extracorporeal shock wave lithotripsy    . Cardiovascular stress test  2004    CARDIOLITE STUDY NORMAL/  EF 60%  . Cystoscopy with retrograde pyelogram, ureteroscopy and stent placement Right 05/30/2013    Procedure: CYSTOSCOPY WITH RIGHT RETROGRADE PYELOGRAM, URETEROSCOPY LASER LITHOTRIPSY AND STENT EXCHANGE ;  Surgeon: Crist Fat, MD;  Location: [facility];  Service: Urology;  Laterality: Right;  . Holmium laser application Right 05/30/2013    Procedure: HOLMIUM LASER APPLICATION;  Surgeon: Crist Fat, MD;  Location: [facility];  Service: Urology;  Laterality: Right;  . Cystoscopy  07/11/2013    Procedure: CYSTOSCOPY RIGHT STENT REMOVAL;  Surgeon: Crist Fat, MD;  Location: [facility];  Service: Urology;;   Family History  Problem Relation Age of Onset  . Cancer - Lung Mother   . Cancer Mother   . Seizures Sister   . Heart disease  Father    Social History  Substance Use Topics  . Smoking status: Current Every Day Smoker -- 1.50 packs/day for 25 years    Types: Cigarettes  . Smokeless tobacco: Never Used  . Alcohol Use: No    Review of Systems  Constitutional: Negative for fever and chills.  HENT: Negative for congestion and facial swelling.   Eyes: Negative for discharge and visual disturbance.  Respiratory: Negative for shortness of breath.   Cardiovascular: Negative for chest pain and palpitations.  Gastrointestinal: Positive for nausea and  abdominal pain. Negative for vomiting and diarrhea.  Genitourinary: Positive for flank pain.  Musculoskeletal: Negative for myalgias and arthralgias.  Skin: Negative for color change and rash.  Neurological: Negative for tremors, syncope and headaches.  Psychiatric/Behavioral: Negative for confusion and dysphoric mood.      Allergies  Zofran and Compazine  Home Medications   Prior to Admission medications   Medication Sig Start Date End Date Taking? Authorizing Provider  oxyCODONE (ROXICODONE) 5 MG immediate release tablet Take 1 tablet (5 mg total) by mouth every 4 (four) hours as needed for severe pain. 02/10/15   Melene Plan, DO  tamsulosin (FLOMAX) 0.4 MG CAPS capsule Take 1 capsule (0.4 mg total) by mouth daily after supper. 02/10/15   Melene Plan, DO   BP 164/100 mmHg  Pulse 90  Temp(Src) 98.1 F (36.7 C) (Oral)  Resp 16  Ht 6' (1.829 m)  Wt 263 lb (119.296 kg)  BMI 35.66 kg/m2  SpO2 97% Physical Exam  Constitutional: He is oriented to person, place, and time. He appears well-developed and well-nourished.  HENT:  Head: Normocephalic and atraumatic.  Eyes: EOM are normal. Pupils are equal, round, and reactive to light.  Neck: Normal range of motion. Neck supple. No JVD present.  Cardiovascular: Normal rate and regular rhythm.  Exam reveals no gallop and no friction rub.   No murmur heard. Pulmonary/Chest: No respiratory distress. He has no wheezes.  Abdominal: He exhibits no distension. There is tenderness (mild RUQ). There is no rebound and no guarding.  Musculoskeletal: Normal range of motion.  Neurological: He is alert and oriented to person, place, and time.  Skin: No rash noted. No pallor.  Psychiatric: He has a normal mood and affect. His behavior is normal.  Nursing note and vitals reviewed.   ED Course  Procedures (including critical care time) Labs Review Labs Reviewed  CBC WITH DIFFERENTIAL/PLATELET - Abnormal; Notable for the following:    WBC 10.7 (*)     RBC 6.30 (*)    Hemoglobin 18.5 (*)    HCT 53.4 (*)    Platelets 130 (*)    Monocytes Absolute 1.1 (*)    All other components within normal limits  COMPREHENSIVE METABOLIC PANEL - Abnormal; Notable for the following:    Glucose, Bld 115 (*)    BUN 22 (*)    All other components within normal limits  URINALYSIS, ROUTINE W REFLEX MICROSCOPIC (NOT AT [facility])  LIPASE, BLOOD    Imaging Review Ct Renal Stone Study  02/10/2015  CLINICAL DATA:  Chronic bilateral flank pain now worsening on the right side. EXAM: CT ABDOMEN AND PELVIS WITHOUT CONTRAST TECHNIQUE: Multidetector CT imaging of the abdomen and pelvis was performed following the standard protocol without IV contrast. COMPARISON:  10/13/2014.  11/30/2012. FINDINGS: Lower chest: 3 mm right lower lobe pulmonary nodule stable since 2014, consistent with benign process. Hepatobiliary: Nodular liver contour again noted, consistent with cirrhosis. Gallbladder surgically absent. No intrahepatic or extrahepatic  biliary dilation. Pancreas: No focal mass lesion. No dilatation of the main duct. No intraparenchymal cyst. No peripancreatic edema. Spleen: No splenomegaly. No focal mass lesion. Adrenals/Urinary Tract: No adrenal nodule or mass. Right kidney is enlarged and edematous with moderate right hydronephrosis. Vascular calcifications seen in the renal hilum, but no evidence for right renal stone. Right ureter remains dilated down into the pelvis where a 1-2 mm stone is identified at the level of the right iliac vessels. No left renal or ureteral stones. No mass lesion is evident in the left kidney. No left hydronephrosis. The urinary bladder appears normal for the degree of distention. Stomach/Bowel: Stomach is nondistended. No gastric wall thickening. No evidence of outlet obstruction. Duodenum is normally positioned as is the ligament of Treitz. No small bowel wall thickening. No small bowel dilatation. The terminal ileum is normal. The appendix is normal.  No gross colonic mass. No colonic wall thickening. No substantial diverticular change. Vascular/Lymphatic: There is abdominal aortic atherosclerosis without aneurysm. There is no gastrohepatic or hepatoduodenal ligament lymphadenopathy. No intraperitoneal or retroperitoneal lymphadenopathy. No pelvic sidewall lymphadenopathy. Reproductive: The prostate gland and seminal vesicles have normal imaging features. Other: No intraperitoneal free fluid. Musculoskeletal: Bone windows reveal no worrisome lytic or sclerotic osseous lesions. IMPRESSION: Moderate right hydroureteronephrosis secondary to a right 1-2 mm ureteral stone at the level of the common right iliac vessels. Liver morphology consistent with cirrhosis. Abdominal aortic atherosclerosis. Electronically Signed   By: Kennith Center M.D.   On: 02/10/2015 14:08   I have personally reviewed and evaluated these images and lab results as part of my medical decision-making.   EKG Interpretation None      MDM   Final diagnoses:  Right flank pain  Nephrolithiasis    55 yo M with right flank and right upper quadrant pain. Due to pain been an atypical location and patient having multiple interventions for kidney stones in the past a CT stone study was ordered. Found to have a 1-2 mm kidney stone with mild right-sided hydro-. Pain is significantly controlled. Will give him oral medications and have him follow-up with his urologist.  3:33 PM:  I have discussed the diagnosis/risks/treatment options with the patient and family and believe the pt to be eligible for discharge home to follow-up with Urology. We also discussed returning to the ED immediately if new or worsening sx occur. We discussed the sx which are most concerning (e.g., sudden worsening pain, fever, inability to tolerate by mouth) that necessitate immediate return. Medications administered to the patient during their visit and any new prescriptions provided to the patient are listed  below.  Medications given during this visit Medications  sodium chloride 0.9 % bolus 1,000 mL (0 mLs Intravenous Stopped 02/10/15 1504)  morphine 4 MG/ML injection 8 mg (8 mg Intravenous Given 02/10/15 1410)  ketorolac (TORADOL) 30 MG/ML injection 30 mg (30 mg Intravenous Given 02/10/15 1413)  morphine 4 MG/ML injection 4 mg (4 mg Intravenous Given 02/10/15 1504)    Discharge Medication List as of 02/10/2015  2:39 PM    START taking these medications   Details  oxyCODONE (ROXICODONE) 5 MG immediate release tablet Take 1 tablet (5 mg total) by mouth every 4 (four) hours as needed for severe pain., Starting 02/10/2015, Until Discontinued, Print    tamsulosin (FLOMAX) 0.4 MG CAPS capsule Take 1 capsule (0.4 mg total) by mouth daily after supper., Starting 02/10/2015, Until Discontinued, Print        The patient appears reasonably screen and/or stabilized  for discharge and I doubt any other medical condition or other [facility] requiring further screening, evaluation, or treatment in the ED at this time prior to discharge.      Melene Plan, DO 02/10/15 1533

## 2015-10-14 ENCOUNTER — Emergency Department (HOSPITAL_BASED_OUTPATIENT_CLINIC_OR_DEPARTMENT_OTHER)
Admission: EM | Admit: 2015-10-14 | Discharge: 2015-10-15 | Disposition: A | Discharge status: Home / Self Care | Payer: Medicaid Other | Attending: Emergency Medicine | Admitting: Emergency Medicine

## 2015-10-14 ENCOUNTER — Encounter (HOSPITAL_BASED_OUTPATIENT_CLINIC_OR_DEPARTMENT_OTHER): Payer: Self-pay | Admitting: Respiratory Therapy

## 2015-10-14 ENCOUNTER — Emergency Department (HOSPITAL_BASED_OUTPATIENT_CLINIC_OR_DEPARTMENT_OTHER): Payer: Medicaid Other

## 2015-10-14 DIAGNOSIS — I159 Secondary hypertension, unspecified: Secondary | ICD-10-CM | POA: Insufficient documentation

## 2015-10-14 DIAGNOSIS — R51 Headache: Secondary | ICD-10-CM

## 2015-10-14 DIAGNOSIS — F17210 Nicotine dependence, cigarettes, uncomplicated: Secondary | ICD-10-CM | POA: Insufficient documentation

## 2015-10-14 DIAGNOSIS — E119 Type 2 diabetes mellitus without complications: Secondary | ICD-10-CM | POA: Insufficient documentation

## 2015-10-14 DIAGNOSIS — R519 Headache, unspecified: Secondary | ICD-10-CM

## 2015-10-14 MED ORDER — SODIUM CHLORIDE 0.9 % IV BOLUS (SEPSIS)
1000.0000 mL | Freq: Once | INTRAVENOUS | Status: AC
  Administered 2015-10-15: 1000 mL via INTRAVENOUS

## 2015-10-14 MED ORDER — METOCLOPRAMIDE HCL 5 MG/ML IJ SOLN
10.0000 mg | Freq: Once | INTRAMUSCULAR | Status: AC
  Administered 2015-10-14: 10 mg via INTRAVENOUS
  Filled 2015-10-14: qty 2

## 2015-10-14 MED ORDER — KETOROLAC TROMETHAMINE 30 MG/ML IJ SOLN
30.0000 mg | Freq: Once | INTRAMUSCULAR | Status: AC
  Administered 2015-10-14: 30 mg via INTRAVENOUS
  Filled 2015-10-14: qty 1

## 2015-10-14 MED ORDER — DIPHENHYDRAMINE HCL 50 MG/ML IJ SOLN
50.0000 mg | Freq: Once | INTRAMUSCULAR | Status: AC
  Administered 2015-10-14: 50 mg via INTRAVENOUS
  Filled 2015-10-14: qty 1

## 2015-10-14 NOTE — ED Triage Notes (Signed)
Pt reports neck pain that started several days ago on the right side of his neck radiating up into his head and down into his shoulder blade.  Pt reports intermittent left hand tingling over the past 5 days.

## 2015-10-14 NOTE — ED Provider Notes (Signed)
[facility] Provider Note   CSN: [id number] Arrival date & time: 10/14/15  2040  By signing my name below, I, Modena JanskyAlbert Thayil, attest that this documentation has been prepared under the direction and in the presence of Tomasita CrumbleAdeleke Arvie Villarruel, MD . Electronically Signed: Modena JanskyAlbert Thayil, Scribe. 10/14/2015. 11:11 PM.  History   Chief Complaint Chief Complaint  Patient presents with  . Headache   The history is provided by the patient. No language interpreter was used.   HPI Comments: Michael Carroll is a 55 y.o. male with a hx of TIA who presents to the Emergency Department complaining of constant moderate migraine that started 4 days ago. He report associated intermittent moderate right-sided neck pain, photophobia, trouble with memory. Pain is exacerbated by light and certain positions. He took ibuprofen without any relief. Denies any recent trauma, or BLE weakness.   Past Medical History:  Diagnosis Date  . At risk for sleep apnea    STOP-BANG=  4     . Cirrhosis of liver without mention of alcohol    PER LIVER BX 03/2012  . Headache(784.0)    migraines  . History of kidney stones   . History of migraine   . Hypertension    NO MEDS  . Productive cough   . Right ureteral stone   . Smokers' cough (HCC)   . Type 2 diabetes, diet controlled [facility](HCC)     Patient Active Problem List   Diagnosis Date Noted  . Chronic cholecystitis with calculus 03/26/2012    Past Surgical History:  Procedure Laterality Date  . CARDIOVASCULAR STRESS TEST  2004   CARDIOLITE STUDY NORMAL/  EF 60%  . CHOLECYSTECTOMY N/A 03/26/2012   Procedure: LAPAROSCOPIC CHOLECYSTECTOMY;  Surgeon: Romie LeveeAlicia Thomas, MD;  Location: [facility];  Service: General;  Laterality: N/A;  . CYSTO/   RIGHT URETERAL STENT EXCHANGE  12/2007  . CYSTO/  RIGHT URETERAL STENT PLACEMENT  06/2006  . CYSTOSCOPY  07/11/2013   Procedure: CYSTOSCOPY RIGHT STENT REMOVAL;  Surgeon: Crist FatBenjamin W Herrick, MD;  Location: [facility];  Service: Urology;;    . Bluford KaufmannYSTOSCOPY WITH RETROGRADE PYELOGRAM, URETEROSCOPY AND STENT PLACEMENT Right 05/15/2013   Procedure: CYSTOSCOPY WITH RETROGRADE PYELOGRAM, URETEROSCOPY AND STENT PLACEMENT;  Surgeon: Crist FatBenjamin W Herrick, MD;  Location: [facility];  Service: Urology;  Laterality: Right;  . CYSTOSCOPY WITH RETROGRADE PYELOGRAM, URETEROSCOPY AND STENT PLACEMENT Right 05/30/2013   Procedure: CYSTOSCOPY WITH RIGHT RETROGRADE PYELOGRAM, URETEROSCOPY LASER LITHOTRIPSY AND STENT EXCHANGE ;  Surgeon: Crist FatBenjamin W Herrick, MD;  [facility];  Service: Urology;  Laterality: Right;  . EXTRACORPOREAL SHOCK WAVE LITHOTRIPSY    . HOLMIUM LASER APPLICATION Right 05/30/2013   Procedure: HOLMIUM LASER APPLICATION;  Surgeon: Crist FatBenjamin W Herrick, MD;  [facility];  Service: Urology;  Laterality: Right;  . LIVER BIOPSY N/A 03/26/2012   Procedure: LIVER BIOPSY;  Surgeon: Romie LeveeAlicia Thomas, MD;  Location: [facility];  Service: General;  Laterality: N/A;       Home Medications    Prior to Admission medications   Not on File    Family History Family History  Problem Relation Age of Onset  . Cancer - Lung Mother   . Cancer Mother   . Seizures Sister   . Heart disease Father     Social History Social History  Substance Use Topics  . Smoking status: Current Every Day Smoker    Packs/day: 1.50    Years: 25.00    Types: Cigarettes  . Smokeless  tobacco: Never Used  . Alcohol use No     Allergies   Zofran [ondansetron hcl] and Compazine   Review of Systems Review of Systems  10 Systems reviewed and all are negative for acute change except as noted in the HPI.  Physical Exam Updated Vital Signs BP (!) 188/124 (BP Location: Left Arm)   Pulse 95   Temp 98.3 F (36.8 C) (Oral)   Resp 18   Ht 6' (1.829 m)   Wt 258 lb (117 kg)   SpO2 97%   BMI 34.99 kg/m   Physical Exam  Constitutional: He is oriented to person, place, and time. Vital signs are normal. He appears  well-developed and well-nourished.  Non-toxic appearance. He does not appear ill. No distress.  HENT:  Head: Normocephalic and atraumatic.  Nose: Nose normal.  Mouth/Throat: Oropharynx is clear and moist. No oropharyngeal exudate.  Eyes: Conjunctivae and EOM are normal. Pupils are equal, round, and reactive to light. No scleral icterus.  Neck: Normal range of motion. Neck supple. No tracheal deviation, no edema, no erythema and normal range of motion present. No thyroid mass and no thyromegaly present.  Cardiovascular: Normal rate, regular rhythm, S1 normal, S2 normal, normal heart sounds, intact distal pulses and normal pulses.  Exam reveals no gallop and no friction rub.   No murmur heard. Pulmonary/Chest: Effort normal and breath sounds normal. No respiratory distress. He has no wheezes. He has no rhonchi. He has no rales.  Abdominal: Soft. Normal appearance and bowel sounds are normal. He exhibits no distension, no ascites and no mass. There is no hepatosplenomegaly. There is no tenderness. There is no rebound, no guarding and no CVA tenderness.  Musculoskeletal: Normal range of motion. He exhibits no edema or tenderness.  Lymphadenopathy:    He has no cervical adenopathy.  Neurological: He is alert and oriented to person, place, and time. He has normal strength. No cranial nerve deficit or sensory deficit.  Normal strength and sensation in al extremities. Normal cerebellar testing.   Skin: Skin is warm, dry and intact. No petechiae and no rash noted. He is not diaphoretic. No erythema. No pallor.  Nursing note and vitals reviewed.    ED Treatments / Results  DIAGNOSTIC STUDIES: Oxygen Saturation is 97% on RA, normal by my interpretation.    COORDINATION OF CARE: 11:15 PM- Pt advised of plan for treatment and pt agrees.  Labs (all labs ordered are listed, but only abnormal results are displayed) Labs Reviewed  CBC WITH DIFFERENTIAL/PLATELET - Abnormal; Notable for the following:        Result Value   Platelets 125 (*)    All other components within normal limits  BASIC METABOLIC PANEL - Abnormal; Notable for the following:    Glucose, Bld 145 (*)    BUN 23 (*)    All other components within normal limits    EKG  EKG Interpretation None       Radiology Ct Angio Head W Or Wo Contrast  Result Date: 10/15/2015 CLINICAL DATA:  RIGHT head and neck pain for 5 days. LEFT hand tingling. History of cirrhosis, hypertension, diabetes and migraines. EXAM: CT ANGIOGRAPHY HEAD AND NECK TECHNIQUE: Multidetector CT imaging of the head and neck was performed using the standard protocol during bolus administration of intravenous contrast. Multiplanar CT image reconstructions and MIPs were obtained to evaluate the vascular anatomy. Carotid stenosis measurements (when applicable) are obtained utilizing NASCET criteria, using the distal internal carotid diameter as the denominator. CONTRAST:  100  cc Isovue 370 COMPARISON:  None. FINDINGS: CT HEAD BRAIN: The ventricles and sulci are normal. No intraparenchymal hemorrhage, mass effect nor midline shift. No acute large vascular territory infarcts. No abnormal extra-axial fluid collections. Basal cisterns are patent. VASCULAR: Minimal calcific atherosclerosis the carotid siphons. SKULL/SOFT TISSUES: No skull fracture. No significant soft tissue swelling. ORBITS/SINUSES: The included ocular globes and orbital contents are normal.The mastoid aircells and included paranasal sinuses are well-aerated. OTHER: Poor dentition. CTA NECK AORTIC ARCH: Normal appearance of the thoracic arch, normal branch pattern. The origins of the innominate, left Common carotid artery and subclavian artery are widely patent. RIGHT CAROTID SYSTEM: Common carotid artery is widely patent, coursing in a straight line fashion. Normal appearance of the carotid bifurcation without hemodynamically significant stenosis by NASCET criteria. Trace eccentric calcific atherosclerosis.  Normal appearance of the included internal carotid artery. LEFT CAROTID SYSTEM: Common carotid artery is widely patent, coursing in a straight line fashion. Normal appearance of the carotid bifurcation without hemodynamically significant stenosis by NASCET criteria. Trace eccentric calcific atherosclerosis. Normal appearance of the included internal carotid artery. VERTEBRAL ARTERIES:Codominant vertebral artery's. Normal appearance of the vertebral arteries, which appear widely patent. SKELETON: No acute osseous process though bone windows have not been submitted. No osseous canal stenosis. No high-grade neural foraminal stenosis. OTHER NECK: Soft tissues of the neck are non-acute though, not tailored for evaluation. CTA HEAD ANTERIOR CIRCULATION: Patent cervical internal carotid arteries, petrous, cavernous and supra clinoid internal carotid arteries. Moderate stenosis and luminal irregularity RIGHT supraclinoid internal carotid artery associated with intimal thickening calcific atherosclerosis. Widely patent anterior communicating artery. Normal appearance of the anterior and middle cerebral arteries. No large vessel occlusion, hemodynamically significant stenosis, dissection, luminal irregularity, contrast extravasation or aneurysm. POSTERIOR CIRCULATION: Normal appearance of the vertebral arteries, vertebrobasilar junction and basilar artery, as well as main branch vessels. Normal appearance of the posterior cerebral arteries. No large vessel occlusion, hemodynamically significant stenosis, dissection, luminal irregularity, contrast extravasation or aneurysm. VENOUS SINUSES: Major dural venous sinuses are patent though not tailored for evaluation on this angiographic examination. ANATOMIC VARIANTS: None. DELAYED PHASE: No abnormal intracranial enhancement. IMPRESSION: CT HEAD:  Negative. CTA NECK: Atherosclerosis without hemodynamically significant stenosis or acute vascular process. CTA HEAD: Moderate stenosis  RIGHT supraclinoid internal carotid artery without emergent large vessel occlusion. Electronically Signed   By: Awilda Metro M.D.   On: 10/15/2015 01:46   Ct Angio Neck W Or Wo Contrast  Result Date: 10/15/2015 CLINICAL DATA:  RIGHT head and neck pain for 5 days. LEFT hand tingling. History of cirrhosis, hypertension, diabetes and migraines. EXAM: CT ANGIOGRAPHY HEAD AND NECK TECHNIQUE: Multidetector CT imaging of the head and neck was performed using the standard protocol during bolus administration of intravenous contrast. Multiplanar CT image reconstructions and MIPs were obtained to evaluate the vascular anatomy. Carotid stenosis measurements (when applicable) are obtained utilizing NASCET criteria, using the distal internal carotid diameter as the denominator. CONTRAST:  100 cc Isovue 370 COMPARISON:  None. FINDINGS: CT HEAD BRAIN: The ventricles and sulci are normal. No intraparenchymal hemorrhage, mass effect nor midline shift. No acute large vascular territory infarcts. No abnormal extra-axial fluid collections. Basal cisterns are patent. VASCULAR: Minimal calcific atherosclerosis the carotid siphons. SKULL/SOFT TISSUES: No skull fracture. No significant soft tissue swelling. ORBITS/SINUSES: The included ocular globes and orbital contents are normal.The mastoid aircells and included paranasal sinuses are well-aerated. OTHER: Poor dentition. CTA NECK AORTIC ARCH: Normal appearance of the thoracic arch, normal branch pattern. The origins of the innominate, left Common  carotid artery and subclavian artery are widely patent. RIGHT CAROTID SYSTEM: Common carotid artery is widely patent, coursing in a straight line fashion. Normal appearance of the carotid bifurcation without hemodynamically significant stenosis by NASCET criteria. Trace eccentric calcific atherosclerosis. Normal appearance of the included internal carotid artery. LEFT CAROTID SYSTEM: Common carotid artery is widely patent, coursing in a  straight line fashion. Normal appearance of the carotid bifurcation without hemodynamically significant stenosis by NASCET criteria. Trace eccentric calcific atherosclerosis. Normal appearance of the included internal carotid artery. VERTEBRAL ARTERIES:Codominant vertebral artery's. Normal appearance of the vertebral arteries, which appear widely patent. SKELETON: No acute osseous process though bone windows have not been submitted. No osseous canal stenosis. No high-grade neural foraminal stenosis. OTHER NECK: Soft tissues of the neck are non-acute though, not tailored for evaluation. CTA HEAD ANTERIOR CIRCULATION: Patent cervical internal carotid arteries, petrous, cavernous and supra clinoid internal carotid arteries. Moderate stenosis and luminal irregularity RIGHT supraclinoid internal carotid artery associated with intimal thickening calcific atherosclerosis. Widely patent anterior communicating artery. Normal appearance of the anterior and middle cerebral arteries. No large vessel occlusion, hemodynamically significant stenosis, dissection, luminal irregularity, contrast extravasation or aneurysm. POSTERIOR CIRCULATION: Normal appearance of the vertebral arteries, vertebrobasilar junction and basilar artery, as well as main branch vessels. Normal appearance of the posterior cerebral arteries. No large vessel occlusion, hemodynamically significant stenosis, dissection, luminal irregularity, contrast extravasation or aneurysm. VENOUS SINUSES: Major dural venous sinuses are patent though not tailored for evaluation on this angiographic examination. ANATOMIC VARIANTS: None. DELAYED PHASE: No abnormal intracranial enhancement. IMPRESSION: CT HEAD:  Negative. CTA NECK: Atherosclerosis without hemodynamically significant stenosis or acute vascular process. CTA HEAD: Moderate stenosis RIGHT supraclinoid internal carotid artery without emergent large vessel occlusion. Electronically Signed   By: Awilda Metro M.D.    On: 10/15/2015 01:46    Procedures Procedures (including critical care time)  Medications Ordered in ED Medications  sodium chloride 0.9 % bolus 1,000 mL (1,000 mLs Intravenous New Bag/Given 10/15/15 0002)  ketorolac (TORADOL) 30 MG/ML injection 30 mg (30 mg Intravenous Given 10/14/15 2358)  metoCLOPramide (REGLAN) injection 10 mg (10 mg Intravenous Given 10/14/15 2358)  diphenhydrAMINE (BENADRYL) injection 50 mg (50 mg Intravenous Given 10/14/15 2358)  iopamidol (ISOVUE-370) 76 % injection 100 mL (100 mLs Intravenous Contrast Given 10/15/15 0031)     Initial Impression / Assessment and Plan / ED Course  I have reviewed the triage vital signs and the nursing notes.  Pertinent labs & imaging results that were available during my care of the patient were reviewed by me and considered in my medical decision making (see chart for details).  Clinical Course    Patient presents emergency department for head and neck pain with neurological symptoms in his right upper extremity. He states this is different from his normal headache. It is gradually gotten worse. Pain is also made worse with movement of his head, this is reassuring. Will obtain CTA of the head and neck for evaluation for possible dissection or intracranial abnormality. He was given Toradol, Reglan, Benadryl for his headache.   2:34 AM upon repeat evaluation, patient states his headache is resolved. We'll discharge home with Reglan to take as needed. CT scan does reveal moderate stenosis of the right internal carotid artery, this information was relayed to the patient.  Make a follow-up advised. He appears well and in no acute distress, vital signs were within his normal limits and he is safe for discharge.  Final Clinical Impressions(s) / ED Diagnoses   Final  diagnoses:  None    New Prescriptions New Prescriptions   No medications on file   I personally performed the services described in this documentation, which was  scribed in my presence. The recorded information has been reviewed and is accurate.       Tomasita Crumble, MD 10/15/15 [phone]

## 2015-10-15 LAB — CBC WITH DIFFERENTIAL/PLATELET
BASOS PCT: 1 %
Basophils Absolute: 0 10*3/uL (ref 0.0–0.1)
EOS PCT: 2 %
Eosinophils Absolute: 0.1 10*3/uL (ref 0.0–0.7)
HEMATOCRIT: 47.5 % (ref 39.0–52.0)
Hemoglobin: 16.8 g/dL (ref 13.0–17.0)
Lymphocytes Relative: 33 %
Lymphs Abs: 2.4 10*3/uL (ref 0.7–4.0)
MCH: 29.7 pg (ref 26.0–34.0)
MCHC: 35.4 g/dL (ref 30.0–36.0)
MCV: 83.9 fL (ref 78.0–100.0)
MONO ABS: 0.7 10*3/uL (ref 0.1–1.0)
MONOS PCT: 10 %
NEUTROS ABS: 3.9 10*3/uL (ref 1.7–7.7)
Neutrophils Relative %: 54 %
PLATELETS: 125 10*3/uL — AB (ref 150–400)
RBC: 5.66 MIL/uL (ref 4.22–5.81)
RDW: 13 % (ref 11.5–15.5)
WBC: 7.1 10*3/uL (ref 4.0–10.5)

## 2015-10-15 LAB — BASIC METABOLIC PANEL
Anion gap: 5 (ref 5–15)
BUN: 23 mg/dL — ABNORMAL HIGH (ref 6–20)
CALCIUM: 9.3 mg/dL (ref 8.9–10.3)
CO2: 29 mmol/L (ref 22–32)
CREATININE: 0.92 mg/dL (ref 0.61–1.24)
Chloride: 104 mmol/L (ref 101–111)
GLUCOSE: 145 mg/dL — AB (ref 65–99)
Potassium: 3.8 mmol/L (ref 3.5–5.1)
Sodium: 138 mmol/L (ref 135–145)

## 2015-10-15 MED ORDER — IOPAMIDOL (ISOVUE-370) INJECTION 76%
100.0000 mL | Freq: Once | INTRAVENOUS | Status: AC | PRN
  Administered 2015-10-15: 100 mL via INTRAVENOUS

## 2015-10-15 MED ORDER — METOCLOPRAMIDE HCL 10 MG PO TABS: 10.0000 mg | ORAL_TABLET | Freq: Three times a day (TID) | ORAL | 0 refills | Status: DC | PRN

## 2016-01-06 IMAGING — CT CT ABD-PELV W/O CM
2 of 4 series · 16 of 46 positions shown, 18 images · non-contrast
Comparison: Prior CT abdomen/ pelvis 11/30/2012

CLINICAL DATA: Right flank pain, hematuria

EXAM:
CT ABDOMEN AND PELVIS WITHOUT CONTRAST
TECHNIQUE: Multidetector CT imaging of the abdomen and pelvis was performed
following the standard protocol without intravenous contrast.

[Series 2: renal stone > 200 lbs 5.0 b31f · axial · 0.90mm/px · z∈[-660,-200]mm · 13 of 102 slices shown, 15 images]
[im 5/102  soft-tissue]
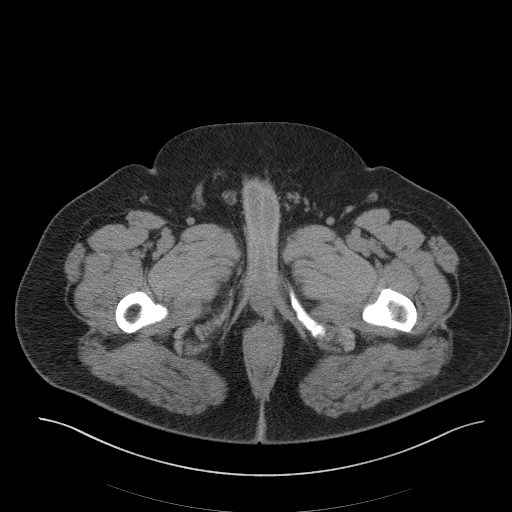
[im 5/102  bone]
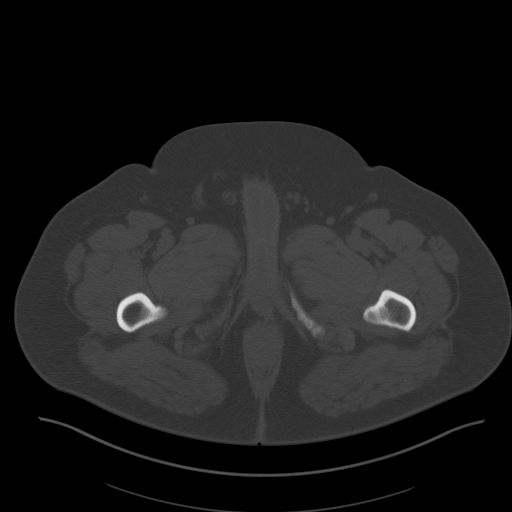
[im 13/102  soft-tissue]
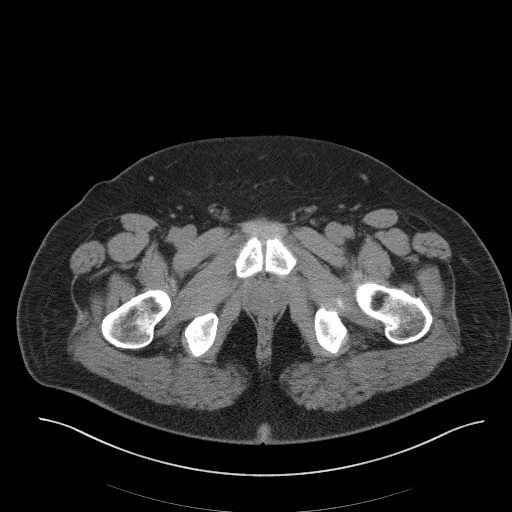
[im 22/102  soft-tissue]
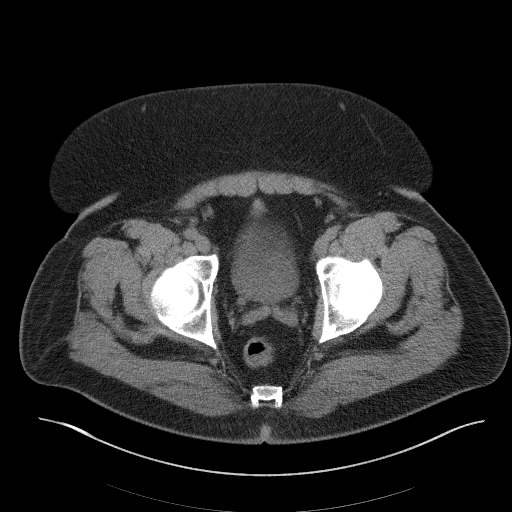
[im 30/102  soft-tissue]
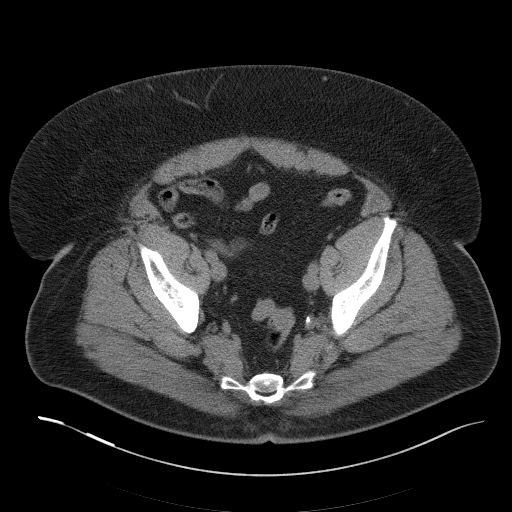
[im 34/102  soft-tissue]
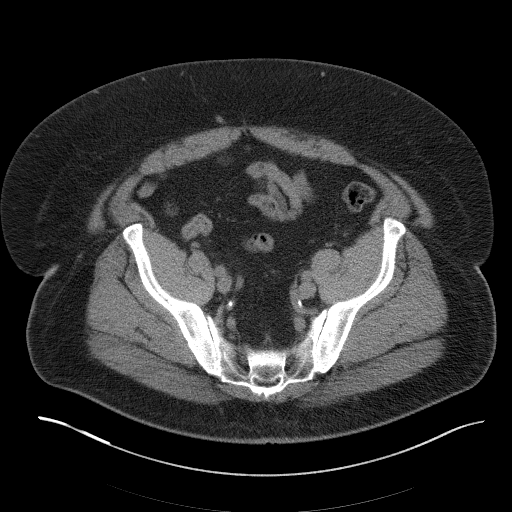
[im 43/102  soft-tissue]
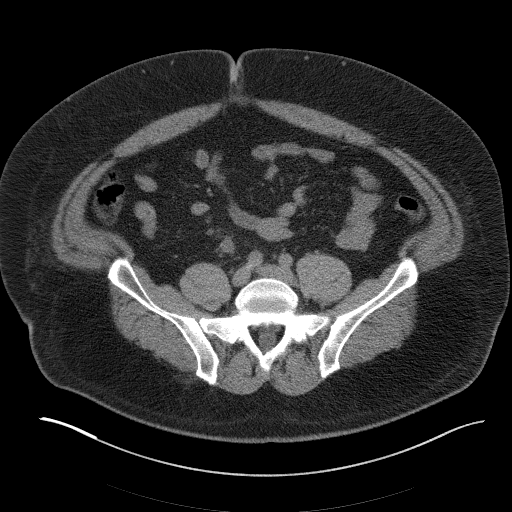
[im 51/102  soft-tissue]
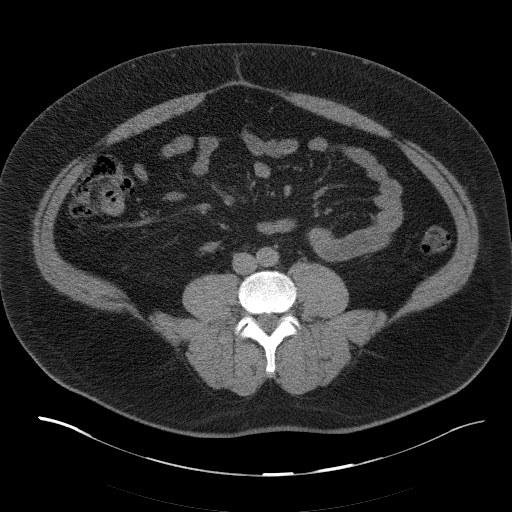
[im 59/102  soft-tissue]
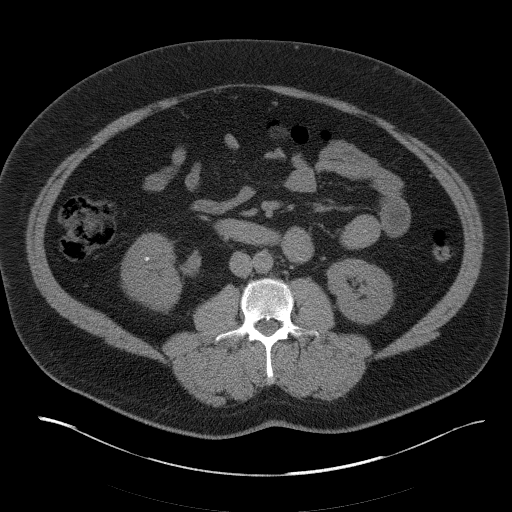
[im 68/102  soft-tissue]
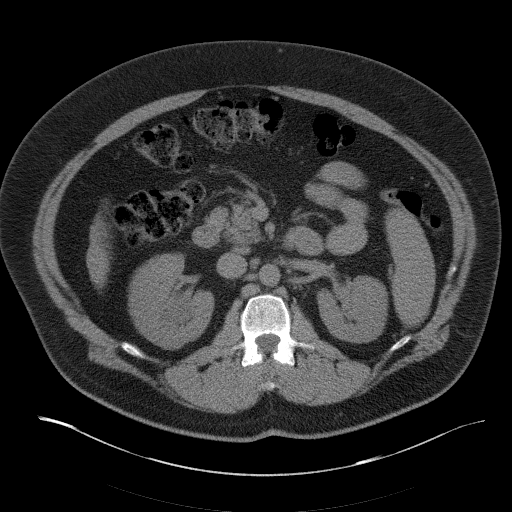
[im 68/102  bone]
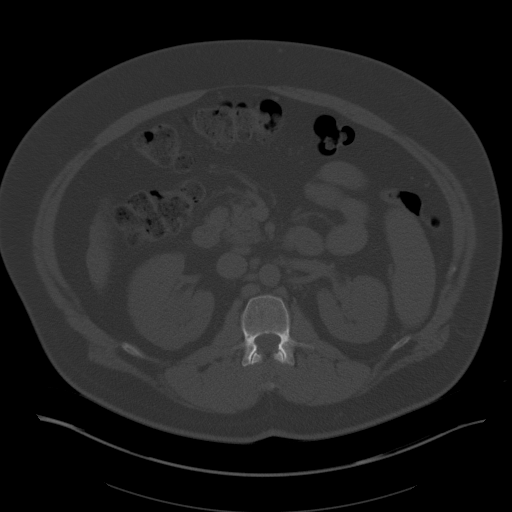
[im 72/102  soft-tissue]
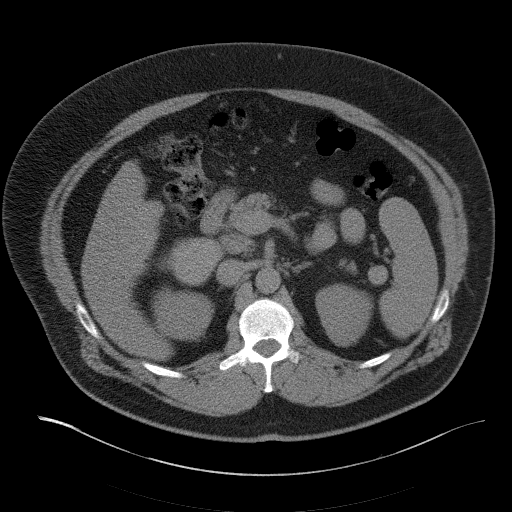
[im 80/102  soft-tissue]
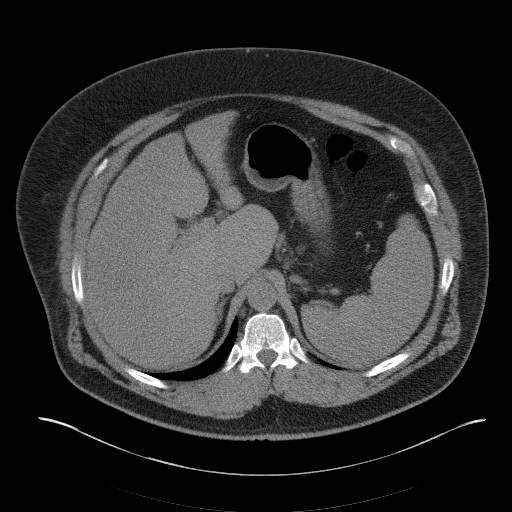
[im 89/102  soft-tissue]
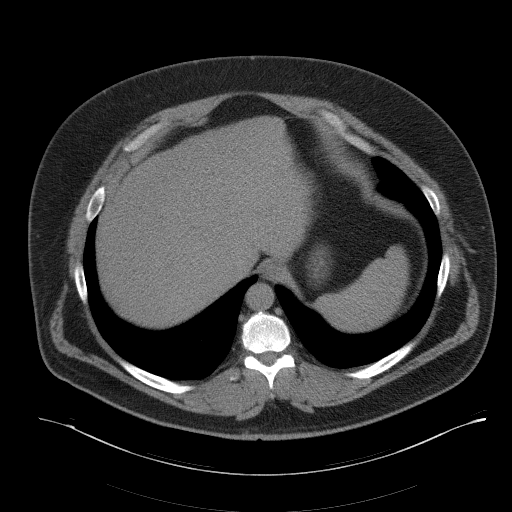
[im 97/102  soft-tissue]
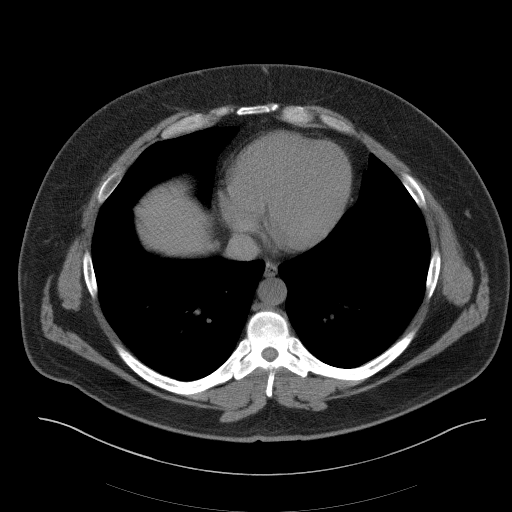

[Series 5: renal stone 3.0 coronal · coronal · 1.05mm/px · 3 of 118 slices shown]
[im 40/118  soft-tissue]
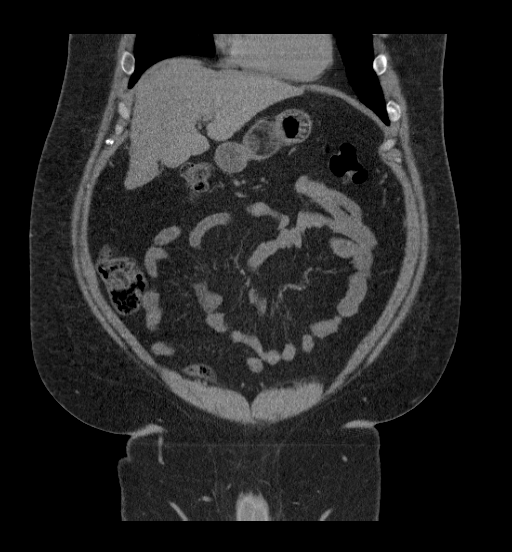
[im 53/118  soft-tissue]
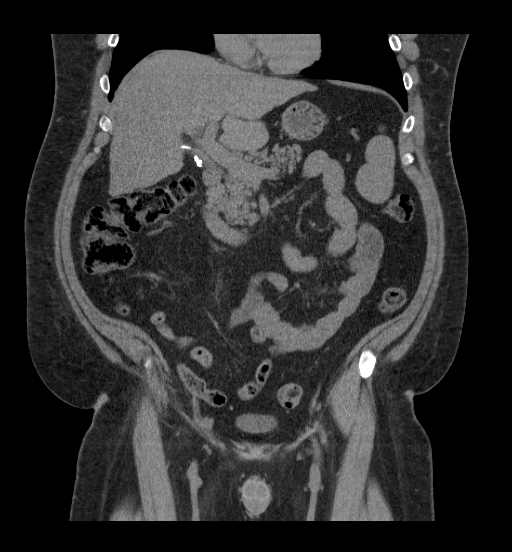
[im 66/118  soft-tissue]
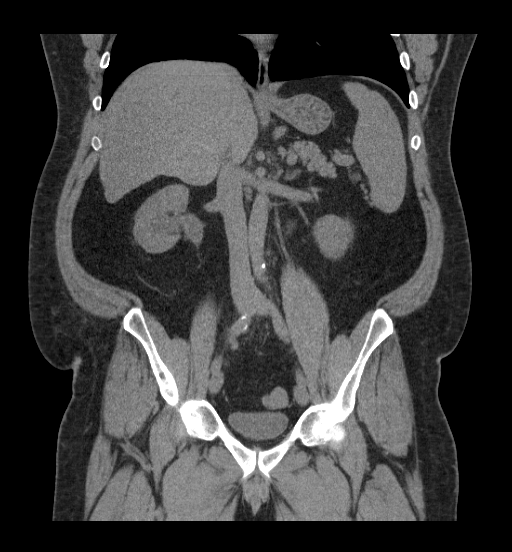

[16 of 46 positions shown; findings below may reference images not displayed]

FINDINGS: Lower Chest: The lung bases are clear. Visualized cardiac structures
are within normal limits for size. No pericardial effusion.
Unremarkable visualized distal thoracic esophagus.

Abdomen: Unenhanced CT was performed per clinician order. Lack of IV
contrast limits sensitivity and specificity, especially for
evaluation of abdominal/pelvic solid viscera. Within these
limitations, unremarkable CT appearance of the stomach, duodenum,
adrenal glands and pancreas. Abnormal hepatic morphology with marked
enlargement of the caudate lobe and a suggestion of a finely nodular
contour suggesting underlying hepatic cirrhosis. The gallbladder is
surgically absent. No intra or extrahepatic biliary ductal
dilatation. Stable 1.8 cm portacaval node. Borderline splenomegaly.

Moderate right hydronephrosis. There is a 5 mm stone in the lower
pole. Obstructing 6 x 6 mm stone in the distal ureter just proximal
to the iliac vessels. There is associated ureterectasis and
periureteral stranding.

No evidence of obstruction or focal bowel wall thickening. Normal
appendix in the right lower quadrant. The terminal ileum is
unremarkable. No free fluid or additional adenopathy.

Pelvis: Unremarkable bladder, prostate gland and seminal vesicles.
No free fluid or suspicious adenopathy.

Bones/Soft Tissues: No acute fracture or aggressive appearing lytic
or blastic osseous lesion.

Vascular: Limited evaluation in the absence of intravenous contrast.
Trace aortic atherosclerotic vascular calcifications. No aneurysmal
dilatation.
IMPRESSION: 1. Obstructing 6 mm distal right ureteral stone with associated
moderate right hydronephrosis.
2. An additional nonobstructing 5 mm stone is noted in the right
lower pole.
3. Hepatic cirrhosis. Borderline splenomegaly suggests developing
portal hypertension.

## 2016-08-02 ENCOUNTER — Emergency Department (HOSPITAL_BASED_OUTPATIENT_CLINIC_OR_DEPARTMENT_OTHER): Payer: Self-pay

## 2016-08-02 ENCOUNTER — Emergency Department (HOSPITAL_BASED_OUTPATIENT_CLINIC_OR_DEPARTMENT_OTHER)
Admission: EM | Admit: 2016-08-02 | Discharge: 2016-08-03 | Disposition: A | Discharge status: Home / Self Care | Payer: Self-pay | Attending: Emergency Medicine | Admitting: Emergency Medicine

## 2016-08-02 ENCOUNTER — Encounter (HOSPITAL_BASED_OUTPATIENT_CLINIC_OR_DEPARTMENT_OTHER): Payer: Self-pay | Admitting: *Deleted

## 2016-08-02 DIAGNOSIS — R109 Unspecified abdominal pain: Secondary | ICD-10-CM

## 2016-08-02 DIAGNOSIS — I10 Essential (primary) hypertension: Secondary | ICD-10-CM | POA: Insufficient documentation

## 2016-08-02 DIAGNOSIS — E119 Type 2 diabetes mellitus without complications: Secondary | ICD-10-CM | POA: Insufficient documentation

## 2016-08-02 DIAGNOSIS — N200 Calculus of kidney: Secondary | ICD-10-CM

## 2016-08-02 DIAGNOSIS — F17210 Nicotine dependence, cigarettes, uncomplicated: Secondary | ICD-10-CM | POA: Insufficient documentation

## 2016-08-02 LAB — URINALYSIS, ROUTINE W REFLEX MICROSCOPIC
Hgb urine dipstick: NEGATIVE
KETONES UR: 15 mg/dL — AB
LEUKOCYTES UA: NEGATIVE
NITRITE: NEGATIVE
PROTEIN: 30 mg/dL — AB
Specific Gravity, Urine: 1.028 (ref 1.005–1.030)
pH: 5 (ref 5.0–8.0)

## 2016-08-02 LAB — URINALYSIS, MICROSCOPIC (REFLEX): WBC UA: NONE SEEN WBC/hpf (ref 0–5)

## 2016-08-02 MED ORDER — HYDROMORPHONE HCL 1 MG/ML IJ SOLN
1.0000 mg | Freq: Once | INTRAMUSCULAR | Status: AC
  Administered 2016-08-02: 1 mg via INTRAVENOUS
  Filled 2016-08-02: qty 1

## 2016-08-02 MED ORDER — OXYCODONE-ACETAMINOPHEN 5-325 MG PO TABS: 1.0000 | ORAL_TABLET | Freq: Four times a day (QID) | ORAL | 0 refills | Status: DC | PRN

## 2016-08-02 MED ORDER — KETOROLAC TROMETHAMINE 30 MG/ML IJ SOLN
30.0000 mg | Freq: Once | INTRAMUSCULAR | Status: AC
  Administered 2016-08-02: 30 mg via INTRAVENOUS
  Filled 2016-08-02: qty 1

## 2016-08-02 MED ORDER — TAMSULOSIN HCL 0.4 MG PO CAPS: 0.4000 mg | ORAL_CAPSULE | Freq: Every day | ORAL | 0 refills | Status: DC

## 2016-08-02 NOTE — Discharge Instructions (Signed)
It was our pleasure to provide your ER care today - we hope that you feel better.  Rest. Drink plenty of fluids.  Strain urine. Take flomax as prescribed.  Take motrin or aleve as need for pain. You may also take percocet as need for pain. No driving for the next 6 hours or when taking percocet. Also, do not take tylenol or acetaminophen containing medication when taking percocet.   Follow up with urologist in the next few days - call office tomorrow AM to arrange appointment.  Also, your blood pressure is high - see hypertension instruction sheet, limit salt intake, and follow up with primary care doctor in the coming week for recheck.  Return to ER if worse, new symptoms, fevers, worsening or intractable pain, persistent vomiting, other concern.   You were given pain medicine in the ER - no driving for the next 6 hours.

## 2016-08-02 NOTE — ED Triage Notes (Signed)
Right flank pain. Abdominal bloating for a week.

## 2016-08-02 NOTE — ED Provider Notes (Signed)
[facility] Provider Note   CSN: [id number] Arrival date & time: 08/02/16  2034   By signing my name below, I, Soijett Blue, attest that this documentation has been prepared under the direction and in the presence of Cathren Laine, MD. Electronically Signed: Soijett Blue, ED Scribe. 08/02/16. 10:39 PM.  History   Chief Complaint Chief Complaint  Patient presents with  . Flank Pain    HPI Michael Carroll is a 56 y.o. male with a PMHx of HTN, DM, who presents to the Emergency Department complaining of bilateral flank pain, right > left onset 11 AM this morning. Pt reports associated abdominal bloating, intermittent hematuria, and intermittent dysuria. Pt has tried 3 ASA with his last dose being at 6:45 AM this morning with no relief of his symptoms. He notes that his current symptoms are similar to his past hx of kidney stones. Pt reports that he has had urethral stents placed due to his hx of kidney stones. Pt urologist is at [facility] and he is also followed at [facility] He denies vomiting, fever, and any other symptoms.   The history is provided by the patient. No language interpreter was used.    Past Medical History:  Diagnosis Date  . At risk for sleep apnea    STOP-BANG=  4     . Cirrhosis of liver without mention of alcohol    PER LIVER BX 03/2012  . Headache(784.0)    migraines  . History of kidney stones   . History of migraine   . Hypertension    NO MEDS  . Productive cough   . Right ureteral stone   . Smokers' cough (HCC)   . Type 2 diabetes, diet controlled [facility])     Patient Active Problem List   Diagnosis Date Noted  . Chronic cholecystitis with calculus 03/26/2012    Past Surgical History:  Procedure Laterality Date  . CARDIOVASCULAR STRESS TEST  2004   CARDIOLITE STUDY NORMAL/  EF 60%  . CHOLECYSTECTOMY N/A 03/26/2012   Procedure: LAPAROSCOPIC CHOLECYSTECTOMY;  Surgeon: Romie Levee, MD;  Location: [facility];  Service: General;   Laterality: N/A;  . CYSTO/   RIGHT URETERAL STENT EXCHANGE  12/2007  . CYSTO/  RIGHT URETERAL STENT PLACEMENT  06/2006  . CYSTOSCOPY  07/11/2013   Procedure: CYSTOSCOPY RIGHT STENT REMOVAL;  Surgeon: Crist Fat, MD;  Location: [facility];  Service: Urology;;  . Bluford Kaufmann WITH RETROGRADE PYELOGRAM, URETEROSCOPY AND STENT PLACEMENT Right 05/15/2013   Procedure: CYSTOSCOPY WITH RETROGRADE PYELOGRAM, URETEROSCOPY AND STENT PLACEMENT;  Surgeon: Crist Fat, MD;  Location: [facility];  Service: Urology;  Laterality: Right;  . CYSTOSCOPY WITH RETROGRADE PYELOGRAM, URETEROSCOPY AND STENT PLACEMENT Right 05/30/2013   Procedure: CYSTOSCOPY WITH RIGHT RETROGRADE PYELOGRAM, URETEROSCOPY LASER LITHOTRIPSY AND STENT EXCHANGE ;  Surgeon: Crist Fat, MD;  Location: [facility];  Service: Urology;  Laterality: Right;  . EXTRACORPOREAL SHOCK WAVE LITHOTRIPSY    . HOLMIUM LASER APPLICATION Right 05/30/2013   Procedure: HOLMIUM LASER APPLICATION;  Surgeon: Crist Fat, MD;  Location: [facility];  Service: Urology;  Laterality: Right;  . LIVER BIOPSY N/A 03/26/2012   Procedure: LIVER BIOPSY;  Surgeon: Romie Levee, MD;  Location: [facility];  Service: General;  Laterality: N/A;       Home Medications    Prior to Admission medications   Medication Sig Start Date End Date Taking? Authorizing Provider  metoCLOPramide (REGLAN) 10 MG tablet Take 1 tablet (10 mg  total) by mouth every 8 (eight) hours as needed (headache). 10/15/15   Tomasita Crumbleni, Adeleke, MD    Family History Family History  Problem Relation Age of Onset  . Cancer - Lung Mother   . Cancer Mother   . Seizures Sister   . Heart disease Father     Social History Social History  Substance Use Topics  . Smoking status: Current Every Day Smoker    Packs/day: 1.50    Years: 25.00    Types: Cigarettes  . Smokeless tobacco: Never Used  . Alcohol use No     Allergies   Zofran [ondansetron hcl] and  Compazine   Review of Systems Review of Systems  Constitutional: Negative for fever.  HENT: Negative for sore throat.   Eyes: Negative for redness.  Respiratory: Negative for shortness of breath.   Cardiovascular: Negative for chest pain.  Gastrointestinal: Negative for vomiting.  Endocrine: Negative for polyuria.  Genitourinary: Positive for flank pain (bilateral, right > left) and hematuria.  Musculoskeletal: Positive for back pain.  Skin: Negative for rash.  Neurological: Negative for headaches.  Hematological: Negative for adenopathy.  Psychiatric/Behavioral: Negative for confusion.     Physical Exam Updated Vital Signs BP (!) 181/105   Pulse (!) 118   Temp 98.8 F (37.1 C) (Oral)   Resp 20   Ht 6' (1.829 m)   Wt 286 lb 9.6 oz (130 kg)   SpO2 96%   BMI 38.87 kg/m   Physical Exam  Constitutional: He appears well-developed and well-nourished. No distress.  HENT:  Head: Normocephalic and atraumatic.  Eyes: Conjunctivae are normal.  Neck: Neck supple.  Cardiovascular: Normal rate, regular rhythm and normal heart sounds.  Exam reveals no gallop and no friction rub.   No murmur heard. Pulmonary/Chest: Effort normal and breath sounds normal. No respiratory distress. He has no wheezes. He has no rales.  Abdominal: Soft. Bowel sounds are normal. He exhibits no distension and no mass. There is no tenderness. There is no rebound and no guarding. No hernia.  Genitourinary:  Genitourinary Comments: No cva tenderness  Musculoskeletal: He exhibits no edema.  Neurological: He is alert.  Skin: Skin is warm and dry. No rash noted. He is not diaphoretic.  Psychiatric: He has a normal mood and affect. His behavior is normal.  Nursing note and vitals reviewed.    ED Treatments / Results  DIAGNOSTIC STUDIES: Oxygen Saturation is 96% on RA, nl by my interpretation.    COORDINATION OF CARE: 9:07 PM Discussed treatment plan with pt at bedside and pt agreed to  plan.   Labs (all labs ordered are listed, but only abnormal results are displayed) Results for orders placed or performed during the hospital encounter of 08/02/16  Urinalysis, Routine w reflex microscopic  Result Value Ref Range   Color, Urine ORANGE (A) YELLOW   APPearance CLOUDY (A) CLEAR   Specific Gravity, Urine 1.028 1.005 - 1.030   pH 5.0 5.0 - 8.0   Glucose, UA >=500 (A) NEGATIVE mg/dL   Hgb urine dipstick NEGATIVE NEGATIVE   Bilirubin Urine SMALL (A) NEGATIVE   Ketones, ur 15 (A) NEGATIVE mg/dL   Protein, ur 30 (A) NEGATIVE mg/dL   Nitrite NEGATIVE NEGATIVE   Leukocytes, UA NEGATIVE NEGATIVE  Urinalysis, Microscopic (reflex)  Result Value Ref Range   RBC / HPF 0-5 0 - 5 RBC/hpf   WBC, UA NONE SEEN 0 - 5 WBC/hpf   Bacteria, UA FEW (A) NONE SEEN   Squamous Epithelial / LPF 6-30 (  A) NONE SEEN   Mucous PRESENT    Hyaline Casts, UA PRESENT    Ca Oxalate Crys, UA PRESENT    US Renal  Result Date: 08/02/2016 CLINICAL DATA:  56 year old male with bilateral flank pain for 1 week. History of kidney stones. EXAM: RENAL / URINARY TRACT ULTRASOUND COMPLETE COMPARISON:  Abdominal CT dated 02/10/2015 FINDINGS: Right Kidney: Length: 13.1 cm. There is a mild right hydronephrosis. No echogenic stone identified. The renal echogenicity appears unremarkable. There is lobulated appearance of the renal cortex. Left Kidney: Length: 12.9 cm. Normal echogenicity. No hydronephrosis or echogenic stone. Bladder: The urinary bladder is unremarkable with degree of distention. Left ureteral jets noted. A right ureteral jet is not visualized. IMPRESSION: Mild right hydronephrosis with nonvisualization of the right ureteral jet. No stone identified in the right kidney. Findings concerning for right ureteral obstruction, likely secondary to a ureteral stone. CT may provide better evaluation if clinically indicated. Electronically Signed   By: Elgie Collard M.D.   On: 08/02/2016 22:37    EKG  EKG  Interpretation None       Radiology US Renal  Result Date: 08/02/2016 CLINICAL DATA:  56 year old male with bilateral flank pain for 1 week. History of kidney stones. EXAM: RENAL / URINARY TRACT ULTRASOUND COMPLETE COMPARISON:  Abdominal CT dated 02/10/2015 FINDINGS: Right Kidney: Length: 13.1 cm. There is a mild right hydronephrosis. No echogenic stone identified. The renal echogenicity appears unremarkable. There is lobulated appearance of the renal cortex. Left Kidney: Length: 12.9 cm. Normal echogenicity. No hydronephrosis or echogenic stone. Bladder: The urinary bladder is unremarkable with degree of distention. Left ureteral jets noted. A right ureteral jet is not visualized. IMPRESSION: Mild right hydronephrosis with nonvisualization of the right ureteral jet. No stone identified in the right kidney. Findings concerning for right ureteral obstruction, likely secondary to a ureteral stone. CT may provide better evaluation if clinically indicated. Electronically Signed   By: Elgie Collard M.D.   On: 08/02/2016 22:37    Procedures Procedures (including critical care time)  Medications Ordered in ED Medications - No data to display   Initial Impression / Assessment and Plan / ED Course  I have reviewed the triage vital signs and the nursing notes.  Pertinent labs & imaging results that were available during my care of the patient were reviewed by me and considered in my medical decision making (see chart for details).  Reviewed nursing notes and prior charts for additional history.   As multiple, multiple prior cts, will get u/s.   Pt has ride, does not have to drive.  Dilaudid 1 mg iv. toradol iv.  Recheck pain improved.   Discussed u/s w pt.  Will have f/u urology in next 1-2 days.   Return precautions provided.      Final Clinical Impressions(s) / ED Diagnoses   Final diagnoses:  None    New Prescriptions New Prescriptions   No medications on file   I  personally performed the services described in this documentation, which was scribed in my presence. The recorded information has been reviewed and considered. Cathren Laine, MD     Cathren Laine, MD 08/02/16 [phone]

## 2016-08-03 NOTE — ED Notes (Signed)
Continues to wait for ride.

## 2016-08-03 NOTE — ED Notes (Addendum)
ED MD informed of BP 165/135. Waiting for ride home

## 2016-08-04 MED FILL — TAMSULOSIN HCL 0.4 MG CAP: 0.4 | 10 days supply | Qty: 10 | Fill #0

## 2016-08-04 MED FILL — OXYCODONE/APAP 5-325: 5-325 | 2 days supply | Qty: 15 | Fill #0

## 2017-03-07 ENCOUNTER — Encounter (HOSPITAL_BASED_OUTPATIENT_CLINIC_OR_DEPARTMENT_OTHER): Payer: Self-pay | Admitting: *Deleted

## 2017-03-07 ENCOUNTER — Emergency Department (HOSPITAL_BASED_OUTPATIENT_CLINIC_OR_DEPARTMENT_OTHER): Payer: Self-pay

## 2017-03-07 ENCOUNTER — Emergency Department (HOSPITAL_BASED_OUTPATIENT_CLINIC_OR_DEPARTMENT_OTHER)
Admission: EM | Admit: 2017-03-07 | Discharge: 2017-03-07 | Disposition: A | Discharge status: Home / Self Care | Payer: Self-pay | Attending: Physician Assistant | Admitting: Physician Assistant

## 2017-03-07 ENCOUNTER — Other Ambulatory Visit: Payer: Self-pay

## 2017-03-07 DIAGNOSIS — F17210 Nicotine dependence, cigarettes, uncomplicated: Secondary | ICD-10-CM | POA: Insufficient documentation

## 2017-03-07 DIAGNOSIS — Z79899 Other long term (current) drug therapy: Secondary | ICD-10-CM | POA: Insufficient documentation

## 2017-03-07 DIAGNOSIS — N200 Calculus of kidney: Secondary | ICD-10-CM | POA: Insufficient documentation

## 2017-03-07 DIAGNOSIS — I10 Essential (primary) hypertension: Secondary | ICD-10-CM | POA: Insufficient documentation

## 2017-03-07 DIAGNOSIS — E119 Type 2 diabetes mellitus without complications: Secondary | ICD-10-CM | POA: Insufficient documentation

## 2017-03-07 LAB — COMPREHENSIVE METABOLIC PANEL
ALBUMIN: 3.9 g/dL (ref 3.5–5.0)
ALK PHOS: 103 U/L (ref 38–126)
ALT: 94 U/L — AB (ref 17–63)
ANION GAP: 9 (ref 5–15)
AST: 60 U/L — ABNORMAL HIGH (ref 15–41)
BUN: 13 mg/dL (ref 6–20)
CO2: 26 mmol/L (ref 22–32)
Calcium: 9.3 mg/dL (ref 8.9–10.3)
Chloride: 100 mmol/L — ABNORMAL LOW (ref 101–111)
Creatinine, Ser: 0.7 mg/dL (ref 0.61–1.24)
GFR calc non Af Amer: >60 mL/min (ref >60)
GLUCOSE: 435 mg/dL — AB (ref 65–99)
Potassium: 4.1 mmol/L (ref 3.5–5.1)
SODIUM: 135 mmol/L (ref 135–145)
Total Bilirubin: 0.9 mg/dL (ref 0.3–1.2)
Total Protein: 7.3 g/dL (ref 6.5–8.1)

## 2017-03-07 LAB — CBC WITH DIFFERENTIAL/PLATELET
BASOS ABS: 0.1 10*3/uL (ref 0.0–0.1)
BASOS PCT: 1 %
EOS ABS: 0.1 10*3/uL (ref 0.0–0.7)
Eosinophils Relative: 2 %
HCT: 49.9 % (ref 39.0–52.0)
HEMOGLOBIN: 18.1 g/dL — AB (ref 13.0–17.0)
LYMPHS PCT: 28 %
Lymphs Abs: 1.8 10*3/uL (ref 0.7–4.0)
MCH: 29.5 pg (ref 26.0–34.0)
MCHC: 36.3 g/dL — AB (ref 30.0–36.0)
MCV: 81.3 fL (ref 78.0–100.0)
MONO ABS: 0.7 10*3/uL (ref 0.1–1.0)
Monocytes Relative: 10 %
NEUTROS PCT: 59 %
Neutro Abs: 3.8 10*3/uL (ref 1.7–7.7)
PLATELETS: 98 10*3/uL — AB (ref 150–400)
RBC: 6.14 MIL/uL — ABNORMAL HIGH (ref 4.22–5.81)
RDW: 12.7 % (ref 11.5–15.5)
SMEAR REVIEW: DECREASED
WBC: 6.5 10*3/uL (ref 4.0–10.5)

## 2017-03-07 LAB — URINALYSIS, ROUTINE W REFLEX MICROSCOPIC
Bilirubin Urine: NEGATIVE
HGB URINE DIPSTICK: NEGATIVE
KETONES UR: NEGATIVE mg/dL
Leukocytes, UA: NEGATIVE
Nitrite: NEGATIVE
PROTEIN: NEGATIVE mg/dL
Specific Gravity, Urine: <1.005 — ABNORMAL LOW (ref 1.005–1.030)
pH: 6 (ref 5.0–8.0)

## 2017-03-07 LAB — URINALYSIS, MICROSCOPIC (REFLEX): WBC UA: NONE SEEN WBC/hpf (ref 0–5)

## 2017-03-07 MED ORDER — TAMSULOSIN HCL 0.4 MG PO CAPS: 0.4000 mg | ORAL_CAPSULE | Freq: Every day | ORAL | 0 refills | Status: DC

## 2017-03-07 MED ORDER — MORPHINE SULFATE (PF) 4 MG/ML IV SOLN
4.0000 mg | Freq: Once | INTRAVENOUS | Status: AC
  Administered 2017-03-07: 4 mg via INTRAMUSCULAR
  Filled 2017-03-07: qty 1

## 2017-03-07 MED ORDER — OXYCODONE-ACETAMINOPHEN 5-325 MG PO TABS: 1.0000 | ORAL_TABLET | Freq: Four times a day (QID) | ORAL | 0 refills | Status: DC | PRN

## 2017-03-07 MED FILL — OXYCODONE-ACETAMINOPHEN 5-3: 5-325 | 1 days supply | Qty: 5 | Fill #0

## 2017-03-07 MED FILL — TAMSULOSIN HCL 0.4 MG CAP: 0.4 | 30 days supply | Qty: 30 | Fill #0

## 2017-03-07 NOTE — ED Notes (Signed)
NAD at this time. Pt is stable and going home.  

## 2017-03-07 NOTE — Discharge Instructions (Signed)
We recommend that you follow-up with urology.  You do not have any signs of infection, however you do have an obstructing stone that has been seen prior.

## 2017-03-07 NOTE — ED Triage Notes (Signed)
Pt stated that he has been experiencing kidney stone pain since Wednesday.

## 2017-03-07 NOTE — ED Provider Notes (Signed)
[facility] EMERGENCY DEPARTMENT Provider Note   CSN: [id number] Arrival date & time: 03/07/17  0849     History   Chief Complaint Chief Complaint  Patient presents with  . Nephrolithiasis    HPI Michael Carroll is a 57 y.o. male.  HPI  57 year old male with past medical history significant for nephrolithiasis.  Patient's been seen multiple times for this.  Has had urologist in the past but is lost insurance recently.  Patient has right flank pain.  He reports drinking lots of water but still having the right flank pain.  Has no medications at home  Past Medical History:  Diagnosis Date  . At risk for sleep apnea    STOP-BANG=  4     . Cirrhosis of liver without mention of alcohol    PER LIVER BX 03/2012  . Headache(784.0)    migraines  . History of kidney stones   . History of migraine   . Hypertension    NO MEDS  . Productive cough   . Right ureteral stone   . Smokers' cough (HCC)   . Type 2 diabetes, diet controlled [facility](HCC)     Patient Active Problem List   Diagnosis Date Noted  . Chronic cholecystitis with calculus 03/26/2012    Past Surgical History:  Procedure Laterality Date  . CARDIOVASCULAR STRESS TEST  2004   CARDIOLITE STUDY NORMAL/  EF 60%  . CHOLECYSTECTOMY N/A 03/26/2012   Procedure: LAPAROSCOPIC CHOLECYSTECTOMY;  Surgeon: Romie LeveeAlicia Thomas, MD;  Location: [facility];  Service: General;  Laterality: N/A;  . CYSTO/   RIGHT URETERAL STENT EXCHANGE  12/2007  . CYSTO/  RIGHT URETERAL STENT PLACEMENT  06/2006  . CYSTOSCOPY  07/11/2013   Procedure: CYSTOSCOPY RIGHT STENT REMOVAL;  Surgeon: Crist FatBenjamin W Herrick, MD;  Location: [facility];  Service: Urology;;  . Bluford KaufmannYSTOSCOPY WITH RETROGRADE PYELOGRAM, URETEROSCOPY AND STENT PLACEMENT Right 05/15/2013   Procedure: CYSTOSCOPY WITH RETROGRADE PYELOGRAM, URETEROSCOPY AND STENT PLACEMENT;  Surgeon: Crist FatBenjamin W Herrick, MD;  Location: [facility];  Service: Urology;  Laterality: Right;  . CYSTOSCOPY WITH RETROGRADE  PYELOGRAM, URETEROSCOPY AND STENT PLACEMENT Right 05/30/2013   Procedure: CYSTOSCOPY WITH RIGHT RETROGRADE PYELOGRAM, URETEROSCOPY LASER LITHOTRIPSY AND STENT EXCHANGE ;  Surgeon: Crist FatBenjamin W Herrick, MD;  [facility] [place];  Service: Urology;  Laterality: Right;  . EXTRACORPOREAL SHOCK WAVE LITHOTRIPSY    . HOLMIUM LASER APPLICATION Right 05/30/2013   Procedure: HOLMIUM LASER APPLICATION;  Surgeon: Crist FatBenjamin W Herrick, MD;  [facility] [place];  Service: Urology;  Laterality: Right;  . LIVER BIOPSY N/A 03/26/2012   Procedure: LIVER BIOPSY;  Surgeon: Romie LeveeAlicia Thomas, MD;  Location: [facility];  Service: General;  Laterality: N/A;       Home Medications    Prior to Admission medications   Medication Sig Start Date End Date Taking? Authorizing Provider  metoCLOPramide (REGLAN) 10 MG tablet Take 1 tablet (10 mg total) by mouth every 8 (eight) hours as needed (headache). 10/15/15   Tomasita Crumbleni, Adeleke, MD  oxyCODONE-acetaminophen (PERCOCET/ROXICET) 5-325 MG tablet Take 1-2 tablets by mouth every 6 (six) hours as needed for severe pain. 08/02/16   Cathren LaineSteinl, Kevin, MD  oxyCODONE-acetaminophen (PERCOCET/ROXICET) 5-325 MG tablet Take 1 tablet by mouth every 6 (six) hours as needed for severe pain. 03/07/17   Boruch Manuele Lyn, MD  tamsulosin (FLOMAX) 0.4 MG CAPS capsule Take 1 capsule (0.4 mg total) by mouth daily. 08/02/16   Cathren LaineSteinl, Kevin, MD  tamsulosin (FLOMAX) 0.4 MG CAPS capsule Take  1 capsule (0.4 mg total) by mouth daily. 03/07/17   Malanie Koloski, Cindee Salt, MD    Family History Family History  Problem Relation Age of Onset  . Cancer - Lung Mother   . Cancer Mother   . Seizures Sister   . Heart disease Father     Social History Social History   Tobacco Use  . Smoking status: Current Every Day Smoker    Packs/day: 1.50    Years: 25.00    Pack years: 37.50    Types: Cigarettes  . Smokeless tobacco: Never Used  Substance Use Topics  . Alcohol use: No  . Drug use:  No     Allergies   Zofran [ondansetron hcl] and Compazine   Review of Systems Review of Systems  Constitutional: Negative for activity change.  Respiratory: Negative for shortness of breath.   Cardiovascular: Negative for chest pain.  Gastrointestinal: Negative for abdominal pain.  Genitourinary: Negative for difficulty urinating, dysuria and frequency.     Physical Exam Updated Vital Signs BP (!) 170/105 (BP Location: Right Arm)   Pulse 68   Temp 98 F (36.7 C) (Oral)   Resp 18   Ht 6' (1.829 m)   Wt 120.2 kg (265 lb)   SpO2 100%   BMI 35.94 kg/m   Physical Exam  Constitutional: He is oriented to person, place, and time. He appears well-nourished.  HENT:  Head: Normocephalic.  Eyes: Conjunctivae are normal. Right eye exhibits no discharge. Left eye exhibits no discharge.  Cardiovascular: Normal rate and regular rhythm.  Pulmonary/Chest: Effort normal and breath sounds normal. No respiratory distress.  Abdominal: Soft. Bowel sounds are normal. He exhibits no distension. There is no tenderness.  Flank pain   Neurological: He is oriented to person, place, and time.  Skin: Skin is warm and dry. He is not diaphoretic.  Psychiatric: He has a normal mood and affect. His behavior is normal.     ED Treatments / Results  Labs (all labs ordered are listed, but only abnormal results are displayed) Labs Reviewed  URINALYSIS, ROUTINE W REFLEX MICROSCOPIC - Abnormal; Notable for the following components:      Result Value   Specific Gravity, Urine <1.005 (*)    Glucose, UA >=500 (*)    All other components within normal limits  CBC WITH DIFFERENTIAL/PLATELET - Abnormal; Notable for the following components:   RBC 6.14 (*)    Hemoglobin 18.1 (*)    MCHC 36.3 (*)    Platelets 98 (*)    All other components within normal limits  COMPREHENSIVE METABOLIC PANEL - Abnormal; Notable for the following components:   Chloride 100 (*)    Glucose, Bld 435 (*)    AST 60 (*)     ALT 94 (*)    All other components within normal limits  URINALYSIS, MICROSCOPIC (REFLEX) - Abnormal; Notable for the following components:   Bacteria, UA RARE (*)    Squamous Epithelial / LPF 0-5 (*)    All other components within normal limits    EKG  EKG Interpretation None       Radiology US Renal  Result Date: 03/07/2017 CLINICAL DATA:  Right flank pain EXAM: RENAL / URINARY TRACT ULTRASOUND COMPLETE COMPARISON:  Ultrasound 08/02/2016.  CT 02/10/2015 FINDINGS: Right Kidney: Length: 12.9 cm. Mild to moderate right hydronephrosis. Right ureter is dilated. Echogenic focus right upper pole could represent a stone however no calculus was identified in this area on the prior CT. Normal renal cortex. Left Kidney:  Length: 12.7 cm. Echogenicity within normal limits. No mass or hydronephrosis visualized. Bladder: Right ureteral jet not visualized due to obstruction of the right ureter. Left ureteral jet normal. Bladder normal. IMPRESSION: Mild to moderate right hydronephrosis possibly due to distal ureteral stricture or stone, similar to prior studies. Normal left kidney. Electronically Signed   By: Marlan Palau M.D.   On: 03/07/2017 11:48    Procedures Procedures (including critical care time)  Medications Ordered in ED Medications  morphine 4 MG/ML injection 4 mg (4 mg Intramuscular Given 03/07/17 1032)     Initial Impression / Assessment and Plan / ED Course  I have reviewed the triage vital signs and the nursing notes.  Pertinent labs & imaging results that were available during my care of the patient were reviewed by me and considered in my medical decision making (see chart for details).     57 year old male with past medical history significant for nephrolithiasis.  Patient's been seen multiple times for this.  Has had urologist in the past but is lost insurance recently.  Patient has right flank pain.  He reports drinking lots of water but still having the right flank pain.   Has no medications at home  Will get ultrasound given that he had so many CTs in the past.  Will give pain control.  Check kidney function.   12:01 PM Ultrasound looks same as prior.  No evidence of infection.  Patient looks very comfortable.  We will have [facility] or primary care.  Final Clinical Impressions(s) / ED Diagnoses   Final diagnoses:  Kidney stone    ED Discharge Orders        Ordered    oxyCODONE-acetaminophen (PERCOCET/ROXICET) 5-325 MG tablet  Every 6 hours PRN     03/07/17 1159    tamsulosin (FLOMAX) 0.4 MG CAPS capsule  Daily     03/07/17 1159       Yvonne Petite Lyn, MD 03/07/17 1201

## 2017-03-30 ENCOUNTER — Emergency Department (HOSPITAL_BASED_OUTPATIENT_CLINIC_OR_DEPARTMENT_OTHER): Payer: Self-pay

## 2017-03-30 ENCOUNTER — Encounter (HOSPITAL_BASED_OUTPATIENT_CLINIC_OR_DEPARTMENT_OTHER): Payer: Self-pay | Admitting: Emergency Medicine

## 2017-03-30 ENCOUNTER — Other Ambulatory Visit: Payer: Self-pay

## 2017-03-30 ENCOUNTER — Emergency Department (HOSPITAL_BASED_OUTPATIENT_CLINIC_OR_DEPARTMENT_OTHER)
Admission: EM | Admit: 2017-03-30 | Discharge: 2017-03-30 | Disposition: A | Discharge status: Home / Self Care | Payer: Self-pay | Attending: Emergency Medicine | Admitting: Emergency Medicine

## 2017-03-30 DIAGNOSIS — Z7982 Long term (current) use of aspirin: Secondary | ICD-10-CM | POA: Insufficient documentation

## 2017-03-30 DIAGNOSIS — R739 Hyperglycemia, unspecified: Secondary | ICD-10-CM

## 2017-03-30 DIAGNOSIS — K7460 Unspecified cirrhosis of liver: Secondary | ICD-10-CM | POA: Insufficient documentation

## 2017-03-30 DIAGNOSIS — F17210 Nicotine dependence, cigarettes, uncomplicated: Secondary | ICD-10-CM | POA: Insufficient documentation

## 2017-03-30 DIAGNOSIS — Z9114 Patient's other noncompliance with medication regimen: Secondary | ICD-10-CM | POA: Insufficient documentation

## 2017-03-30 DIAGNOSIS — I10 Essential (primary) hypertension: Secondary | ICD-10-CM | POA: Insufficient documentation

## 2017-03-30 DIAGNOSIS — E1165 Type 2 diabetes mellitus with hyperglycemia: Secondary | ICD-10-CM | POA: Insufficient documentation

## 2017-03-30 DIAGNOSIS — R42 Dizziness and giddiness: Secondary | ICD-10-CM | POA: Insufficient documentation

## 2017-03-30 DIAGNOSIS — Z7984 Long term (current) use of oral hypoglycemic drugs: Secondary | ICD-10-CM | POA: Insufficient documentation

## 2017-03-30 DIAGNOSIS — Z79899 Other long term (current) drug therapy: Secondary | ICD-10-CM | POA: Insufficient documentation

## 2017-03-30 LAB — URINALYSIS, ROUTINE W REFLEX MICROSCOPIC
Bilirubin Urine: NEGATIVE
Glucose, UA: >=500 mg/dL — AB
Hgb urine dipstick: NEGATIVE
Ketones, ur: NEGATIVE mg/dL
LEUKOCYTES UA: NEGATIVE
Nitrite: NEGATIVE
PROTEIN: NEGATIVE mg/dL
Specific Gravity, Urine: <1.005 — ABNORMAL LOW (ref 1.005–1.030)
pH: 5.5 (ref 5.0–8.0)

## 2017-03-30 LAB — CBC WITH DIFFERENTIAL/PLATELET
Basophils Absolute: 0.1 10*3/uL (ref 0.0–0.1)
Basophils Relative: 1 %
EOS ABS: 0.1 10*3/uL (ref 0.0–0.7)
Eosinophils Relative: 2 %
HCT: 48.7 % (ref 39.0–52.0)
Hemoglobin: 17.7 g/dL — ABNORMAL HIGH (ref 13.0–17.0)
Lymphocytes Relative: 33 %
Lymphs Abs: 1.8 10*3/uL (ref 0.7–4.0)
MCH: 29.6 pg (ref 26.0–34.0)
MCHC: 36.3 g/dL — ABNORMAL HIGH (ref 30.0–36.0)
MCV: 81.4 fL (ref 78.0–100.0)
MONO ABS: 0.5 10*3/uL (ref 0.1–1.0)
Monocytes Relative: 9 %
NEUTROS PCT: 55 %
Neutro Abs: 3.1 10*3/uL (ref 1.7–7.7)
PLATELETS: 95 10*3/uL — AB (ref 150–400)
RBC: 5.98 MIL/uL — AB (ref 4.22–5.81)
RDW: 12.8 % (ref 11.5–15.5)
SMEAR REVIEW: DECREASED
WBC: 5.6 10*3/uL (ref 4.0–10.5)

## 2017-03-30 LAB — COMPREHENSIVE METABOLIC PANEL
ALK PHOS: 111 U/L (ref 38–126)
ALT: 107 U/L — AB (ref 17–63)
AST: 80 U/L — ABNORMAL HIGH (ref 15–41)
Albumin: 3.9 g/dL (ref 3.5–5.0)
Anion gap: 11 (ref 5–15)
BUN: 17 mg/dL (ref 6–20)
CALCIUM: 9.3 mg/dL (ref 8.9–10.3)
CHLORIDE: 96 mmol/L — AB (ref 101–111)
CO2: 22 mmol/L (ref 22–32)
CREATININE: 0.95 mg/dL (ref 0.61–1.24)
GFR calc non Af Amer: >60 mL/min (ref >60)
Glucose, Bld: 681 mg/dL (ref 65–99)
Potassium: 4.2 mmol/L (ref 3.5–5.1)
Sodium: 129 mmol/L — ABNORMAL LOW (ref 135–145)
Total Bilirubin: 1.1 mg/dL (ref 0.3–1.2)
Total Protein: 7.1 g/dL (ref 6.5–8.1)

## 2017-03-30 LAB — URINALYSIS, MICROSCOPIC (REFLEX)
Bacteria, UA: NONE SEEN
RBC / HPF: NONE SEEN RBC/hpf (ref 0–5)

## 2017-03-30 LAB — I-STAT VENOUS BLOOD GAS, ED
ACID-BASE DEFICIT: 4 mmol/L — AB (ref 0.0–2.0)
Bicarbonate: 21.7 mmol/L (ref 20.0–28.0)
O2 SAT: 92 %
PCO2 VEN: 40.2 mmHg — AB (ref 44.0–60.0)
TCO2: 23 mmol/L (ref 22–32)
pH, Ven: 7.339 (ref 7.250–7.430)
pO2, Ven: 66 mmHg — ABNORMAL HIGH (ref 32.0–45.0)

## 2017-03-30 LAB — CBG MONITORING, ED
GLUCOSE-CAPILLARY: 406 mg/dL — AB (ref 65–99)
Glucose-Capillary: >600 mg/dL (ref 65–99)
Glucose-Capillary: 486 mg/dL — ABNORMAL HIGH (ref 65–99)
Glucose-Capillary: 265 mg/dL — ABNORMAL HIGH (ref 65–99)

## 2017-03-30 LAB — TSH: TSH: 1.491 u[IU]/mL (ref 0.350–4.500)

## 2017-03-30 LAB — TROPONIN I: Troponin I: <0.03 ng/mL (ref <0.03)

## 2017-03-30 MED ORDER — SODIUM CHLORIDE 0.9 % IV BOLUS (SEPSIS)
1000.0000 mL | Freq: Once | INTRAVENOUS | Status: AC
  Administered 2017-03-30: 1000 mL via INTRAVENOUS

## 2017-03-30 MED ORDER — SODIUM CHLORIDE 0.9 % IV BOLUS (SEPSIS)
1000.0000 mL | Freq: Once | INTRAVENOUS | Status: AC
Start: 2017-03-30 — End: 2017-03-30
  Administered 2017-03-30: 1000 mL via INTRAVENOUS

## 2017-03-30 MED ORDER — AMLODIPINE BESYLATE 5 MG PO TABS: 5.0000 mg | ORAL_TABLET | Freq: Every day | ORAL | 0 refills | Status: DC

## 2017-03-30 MED ORDER — INSULIN REGULAR HUMAN 100 UNIT/ML IJ SOLN
6.0000 [IU] | Freq: Once | INTRAMUSCULAR | Status: AC
  Administered 2017-03-30: 6 [IU] via SUBCUTANEOUS
  Filled 2017-03-30: qty 1

## 2017-03-30 MED ORDER — METFORMIN HCL 500 MG PO TABS: 500.0000 mg | ORAL_TABLET | Freq: Two times a day (BID) | ORAL | 0 refills | Status: DC

## 2017-03-30 MED FILL — metFORMIN HCL 500 MG TABS: 500 | 30 days supply | Qty: 60 | Fill #0

## 2017-03-30 MED FILL — AMLODIPINE BESYLATE 5 MG TA: 5 | 30 days supply | Qty: 30 | Fill #0

## 2017-03-30 NOTE — ED Triage Notes (Signed)
Over past month pt has lost over 30 pounds.  Sts his blood sugar was over 500 last night and his bp has been elevated.  He gets confused sometimes.   Sts he used to be on DM and BP meds but has not been on them for 5-6 years. Has no pmd.

## 2017-03-30 NOTE — ED Notes (Signed)
EDP notified of pt's CBG. 

## 2017-03-30 NOTE — ED Provider Notes (Signed)
[facility] EMERGENCY DEPARTMENT Provider Note   CSN: [id number] Arrival date & time: 03/30/17  0758     History   Chief Complaint Chief Complaint  Patient presents with  . Weight Loss    HPI Michael Carroll is a 57 y.o. male.  HPI Patient presents with hyperglycemia and weight loss.  States that he has a history of diabetes but has been off his metformin for years.  States years ago did not need it.  States over the last 6 weeks he has had around 30 pounds of weight loss.  States he is urinating all the time.  Drinking around 2-1/2 L of water a day.  Has had headaches recently 2.  States he has been unsteady and has not been driving because of it.  He is a smoker.  Has a lesion on his left forearm to that he is worried about skin cancer with.  Also history of hypertension but has been off the medicines.  History of kidney stones and has been seen a month ago for that in the ER and found to have a sugar 400 at that time.  States he left during that visit.  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  Also states he has cirrhosis and hepatitis C.  Thinks he got up from a tattoo he got in [place].  He had checked his sugar at home today and found to be above the limits of the machine. Past Medical History:  Diagnosis Date  . At risk for sleep apnea    STOP-BANG=  4     . Cirrhosis of liver without mention of alcohol    PER LIVER BX 03/2012  . Headache(784.0)    migraines  . History of kidney stones   . History of migraine   . Hypertension    NO MEDS  . Productive cough   . Right ureteral stone   . Smokers' cough (HCC)   . Type 2 diabetes, diet controlled [facility])     Patient Active Problem List   Diagnosis Date Noted  . Chronic cholecystitis with calculus 03/26/2012    Past Surgical History:  Procedure Laterality Date  . CARDIOVASCULAR STRESS TEST  2004   CARDIOLITE STUDY NORMAL/  EF 60%  . CHOLECYSTECTOMY N/A 03/26/2012   Procedure: LAPAROSCOPIC  CHOLECYSTECTOMY;  Surgeon: Romie Levee, MD;  Location: [facility];  Service: General;  Laterality: N/A;  . CYSTO/   RIGHT URETERAL STENT EXCHANGE  12/2007  . CYSTO/  RIGHT URETERAL STENT PLACEMENT  06/2006  . CYSTOSCOPY  07/11/2013   Procedure: CYSTOSCOPY RIGHT STENT REMOVAL;  Surgeon: Crist Fat, MD;  Location: [facility];  Service: Urology;;  . Bluford Kaufmann WITH RETROGRADE PYELOGRAM, URETEROSCOPY AND STENT PLACEMENT Right 05/15/2013   Procedure: CYSTOSCOPY WITH RETROGRADE PYELOGRAM, URETEROSCOPY AND STENT PLACEMENT;  Surgeon: Crist Fat, MD;  Location: [facility];  Service: Urology;  Laterality: Right;  . CYSTOSCOPY WITH RETROGRADE PYELOGRAM, URETEROSCOPY AND STENT PLACEMENT Right 05/30/2013   Procedure: CYSTOSCOPY WITH RIGHT RETROGRADE PYELOGRAM, URETEROSCOPY LASER LITHOTRIPSY AND STENT EXCHANGE ;  Surgeon: Crist Fat, MD;  Location: [facility];  Service: Urology;  Laterality: Right;  . EXTRACORPOREAL SHOCK WAVE LITHOTRIPSY    . HOLMIUM LASER APPLICATION Right 05/30/2013   Procedure: HOLMIUM LASER APPLICATION;  Surgeon: Crist Fat, MD;  Location: [facility];  Service: Urology;  Laterality: Right;  . LIVER BIOPSY N/A 03/26/2012   Procedure: LIVER BIOPSY;  Surgeon: Helmut Muster  Maisie Fushomas, MD;  Location: [facility];  Service: General;  Laterality: N/A;       Home Medications    Prior to Admission medications   Medication Sig Start Date End Date Taking? Authorizing Provider  aspirin EC 81 MG tablet Take 81 mg by mouth daily.   Yes [provider]  amLODipine (NORVASC) 5 MG tablet Take 1 tablet (5 mg total) by mouth daily. 03/30/17   Benjiman CorePickering, Ona Rathert, MD  metFORMIN (GLUCOPHAGE) 500 MG tablet Take 1 tablet (500 mg total) by mouth 2 (two) times daily with a meal. 03/30/17   Benjiman CorePickering, Corrinne Benegas, MD  metoCLOPramide (REGLAN) 10 MG tablet Take 1 tablet (10 mg total) by mouth every 8 (eight) hours as needed (headache). 10/15/15   Tomasita Crumbleni, Adeleke, MD    oxyCODONE-acetaminophen (PERCOCET/ROXICET) 5-325 MG tablet Take 1-2 tablets by mouth every 6 (six) hours as needed for severe pain. 08/02/16   Cathren LaineSteinl, Kevin, MD  oxyCODONE-acetaminophen (PERCOCET/ROXICET) 5-325 MG tablet Take 1 tablet by mouth every 6 (six) hours as needed for severe pain. 03/07/17   Mackuen, Courteney Lyn, MD  tamsulosin (FLOMAX) 0.4 MG CAPS capsule Take 1 capsule (0.4 mg total) by mouth daily. 08/02/16   Cathren LaineSteinl, Kevin, MD  tamsulosin (FLOMAX) 0.4 MG CAPS capsule Take 1 capsule (0.4 mg total) by mouth daily. 03/07/17   Mackuen, Cindee Saltourteney Lyn, MD    Family History Family History  Problem Relation Age of Onset  . Cancer - Lung Mother   . Cancer Mother   . Seizures Sister   . Heart disease Father     Social History Social History   Tobacco Use  . Smoking status: Current Every Day Smoker    Packs/day: 1.50    Years: 25.00    Pack years: 37.50    Types: Cigarettes  . Smokeless tobacco: Never Used  Substance Use Topics  . Alcohol use: No  . Drug use: No     Allergies   Zofran [ondansetron hcl] and Compazine   Review of Systems Review of Systems  Constitutional: Positive for appetite change and unexpected weight change.  HENT: Negative for congestion.   Respiratory: Negative for shortness of breath.   Cardiovascular: Negative for chest pain.  Gastrointestinal: Negative for abdominal pain.  Endocrine: Positive for polydipsia, polyphagia and polyuria.  Musculoskeletal: Negative for back pain.  Skin: Positive for wound.  Neurological: Positive for headaches. Negative for speech difficulty.     Physical Exam Updated Vital Signs BP (!) 152/94 (BP Location: Left Arm)   Pulse 62   Temp 98.3 F (36.8 C) (Oral)   Resp 16   Ht 6' (1.829 m)   Wt 116.1 kg (256 lb)   SpO2 97%   BMI 34.72 kg/m   Physical Exam  Constitutional: He appears well-developed.  HENT:  Head: Normocephalic.  Eyes: EOM are normal.  Neck: Neck supple.  Cardiovascular: Normal rate.   Pulmonary/Chest: Effort normal.  Abdominal: There is tenderness.  Musculoskeletal:  Left forearm has an approximately 1 cm round raised lesion with slight area of dried blood on the top of it.  Neurological: He is alert.  Skin: Skin is warm. Capillary refill takes less than 2 seconds.  Psychiatric: He has a normal mood and affect.     ED Treatments / Results  Labs (all labs ordered are listed, but only abnormal results are displayed) Labs Reviewed  COMPREHENSIVE METABOLIC PANEL - Abnormal; Notable for the following components:      Result Value   Sodium 129 (*)  Chloride 96 (*)    Glucose, Bld 681 (*)    AST 80 (*)    ALT 107 (*)    All other components within normal limits  CBC WITH DIFFERENTIAL/PLATELET - Abnormal; Notable for the following components:   RBC 5.98 (*)    Hemoglobin 17.7 (*)    MCHC 36.3 (*)    Platelets 95 (*)    All other components within normal limits  URINALYSIS, ROUTINE W REFLEX MICROSCOPIC - Abnormal; Notable for the following components:   Specific Gravity, Urine <1.005 (*)    Glucose, UA >=500 (*)    All other components within normal limits  URINALYSIS, MICROSCOPIC (REFLEX) - Abnormal; Notable for the following components:   Squamous Epithelial / LPF 0-5 (*)    All other components within normal limits  CBG MONITORING, ED - Abnormal; Notable for the following components:   Glucose-Capillary >600 (*)    All other components within normal limits  I-STAT VENOUS BLOOD GAS, ED - Abnormal; Notable for the following components:   pCO2, Ven 40.2 (*)    pO2, Ven 66.0 (*)    Acid-base deficit 4.0 (*)    All other components within normal limits  CBG MONITORING, ED - Abnormal; Notable for the following components:   Glucose-Capillary 486 (*)    All other components within normal limits  CBG MONITORING, ED - Abnormal; Notable for the following components:   Glucose-Capillary 406 (*)    All other components within normal limits  CBG MONITORING, ED -  Abnormal; Notable for the following components:   Glucose-Capillary 265 (*)    All other components within normal limits  TSH  TROPONIN I    EKG  EKG Interpretation  Date/Time:  Wednesday March 30 2017 08:49:31 EDT Ventricular Rate:  77 PR Interval:    QRS Duration: 95 QT Interval:  371 QTC Calculation: 420 R Axis:   -70 Text Interpretation:  Sinus rhythm Abnormal R-wave progression, late transition Inferior infarct, old Lateral leads are also involved inferior and lateral ST changes more pronounced than prior Confirmed by Benjiman Core [phone]) on 03/30/2017 8:55:50 AM       Radiology Dg Chest 2 View  Result Date: 03/30/2017 CLINICAL DATA:  Cough, weight loss. EXAM: CHEST - 2 VIEW COMPARISON:  Radiographs of December 12, 2006. FINDINGS: The heart size and mediastinal contours are within normal limits. Both lungs are clear. No pneumothorax or pleural effusion is noted. The visualized skeletal structures are unremarkable. IMPRESSION: No active cardiopulmonary disease. Electronically Signed   By: Lupita Raider, M.D.   On: 03/30/2017 09:22   Ct Head Wo Contrast  Result Date: 03/30/2017 CLINICAL DATA:  57 year old male with unintentional weight loss over the past month, hyperglycemia, confusion, dizziness, altered mental status. EXAM: CT HEAD WITHOUT CONTRAST TECHNIQUE: Contiguous axial images were obtained from the base of the skull through the vertex without intravenous contrast. COMPARISON:  CTA head and neck 10/15/2015 FINDINGS: Brain: Cerebral volume remains normal. No midline shift, ventriculomegaly, mass effect, evidence of mass lesion, intracranial hemorrhage or evidence of cortically based acute infarction. Gray-white matter differentiation is within normal limits throughout the brain. Vascular: Calcified atherosclerosis at the skull base. No suspicious intracranial vascular hyperdensity. Skull: Negative. Sinuses/Orbits: Remain clear. Bilateral tympanic cavities and mastoids  remain clear. Other: Visualized orbits and scalp soft tissues are within normal limits. IMPRESSION: Stable and normal noncontrast Head CT. Electronically Signed   By: Odessa Fleming M.D.   On: 03/30/2017 09:27    Procedures Procedures (including  critical care time)  Medications Ordered in ED Medications  sodium chloride 0.9 % bolus 1,000 mL (0 mLs Intravenous Stopped 03/30/17 0959)  sodium chloride 0.9 % bolus 1,000 mL (0 mLs Intravenous Stopped 03/30/17 1340)  insulin regular (NOVOLIN R,HUMULIN R) 100 units/mL injection 6 Units (6 Units Subcutaneous Given 03/30/17 1316)     Initial Impression / Assessment and Plan / ED Course  I have reviewed the triage vital signs and the nursing notes.  Pertinent labs & imaging results that were available during my care of the patient were reviewed by me and considered in my medical decision making (see chart for details).     Patient presents after years of noncompliance with medication.  Found to be hyperglycemic.  Not in DKA.  Also hypertensive.  Blood pressure improved and CBG improved after treatment.  Feels better.  Discussed need for follow-up.  Also has had some dizziness and head CT done and reassuring.  Chest x-ray reassuring.  Will have follow-up with internal medicine.  Discharge home.  Final Clinical Impressions(s) / ED Diagnoses   Final diagnoses:  Hyperglycemia  Hypertension, unspecified type  Noncompliance with medication regimen    ED Discharge Orders        Ordered    metFORMIN (GLUCOPHAGE) 500 MG tablet  2 times daily with meals     03/30/17 1426    amLODipine (NORVASC) 5 MG tablet  Daily     03/30/17 1426       Benjiman Core, MD 03/30/17 1429

## 2017-03-30 NOTE — ED Notes (Signed)
NAD at this time. Pt is stable and going home.  

## 2017-03-30 NOTE — Discharge Instructions (Signed)
Follow-up with your primary care provider.  You have been given the information for 2 different providers in this building.  Take your medicines.

## 2017-03-30 NOTE — ED Notes (Signed)
Patient transported to CT 

## 2017-03-30 NOTE — ED Notes (Signed)
ED Provider at bedside. 

## 2017-05-26 ENCOUNTER — Emergency Department (HOSPITAL_BASED_OUTPATIENT_CLINIC_OR_DEPARTMENT_OTHER)
Admission: EM | Admit: 2017-05-26 | Discharge: 2017-05-26 | Disposition: A | Discharge status: Home / Self Care | Payer: Self-pay | Attending: Emergency Medicine | Admitting: Emergency Medicine

## 2017-05-26 ENCOUNTER — Other Ambulatory Visit: Payer: Self-pay

## 2017-05-26 ENCOUNTER — Encounter (HOSPITAL_BASED_OUTPATIENT_CLINIC_OR_DEPARTMENT_OTHER): Payer: Self-pay | Admitting: Adult Health

## 2017-05-26 DIAGNOSIS — I10 Essential (primary) hypertension: Secondary | ICD-10-CM | POA: Insufficient documentation

## 2017-05-26 DIAGNOSIS — Z7984 Long term (current) use of oral hypoglycemic drugs: Secondary | ICD-10-CM | POA: Insufficient documentation

## 2017-05-26 DIAGNOSIS — E119 Type 2 diabetes mellitus without complications: Secondary | ICD-10-CM | POA: Insufficient documentation

## 2017-05-26 DIAGNOSIS — Z79899 Other long term (current) drug therapy: Secondary | ICD-10-CM | POA: Insufficient documentation

## 2017-05-26 DIAGNOSIS — Z9049 Acquired absence of other specified parts of digestive tract: Secondary | ICD-10-CM | POA: Insufficient documentation

## 2017-05-26 DIAGNOSIS — Z7982 Long term (current) use of aspirin: Secondary | ICD-10-CM | POA: Insufficient documentation

## 2017-05-26 DIAGNOSIS — N23 Unspecified renal colic: Secondary | ICD-10-CM | POA: Insufficient documentation

## 2017-05-26 DIAGNOSIS — F17210 Nicotine dependence, cigarettes, uncomplicated: Secondary | ICD-10-CM | POA: Insufficient documentation

## 2017-05-26 LAB — BASIC METABOLIC PANEL
ANION GAP: 9 (ref 5–15)
BUN: 18 mg/dL (ref 6–20)
CALCIUM: 9.4 mg/dL (ref 8.9–10.3)
CO2: 28 mmol/L (ref 22–32)
CREATININE: 0.81 mg/dL (ref 0.61–1.24)
Chloride: 99 mmol/L — ABNORMAL LOW (ref 101–111)
GFR calc Af Amer: >60 mL/min (ref >60)
Glucose, Bld: 355 mg/dL — ABNORMAL HIGH (ref 65–99)
Potassium: 4.3 mmol/L (ref 3.5–5.1)
Sodium: 136 mmol/L (ref 135–145)

## 2017-05-26 LAB — CBC WITH DIFFERENTIAL/PLATELET
BASOS ABS: 0 10*3/uL (ref 0.0–0.1)
Basophils Relative: 0 %
EOS PCT: 1 %
Eosinophils Absolute: 0.1 10*3/uL (ref 0.0–0.7)
HCT: 50.8 % (ref 39.0–52.0)
Hemoglobin: 18.4 g/dL — ABNORMAL HIGH (ref 13.0–17.0)
LYMPHS ABS: 2.1 10*3/uL (ref 0.7–4.0)
LYMPHS PCT: 30 %
MCH: 29.9 pg (ref 26.0–34.0)
MCHC: 36.2 g/dL — ABNORMAL HIGH (ref 30.0–36.0)
MCV: 82.5 fL (ref 78.0–100.0)
Monocytes Absolute: 0.6 10*3/uL (ref 0.1–1.0)
Monocytes Relative: 9 %
NEUTROS ABS: 4.1 10*3/uL (ref 1.7–7.7)
Neutrophils Relative %: 60 %
PLATELETS: 109 10*3/uL — AB (ref 150–400)
RBC: 6.16 MIL/uL — AB (ref 4.22–5.81)
RDW: 13.1 % (ref 11.5–15.5)
WBC: 7 10*3/uL (ref 4.0–10.5)

## 2017-05-26 LAB — URINALYSIS, MICROSCOPIC (REFLEX)

## 2017-05-26 LAB — URINALYSIS, ROUTINE W REFLEX MICROSCOPIC
Bilirubin Urine: NEGATIVE
Glucose, UA: >=500 mg/dL — AB
Ketones, ur: NEGATIVE mg/dL
LEUKOCYTES UA: NEGATIVE
Nitrite: NEGATIVE
PROTEIN: NEGATIVE mg/dL
SPECIFIC GRAVITY, URINE: 1.015 (ref 1.005–1.030)
pH: 5.5 (ref 5.0–8.0)

## 2017-05-26 MED ORDER — IBUPROFEN 600 MG PO TABS: 600.0000 mg | ORAL_TABLET | Freq: Four times a day (QID) | ORAL | 0 refills | Status: DC | PRN

## 2017-05-26 MED ORDER — TAMSULOSIN HCL 0.4 MG PO CAPS: 0.4000 mg | ORAL_CAPSULE | Freq: Every day | ORAL | 0 refills | Status: AC

## 2017-05-26 MED ORDER — KETOROLAC TROMETHAMINE 15 MG/ML IJ SOLN
10.0000 mg | Freq: Once | INTRAMUSCULAR | Status: AC
  Administered 2017-05-26: 10 mg via INTRAVENOUS

## 2017-05-26 MED ORDER — HYDROCODONE-ACETAMINOPHEN 5-325 MG PO TABS: 1.0000 | ORAL_TABLET | Freq: Three times a day (TID) | ORAL | 0 refills | Status: AC | PRN

## 2017-05-26 MED ORDER — KETOROLAC TROMETHAMINE 15 MG/ML IJ SOLN
INTRAMUSCULAR | Status: AC
  Filled 2017-05-26: qty 1

## 2017-05-26 MED ORDER — HYDROMORPHONE HCL 1 MG/ML IJ SOLN
0.5000 mg | Freq: Once | INTRAMUSCULAR | Status: AC
  Administered 2017-05-26: 0.5 mg via INTRAVENOUS
  Filled 2017-05-26: qty 1

## 2017-05-26 MED FILL — HYDROCODON-APAP 5-325: 5-325 | 5 days supply | Qty: 15 | Fill #0

## 2017-05-26 MED FILL — TAMSULOSIN HCL 0.4 MG CAP: 0.4 | 10 days supply | Qty: 10 | Fill #0

## 2017-05-26 NOTE — ED Provider Notes (Signed)
[facility] EMERGENCY DEPARTMENT Provider Note  CSN: [id number] Arrival date & time: 05/26/17 4098  Chief Complaint(s) Flank Pain  HPI VIAN FLUEGEL is a 57 y.o. male   The history is provided by the patient.  Flank Pain  This is a recurrent problem. The current episode started 2 days ago. The problem occurs constantly. Progression since onset: fluctuating. Pertinent negatives include no chest pain, no abdominal pain, no headaches and no shortness of breath. Nothing aggravates the symptoms. Nothing relieves the symptoms. Treatments tried: tylenol, NSAIDS, flomax. The treatment provided mild relief.   Similar to prior renal stones.  Past Medical History Past Medical History:  Diagnosis Date  . At risk for sleep apnea    STOP-BANG=  4     . Cirrhosis of liver without mention of alcohol    PER LIVER BX 03/2012  . Headache(784.0)    migraines  . History of kidney stones   . History of migraine   . Hypertension    NO MEDS  . Productive cough   . Right ureteral stone   . Smokers' cough (HCC)   . Type 2 diabetes, diet controlled [facility])    Patient Active Problem List   Diagnosis Date Noted  . Chronic cholecystitis with calculus 03/26/2012   Home Medication(s) Prior to Admission medications   Medication Sig Start Date End Date Taking? Authorizing Provider  amLODipine (NORVASC) 5 MG tablet Take 1 tablet (5 mg total) by mouth daily. 03/30/17  Yes Benjiman Core, MD  aspirin EC 81 MG tablet Take 81 mg by mouth daily.   Yes [provider]  metFORMIN (GLUCOPHAGE) 500 MG tablet Take 1 tablet (500 mg total) by mouth 2 (two) times daily with a meal. 03/30/17  Yes Benjiman Core, MD  HYDROcodone-acetaminophen (NORCO/VICODIN) 5-325 MG tablet Take 1 tablet by mouth every 8 (eight) hours as needed for up to 5 days for severe pain (That is not improved by your scheduled acetaminophen regimen). Please do not exceed 4000 mg of acetaminophen (Tylenol) a 24-hour period.  Please note that he may be prescribed additional medicine that contains acetaminophen. 05/26/17 05/31/17  Nira Conn, MD  ibuprofen (ADVIL,MOTRIN) 600 MG tablet Take 1 tablet (600 mg total) by mouth every 6 (six) hours as needed. 05/26/17   Oran Dillenburg, Amadeo Garnet, MD  metoCLOPramide (REGLAN) 10 MG tablet Take 1 tablet (10 mg total) by mouth every 8 (eight) hours as needed (headache). 10/15/15   Tomasita Crumble, MD  oxyCODONE-acetaminophen (PERCOCET/ROXICET) 5-325 MG tablet Take 1-2 tablets by mouth every 6 (six) hours as needed for severe pain. 08/02/16   Cathren Laine, MD  oxyCODONE-acetaminophen (PERCOCET/ROXICET) 5-325 MG tablet Take 1 tablet by mouth every 6 (six) hours as needed for severe pain. 03/07/17   Mackuen, Courteney Lyn, MD  tamsulosin (FLOMAX) 0.4 MG CAPS capsule Take 1 capsule (0.4 mg total) by mouth daily for 10 days. 05/26/17 06/05/17  Nira Conn, MD  Past Surgical History Past Surgical History:  Procedure Laterality Date  . CARDIOVASCULAR STRESS TEST  2004   CARDIOLITE STUDY NORMAL/  EF 60%  . CHOLECYSTECTOMY N/A 03/26/2012   Procedure: LAPAROSCOPIC CHOLECYSTECTOMY;  Surgeon: Romie Levee, MD;  Location: [facility];  Service: General;  Laterality: N/A;  . CYSTO/   RIGHT URETERAL STENT EXCHANGE  12/2007  . CYSTO/  RIGHT URETERAL STENT PLACEMENT  06/2006  . CYSTOSCOPY  07/11/2013   Procedure: CYSTOSCOPY RIGHT STENT REMOVAL;  Surgeon: Crist Fat, MD;  Location: [facility];  Service: Urology;;  . Bluford Kaufmann WITH RETROGRADE PYELOGRAM, URETEROSCOPY AND STENT PLACEMENT Right 05/15/2013   Procedure: CYSTOSCOPY WITH RETROGRADE PYELOGRAM, URETEROSCOPY AND STENT PLACEMENT;  Surgeon: Crist Fat, MD;  Location: [facility];  Service: Urology;  Laterality: Right;  . CYSTOSCOPY WITH RETROGRADE PYELOGRAM, URETEROSCOPY AND STENT PLACEMENT Right 05/30/2013    Procedure: CYSTOSCOPY WITH RIGHT RETROGRADE PYELOGRAM, URETEROSCOPY LASER LITHOTRIPSY AND STENT EXCHANGE ;  Surgeon: Crist Fat, MD;  Location: [facility];  Service: Urology;  Laterality: Right;  . EXTRACORPOREAL SHOCK WAVE LITHOTRIPSY    . HOLMIUM LASER APPLICATION Right 05/30/2013   Procedure: HOLMIUM LASER APPLICATION;  Surgeon: Crist Fat, MD;  Location: [facility];  Service: Urology;  Laterality: Right;  . LIVER BIOPSY N/A 03/26/2012   Procedure: LIVER BIOPSY;  Surgeon: Romie Levee, MD;  Location: [facility];  Service: General;  Laterality: N/A;   Family History Family History  Problem Relation Age of Onset  . Cancer - Lung Mother   . Cancer Mother   . Seizures Sister   . Heart disease Father     Social History Social History   Tobacco Use  . Smoking status: Current Every Day Smoker    Packs/day: 1.50    Years: 25.00    Pack years: 37.50    Types: Cigarettes  . Smokeless tobacco: Never Used  Substance Use Topics  . Alcohol use: No  . Drug use: No   Allergies Zofran [ondansetron hcl] and Compazine  Review of Systems Review of Systems  Respiratory: Negative for shortness of breath.   Cardiovascular: Negative for chest pain.  Gastrointestinal: Negative for abdominal pain.  Genitourinary: Positive for flank pain.  Neurological: Negative for headaches.   All other systems are reviewed and are negative for acute change except as noted in the HPI  Physical Exam Vital Signs  I have reviewed the triage vital signs BP (!) 162/96 (BP Location: Right Arm)   Pulse 60   Temp 97.7 F (36.5 C) (Oral)   Resp 18   Ht 6' (1.829 m)   Wt 115.2 kg (254 lb)   SpO2 99%   BMI 34.45 kg/m   Physical Exam  Constitutional: He is oriented to person, place, and time. He appears well-developed and well-nourished. No distress.  HENT:  Head: Normocephalic and atraumatic.  Right Ear: External ear normal.  Left Ear: External ear normal.    Nose: Nose normal.  Mouth/Throat: Mucous membranes are normal. No trismus in the jaw.  Eyes: Conjunctivae and EOM are normal. No scleral icterus.  Neck: Normal range of motion and phonation normal.  Cardiovascular: Normal rate and regular rhythm.  Pulmonary/Chest: Effort normal. No stridor. No respiratory distress.  Abdominal: He exhibits no distension. There is no tenderness. There is no rigidity, no rebound, no guarding and no CVA tenderness.  Musculoskeletal: Normal range of motion. He exhibits no edema.  Neurological: He is alert and oriented to person, place, and time.  Skin: He is not diaphoretic.  Psychiatric: He has a normal mood and affect. His behavior is normal.  Vitals reviewed.   ED Results and Treatments Labs (all labs ordered are listed, but only abnormal results are displayed) Labs Reviewed  URINALYSIS, ROUTINE W REFLEX MICROSCOPIC - Abnormal; Notable for the following components:      Result Value   Color, Urine AMBER (*)    APPearance CLOUDY (*)    Glucose, UA >=500 (*)    Hgb urine dipstick LARGE (*)    All other components within normal limits  CBC WITH DIFFERENTIAL/PLATELET - Abnormal; Notable for the following components:   RBC 6.16 (*)    Hemoglobin 18.4 (*)    MCHC 36.2 (*)    Platelets 109 (*)    All other components within normal limits  BASIC METABOLIC PANEL - Abnormal; Notable for the following components:   Chloride 99 (*)    Glucose, Bld 355 (*)    All other components within normal limits  URINALYSIS, MICROSCOPIC (REFLEX) - Abnormal; Notable for the following components:   Bacteria, UA RARE (*)    All other components within normal limits                                                                                                                         EKG  EKG Interpretation  Date/Time:    Ventricular Rate:    PR Interval:    QRS Duration:   QT Interval:    QTC Calculation:   R Axis:     Text Interpretation:         Radiology No results found. Pertinent labs & imaging results that were available during my care of the patient were reviewed by me and considered in my medical decision making (see chart for details).  Medications Ordered in ED Medications  ketorolac (TORADOL) 15 MG/ML injection (has no administration in time range)  ketorolac (TORADOL) 15 MG/ML injection (has no administration in time range)  ketorolac (TORADOL) 15 MG/ML injection 10 mg (10 mg Intravenous Given 05/26/17 0814)  ketorolac (TORADOL) 15 MG/ML injection 10 mg (10 mg Intravenous Given 05/26/17 0854)  HYDROmorphone (DILAUDID) injection 0.5 mg (0.5 mg Intravenous Given 05/26/17 0926)  Procedures Procedures  EMERGENCY DEPARTMENT US RENAL EXAM  "Study: Limited Retroperitoneal Ultrasound of Kidneys"  INDICATIONS: Flank pain Long and short axis of both kidneys were obtained.   PERFORMED BY: Myself IMAGES ARCHIVED?: Yes LIMITATIONS: Body habitus VIEWS USED: Long axis and Short axis  INTERPRETATION: Right Hydronephrosis mild    (including critical care time)  Medical Decision Making / ED Course I have reviewed the nursing notes for this encounter and the patient's prior records (if available in EHR or on provided paperwork).  Clinical Course as of May 26 1036  Thu May 26, 2017  1610 Patient here with 2 days of right flank pain consistent with recurrent renal colic from renal stones.  Bedside ultrasound revealed mild right hydronephrosis.  UA with hematuria but without evidence of urinary tract infection.  Screening labs obtain with intact renal function.  Patient provided with Toradol for pain relief.  On review of records, CT obtained 2 years ago did not reveal evidence of aortic aneurysm.  I have a low suspicion for such.   [PC]  1031 Minimal pain relief with Toradol.  Patient provided with  small dose of Dilaudid which resulted in significant improvement.   Labs grossly reassuring without leukocytosis.  Renal function intact.  He does have hyperglycemia without evidence of DKA.   The patient is safe for discharge with strict return precautions.    [PC]    Clinical Course User Index [PC] Wil Slape, Amadeo Garnet, MD   No active prescriptions on the narcotic database.  Final Clinical Impression(s) / ED Diagnoses Final diagnoses:  Ureteral colic    Disposition: Discharge  Condition: Good  I have discussed the results, Dx and Tx plan with the patient who expressed understanding and agree(s) with the plan. Discharge instructions discussed at great length. The patient was given strict return precautions who verbalized understanding of the instructions. No further questions at time of discharge.    ED Discharge Orders        Ordered    ibuprofen (ADVIL,MOTRIN) 600 MG tablet  Every 6 hours PRN     05/26/17 1035    HYDROcodone-acetaminophen (NORCO/VICODIN) 5-325 MG tablet  Every 8 hours PRN     05/26/17 1035    tamsulosin (FLOMAX) 0.4 MG CAPS capsule  Daily     05/26/17 1035       Follow Up: [facility] [address] [place] [address] [phone] Schedule an appointment as soon as possible for a visit  As needed     This chart was dictated using voice recognition software.  Despite best efforts to proofread,  errors can occur which can change the documentation meaning.   Nira Conn, MD 05/26/17 1038

## 2017-05-26 NOTE — ED Triage Notes (Signed)
Presents with right sided flank pain that began Tuesday assosciated with nausea. HE has a significant history if kidney stones and this feels the same. HE is taking flomax. HE has not tried any pain medication. HE is urinating well.

## 2017-12-04 ENCOUNTER — Emergency Department (HOSPITAL_BASED_OUTPATIENT_CLINIC_OR_DEPARTMENT_OTHER): Payer: Self-pay

## 2017-12-04 ENCOUNTER — Emergency Department (HOSPITAL_BASED_OUTPATIENT_CLINIC_OR_DEPARTMENT_OTHER)
Admission: EM | Admit: 2017-12-04 | Discharge: 2017-12-04 | Disposition: A | Discharge status: Home / Self Care | Payer: Self-pay | Attending: Emergency Medicine | Admitting: Emergency Medicine

## 2017-12-04 ENCOUNTER — Other Ambulatory Visit: Payer: Self-pay

## 2017-12-04 ENCOUNTER — Encounter (HOSPITAL_BASED_OUTPATIENT_CLINIC_OR_DEPARTMENT_OTHER): Payer: Self-pay | Admitting: Radiology

## 2017-12-04 DIAGNOSIS — Z7984 Long term (current) use of oral hypoglycemic drugs: Secondary | ICD-10-CM | POA: Insufficient documentation

## 2017-12-04 DIAGNOSIS — R69 Illness, unspecified: Secondary | ICD-10-CM

## 2017-12-04 DIAGNOSIS — Z79899 Other long term (current) drug therapy: Secondary | ICD-10-CM | POA: Insufficient documentation

## 2017-12-04 DIAGNOSIS — R1011 Right upper quadrant pain: Secondary | ICD-10-CM | POA: Insufficient documentation

## 2017-12-04 DIAGNOSIS — Z7982 Long term (current) use of aspirin: Secondary | ICD-10-CM | POA: Insufficient documentation

## 2017-12-04 DIAGNOSIS — I160 Hypertensive urgency: Secondary | ICD-10-CM | POA: Insufficient documentation

## 2017-12-04 DIAGNOSIS — E119 Type 2 diabetes mellitus without complications: Secondary | ICD-10-CM | POA: Insufficient documentation

## 2017-12-04 DIAGNOSIS — R911 Solitary pulmonary nodule: Secondary | ICD-10-CM | POA: Insufficient documentation

## 2017-12-04 DIAGNOSIS — E041 Nontoxic single thyroid nodule: Secondary | ICD-10-CM | POA: Insufficient documentation

## 2017-12-04 DIAGNOSIS — Z9114 Patient's other noncompliance with medication regimen: Secondary | ICD-10-CM | POA: Insufficient documentation

## 2017-12-04 DIAGNOSIS — F17210 Nicotine dependence, cigarettes, uncomplicated: Secondary | ICD-10-CM | POA: Insufficient documentation

## 2017-12-04 DIAGNOSIS — R109 Unspecified abdominal pain: Secondary | ICD-10-CM

## 2017-12-04 LAB — CBC WITH DIFFERENTIAL/PLATELET
ABS IMMATURE GRANULOCYTES: 0.02 10*3/uL (ref 0.00–0.07)
BASOS PCT: 1 %
Basophils Absolute: 0 10*3/uL (ref 0.0–0.1)
Eosinophils Absolute: 0.2 10*3/uL (ref 0.0–0.5)
Eosinophils Relative: 2 %
HEMATOCRIT: 50.1 % (ref 39.0–52.0)
Hemoglobin: 17 g/dL (ref 13.0–17.0)
Immature Granulocytes: 0 %
Lymphocytes Relative: 30 %
Lymphs Abs: 1.9 10*3/uL (ref 0.7–4.0)
MCH: 28.7 pg (ref 26.0–34.0)
MCHC: 33.9 g/dL (ref 30.0–36.0)
MCV: 84.5 fL (ref 80.0–100.0)
MONO ABS: 0.6 10*3/uL (ref 0.1–1.0)
MONOS PCT: 9 %
Neutro Abs: 3.7 10*3/uL (ref 1.7–7.7)
Neutrophils Relative %: 58 %
PLATELETS: 107 10*3/uL — AB (ref 150–400)
RBC: 5.93 MIL/uL — ABNORMAL HIGH (ref 4.22–5.81)
RDW: 12 % (ref 11.5–15.5)
WBC: 6.3 10*3/uL (ref 4.0–10.5)
nRBC: 0 % (ref 0.0–0.2)

## 2017-12-04 LAB — BASIC METABOLIC PANEL
Anion gap: 9 (ref 5–15)
BUN: 15 mg/dL (ref 6–20)
CO2: 27 mmol/L (ref 22–32)
Calcium: 9 mg/dL (ref 8.9–10.3)
Chloride: 98 mmol/L (ref 98–111)
Creatinine, Ser: 0.92 mg/dL (ref 0.61–1.24)
GFR calc Af Amer: >60 mL/min (ref >60)
GLUCOSE: 383 mg/dL — AB (ref 70–99)
POTASSIUM: 4 mmol/L (ref 3.5–5.1)
Sodium: 134 mmol/L — ABNORMAL LOW (ref 135–145)

## 2017-12-04 LAB — URINALYSIS, ROUTINE W REFLEX MICROSCOPIC
Bilirubin Urine: NEGATIVE
HGB URINE DIPSTICK: NEGATIVE
Ketones, ur: NEGATIVE mg/dL
LEUKOCYTES UA: NEGATIVE
Nitrite: NEGATIVE
PH: 6 (ref 5.0–8.0)
PROTEIN: NEGATIVE mg/dL
Specific Gravity, Urine: 1.02 (ref 1.005–1.030)

## 2017-12-04 LAB — HEPATIC FUNCTION PANEL
ALK PHOS: 73 U/L (ref 38–126)
ALT: 77 U/L — AB (ref 0–44)
AST: 50 U/L — AB (ref 15–41)
Albumin: 3.7 g/dL (ref 3.5–5.0)
BILIRUBIN DIRECT: 0.1 mg/dL (ref 0.0–0.2)
Indirect Bilirubin: 0.9 mg/dL (ref 0.3–0.9)
Total Bilirubin: 1 mg/dL (ref 0.3–1.2)
Total Protein: 6.7 g/dL (ref 6.5–8.1)

## 2017-12-04 LAB — ETHANOL: Alcohol, Ethyl (B): <10 mg/dL (ref <10)

## 2017-12-04 LAB — URINALYSIS, MICROSCOPIC (REFLEX)
RBC / HPF: NONE SEEN RBC/hpf (ref 0–5)
SQUAMOUS EPITHELIAL / LPF: NONE SEEN (ref 0–5)

## 2017-12-04 LAB — LIPASE, BLOOD: Lipase: 155 U/L — ABNORMAL HIGH (ref 11–51)

## 2017-12-04 MED ORDER — METFORMIN HCL 500 MG PO TABS: 500.0000 mg | ORAL_TABLET | Freq: Two times a day (BID) | ORAL | 0 refills | Status: DC

## 2017-12-04 MED ORDER — TRAMADOL HCL 50 MG PO TABS: ORAL_TABLET | ORAL | 0 refills | Status: DC

## 2017-12-04 MED ORDER — SODIUM CHLORIDE 0.9 % IV SOLN
Freq: Once | INTRAVENOUS | Status: AC
  Administered 2017-12-04: 07:00:00 via INTRAVENOUS

## 2017-12-04 MED ORDER — HYDROMORPHONE HCL 1 MG/ML IJ SOLN
1.0000 mg | Freq: Once | INTRAMUSCULAR | Status: AC
  Administered 2017-12-04: 1 mg via INTRAVENOUS
  Filled 2017-12-04: qty 1

## 2017-12-04 MED ORDER — IOPAMIDOL (ISOVUE-370) INJECTION 76%
100.0000 mL | Freq: Once | INTRAVENOUS | Status: AC | PRN
  Administered 2017-12-04: 100 mL via INTRAVENOUS

## 2017-12-04 MED ORDER — AMLODIPINE BESYLATE 10 MG PO TABS: 10.0000 mg | ORAL_TABLET | Freq: Every day | ORAL | 1 refills | Status: DC

## 2017-12-04 MED ORDER — ACETAMINOPHEN 325 MG PO TABS: 650.0000 mg | ORAL_TABLET | Freq: Four times a day (QID) | ORAL | 0 refills | Status: DC | PRN

## 2017-12-04 MED ORDER — FENTANYL CITRATE (PF) 100 MCG/2ML IJ SOLN
50.0000 ug | INTRAMUSCULAR | Status: DC | PRN
  Administered 2017-12-04: 50 ug via INTRAVENOUS
  Filled 2017-12-04: qty 2

## 2017-12-04 MED ORDER — AMLODIPINE BESYLATE 5 MG PO TABS
10.0000 mg | ORAL_TABLET | Freq: Once | ORAL | Status: AC
  Administered 2017-12-04: 10 mg via ORAL
  Filled 2017-12-04: qty 2

## 2017-12-04 MED ORDER — METHOCARBAMOL 500 MG PO TABS: 500.0000 mg | ORAL_TABLET | Freq: Four times a day (QID) | ORAL | 0 refills | Status: DC | PRN

## 2017-12-04 NOTE — ED Notes (Signed)
Patient transported to CT 

## 2017-12-04 NOTE — ED Triage Notes (Signed)
Pt states that he has had some ongoing flank pain for a couple weeks. But now having shoulder pain on the same side, Right. He has had some nausea but no vomiting. The flank pain is constant but the right shoulder blade is intermittent. Intermittent hematuria

## 2017-12-04 NOTE — ED Notes (Signed)
Pt is aware that we need a urine sample 

## 2017-12-04 NOTE — Discharge Instructions (Addendum)
1.  It is very important that you start seeing your family doctor again.  If you no longer have a family doctor, use the referral number in your discharge instructions to find one. 2.  Resume metformin twice a day and amlodipine once a day as prescribed.  Follow printed instructions for diabetes and nutrition. 3.  At this time, the pain in your back does not have a specific cause.  You have had a CT scan to rule out many emergent causes.  If your pain is worsening, new symptoms are developing you must return to the emergency department for recheck. 4.  Your CT scan showed a small nodule at the bottom of your lung.  The radiologist recommendation is for surveillance of this finding with repeat CT scans.  They also noted a small nodule in the thyroid which should have further evaluation.  Neither of these are contributing to your problems today.  They are however findings that should have routine monitoring to make sure they are not the early findings of a small cancer.

## 2017-12-04 NOTE — ED Provider Notes (Signed)
[facility] EMERGENCY DEPARTMENT Provider Note   CSN: [id number] Arrival date & time: 12/04/17  1308     History   Chief Complaint Chief Complaint  Patient presents with  . Flank Pain    HPI Michael Carroll is a 57 y.o. male.  HPI Patient reports he has a history of frequent kidney stones.  Typically he tries to pass some at home.  He reports normally he gets pain in a focal area in the flank and not much for associated symptoms.  He reports this time however he has had pain for over 2 weeks and instead of eventually resolving, it is gotten worse.  He reports now the pain radiates at times up towards his right shoulder.  He reports that never happened previously.  He reports now he also has some actual tenderness in the area of the flank.  He has been nauseated but not had vomiting episodes.  He reports he is continued to make urine without pain or burning.  He reports he has run out of his metformin.  That is been for about a month.  He reports typically, even without the metformin if he is careful with his diet he keeps his blood pressures controlled in the low 100s.  He reports now, since he has had this problem with persistent (what he suspected was a kidney stone), he has had a hard time managing his blood sugars although notably he has been out of the metformin.  He reports he is also run out of Norvasc for greater than 1-1/2 to 2 months.  Patient reports he does not smoke.  He reports he used to be a heavy smoker up to 2 packs/day.  He is now cut back to a pack or less a day.  Patient is already had a cholecystectomy.  Patient reports history of hep C.  He denies ever having had IVDA.  Suspected to be secondary to a very distant tattoo obtained in [place]. Past Medical History:  Diagnosis Date  . At risk for sleep apnea    STOP-BANG=  4     . Cirrhosis of liver without mention of alcohol    PER LIVER BX 03/2012  . Headache(784.0)    migraines  . History of kidney  stones   . History of migraine   . Hypertension    NO MEDS  . Productive cough   . Right ureteral stone   . Smokers' cough (HCC)   . Type 2 diabetes, diet controlled [facility])     Patient Active Problem List   Diagnosis Date Noted  . Chronic cholecystitis with calculus 03/26/2012    Past Surgical History:  Procedure Laterality Date  . CARDIOVASCULAR STRESS TEST  2004   CARDIOLITE STUDY NORMAL/  EF 60%  . CHOLECYSTECTOMY N/A 03/26/2012   Procedure: LAPAROSCOPIC CHOLECYSTECTOMY;  Surgeon: Romie Levee, MD;  Location: [facility];  Service: General;  Laterality: N/A;  . CYSTO/   RIGHT URETERAL STENT EXCHANGE  12/2007  . CYSTO/  RIGHT URETERAL STENT PLACEMENT  06/2006  . CYSTOSCOPY  07/11/2013   Procedure: CYSTOSCOPY RIGHT STENT REMOVAL;  Surgeon: Crist Fat, MD;  Location: [facility];  Service: Urology;;  . Bluford Kaufmann WITH RETROGRADE PYELOGRAM, URETEROSCOPY AND STENT PLACEMENT Right 05/15/2013   Procedure: CYSTOSCOPY WITH RETROGRADE PYELOGRAM, URETEROSCOPY AND STENT PLACEMENT;  Surgeon: Crist Fat, MD;  Location: [facility];  Service: Urology;  Laterality: Right;  . CYSTOSCOPY WITH RETROGRADE PYELOGRAM, URETEROSCOPY AND STENT PLACEMENT Right 05/30/2013  Procedure: CYSTOSCOPY WITH RIGHT RETROGRADE PYELOGRAM, URETEROSCOPY LASER LITHOTRIPSY AND STENT EXCHANGE ;  Surgeon: Crist Fat, MD;  Location: [facility];  Service: Urology;  Laterality: Right;  . EXTRACORPOREAL SHOCK WAVE LITHOTRIPSY    . HOLMIUM LASER APPLICATION Right 05/30/2013   Procedure: HOLMIUM LASER APPLICATION;  Surgeon: Crist Fat, MD;  Location: [facility];  Service: Urology;  Laterality: Right;  . LIVER BIOPSY N/A 03/26/2012   Procedure: LIVER BIOPSY;  Surgeon: Romie Levee, MD;  Location: [facility];  Service: General;  Laterality: N/A;        Home Medications    Prior to Admission medications   Medication Sig Start Date End Date Taking? Authorizing Provider  acetaminophen  (TYLENOL) 325 MG tablet Take 2 tablets (650 mg total) by mouth every 6 (six) hours as needed. 12/04/17   Arby Barrette, MD  amLODipine (NORVASC) 10 MG tablet Take 1 tablet (10 mg total) by mouth daily. 12/04/17   Arby Barrette, MD  amLODipine (NORVASC) 5 MG tablet Take 1 tablet (5 mg total) by mouth daily. 03/30/17   Benjiman Core, MD  aspirin EC 81 MG tablet Take 81 mg by mouth daily.    [provider]  ibuprofen (ADVIL,MOTRIN) 600 MG tablet Take 1 tablet (600 mg total) by mouth every 6 (six) hours as needed. 05/26/17   Nira Conn, MD  metFORMIN (GLUCOPHAGE) 500 MG tablet Take 1 tablet (500 mg total) by mouth 2 (two) times daily with a meal. 03/30/17   Benjiman Core, MD  metFORMIN (GLUCOPHAGE) 500 MG tablet Take 1 tablet (500 mg total) by mouth 2 (two) times daily with a meal. 12/04/17   Arby Barrette, MD  methocarbamol (ROBAXIN) 500 MG tablet Take 1 tablet (500 mg total) by mouth every 6 (six) hours as needed for muscle spasms. 12/04/17   Arby Barrette, MD  metoCLOPramide (REGLAN) 10 MG tablet Take 1 tablet (10 mg total) by mouth every 8 (eight) hours as needed (headache). 10/15/15   Tomasita Crumble, MD  oxyCODONE-acetaminophen (PERCOCET/ROXICET) 5-325 MG tablet Take 1-2 tablets by mouth every 6 (six) hours as needed for severe pain. 08/02/16   Cathren Laine, MD  oxyCODONE-acetaminophen (PERCOCET/ROXICET) 5-325 MG tablet Take 1 tablet by mouth every 6 (six) hours as needed for severe pain. 03/07/17   Mackuen, Cindee Salt, MD  traMADol Janean Sark) 50 MG tablet 1-2 tablets every 6 hours as needed for pain 12/04/17   Arby Barrette, MD    Family History Family History  Problem Relation Age of Onset  . Cancer - Lung Mother   . Cancer Mother   . Seizures Sister   . Heart disease Father     Social History Social History   Tobacco Use  . Smoking status: Current Every Day Smoker    Packs/day: 1.50    Years: 25.00    Pack years: 37.50    Types: Cigarettes  .  Smokeless tobacco: Never Used  Substance Use Topics  . Alcohol use: No  . Drug use: No     Allergies   Zofran [ondansetron hcl] and Compazine   Review of Systems Review of Systems  10 Systems reviewed and are negative for acute change except as noted in the HPI.  Physical Exam Updated Vital Signs BP (!) 180/103 (BP Location: Right Arm)   Pulse 92   Temp 97.7 F (36.5 C)   Resp 18   Ht 6' (1.829 m)   Wt 117.9 kg   SpO2 97%   BMI  35.26 kg/m   Physical Exam  Constitutional: He is oriented to person, place, and time.  Patient is alert and nontoxic.  He is sitting upright in the stretcher.  Mental status is clear.  No distress at rest.  Morbid obesity.  Mental status clear.  HENT:  Head: Normocephalic and atraumatic.  Mouth/Throat: Oropharynx is clear and moist.  Eyes: EOM are normal.  Cardiovascular: Normal rate, regular rhythm, normal heart sounds and intact distal pulses.  Pulmonary/Chest: Effort normal and breath sounds normal.  Abdominal: Soft.  Abdomen is soft.  Patient has moderately reproducible right upper quadrant tenderness to palpation.  Lower abdomen is nontender.  Some focal moderate tenderness to palpation over the right flank.  No visible or palpable anomalies.  Musculoskeletal: Normal range of motion. He exhibits no edema or tenderness.  No peripheral edema.  Calves are soft and nontender.  Neurological: He is alert and oriented to person, place, and time. He exhibits normal muscle tone. Coordination normal.  Skin: Skin is warm and dry.  Psychiatric: He has a normal mood and affect.     ED Treatments / Results  Labs (all labs ordered are listed, but only abnormal results are displayed) Labs Reviewed  CBC WITH DIFFERENTIAL/PLATELET - Abnormal; Notable for the following components:      Result Value   RBC 5.93 (*)    Platelets 107 (*)    All other components within normal limits  BASIC METABOLIC PANEL - Abnormal; Notable for the following components:    Sodium 134 (*)    Glucose, Bld 383 (*)    All other components within normal limits  URINALYSIS, ROUTINE W REFLEX MICROSCOPIC - Abnormal; Notable for the following components:   Glucose, UA >=500 (*)    All other components within normal limits  HEPATIC FUNCTION PANEL - Abnormal; Notable for the following components:   AST 50 (*)    ALT 77 (*)    All other components within normal limits  LIPASE, BLOOD - Abnormal; Notable for the following components:   Lipase 155 (*)    All other components within normal limits  URINALYSIS, MICROSCOPIC (REFLEX) - Abnormal; Notable for the following components:   Bacteria, UA FEW (*)    All other components within normal limits  ETHANOL    EKG None  Radiology Ct Renal Stone Study  Result Date: 12/04/2017 CLINICAL DATA:  Acute right flank pain, history of chronic kidney stones with multiple surgeries and stents EXAM: CT ABDOMEN AND PELVIS WITHOUT CONTRAST TECHNIQUE: Multidetector CT imaging of the abdomen and pelvis was performed following the standard protocol without IV contrast. COMPARISON:  Renal ultrasound dated 03/07/2017. CT abdomen/pelvis dated 02/10/2015. FINDINGS: Lower chest: Lung bases are clear. Hepatobiliary: Cirrhosis. Mild hepatic steatosis. No focal hepatic lesion is seen. Status post cholecystectomy. No intrahepatic or extrahepatic ductal dilatation. Pancreas: Within normal limits. Spleen: Mild splenomegaly, measuring 14.8 cm in maximal craniocaudal dimension. Adrenals/Urinary Tract: Adrenal glands are within normal limits. Two nonobstructing right renal calculi measuring up to 2-3 mm in the right upper pole (coronal image 81). Left kidney is within normal limits. No ureteral or bladder calculi. Chronic fullness of the right lower pole renal collecting system (coronal image 72). No frank hydronephrosis. Bladder is within normal limits. Stomach/Bowel: Stomach is within normal limits. No evidence of bowel obstruction. Normal appendix  (series 2/image 54). Vascular/Lymphatic: No evidence of abdominal aortic aneurysm. Atherosclerotic calcifications of the abdominal aorta and branch vessels. No suspicious abdominopelvic lymphadenopathy. Reproductive: Prostate is unremarkable, noting vascular calcifications. Other:  No abdominopelvic ascites. Musculoskeletal: Visualized osseous structures are within normal limits. IMPRESSION: Two nonobstructing right renal calculi measuring up to 3 mm in the right upper pole. No ureteral or bladder calculi. Chronic fullness of the right lower pole renal collecting system. No frank hydronephrosis. Cirrhosis. Mild hepatic steatosis. Mild splenomegaly. Electronically Signed   By: Charline BillsSriyesh  Krishnan M.D.   On: 12/04/2017 07:37   Ct Angio Chest/abd/pel For Dissection W And/or W/wo  Result Date: 12/04/2017 CLINICAL DATA:  Right-sided flank pain and chest pain, initial encounter EXAM: CT ANGIOGRAPHY CHEST, ABDOMEN AND PELVIS TECHNIQUE: Multidetector CT imaging through the chest, abdomen and pelvis was performed using the standard protocol during bolus administration of intravenous contrast. Multiplanar reconstructed images and MIPs were obtained and reviewed to evaluate the vascular anatomy. CONTRAST:  100mL ISOVUE-370 IOPAMIDOL (ISOVUE-370) INJECTION 76% COMPARISON:  Stone study from earlier in the same day as well as prior CTs dating back to 2016. FINDINGS: CTA CHEST FINDINGS Cardiovascular: Thoracic aorta demonstrates no significant atherosclerotic calcifications. Normal branching pattern is noted. No aneurysmal dilatation or dissection is seen. The pulmonary artery as visualized shows no central pulmonary embolus. No cardiac enlargement is seen. Mediastinum/Nodes: The esophagus as visualized is within normal limits. The thoracic inlet demonstrates a 13 mm hypodensity within the right lobe of the thyroid likely representing a small nodule. The need for further evaluation can be determined on a clinical basis. No hilar  or mediastinal adenopathy is identified. Lungs/Pleura: Lungs are well aerated bilaterally. A tiny subpleural nodule is noted in the right lower lobe best seen on image number 42 of series 7. This is stable from multiple previous exams dating back to 2016 and consistent with a benign etiology. No focal infiltrate or sizable effusion is seen. In the left lower lobe best seen on image number 30 of series 7 there is a 5 mm nodular density identified which has not been covered on prior CTs of the abdomen and pelvis. No effusion or pneumothorax is seen. Musculoskeletal: Mild degenerative changes of the thoracic spine are noted. Review of the MIP images confirms the above findings. CTA ABDOMEN AND PELVIS FINDINGS VASCULAR Aorta: Abdominal aorta demonstrates minimal atherosclerotic calcifications. No aneurysmal dilatation or dissection is seen. Celiac: Patent without evidence of aneurysm, dissection, vasculitis or significant stenosis. SMA: Patent without evidence of aneurysm, dissection, vasculitis or significant stenosis. Renals: Dual renal arteries are noted bilaterally. No static changes are identified. IMA: Patent without evidence of aneurysm, dissection, vasculitis or significant stenosis. Iliacs: Mild atherosclerotic changes are noted without focal stenosis or aneurysmal dilatation. Veins: Within normal limits. Review of the MIP images confirms the above findings. NON-VASCULAR Hepatobiliary: The liver demonstrates some mild nodularity consistent with early cirrhotic change. No focal enhancing mass is noted. The gallbladder has been surgically removed. Pancreas: Unremarkable. No pancreatic ductal dilatation or surrounding inflammatory changes. Spleen: Normal in size without focal abnormality. Adrenals/Urinary Tract: The adrenal glands are within normal limits bilaterally. Persistent prominence of the right renal collecting system is seen stable from the recent stone study. The previously seen renal calculi are not as  well appreciated on this exam. No ureteral stones are seen. The bladder is partially distended. Stomach/Bowel: Stomach is within normal limits. Appendix appears normal. No evidence of bowel wall thickening, distention, or inflammatory changes. Lymphatic: No significant lymphadenopathy is noted. Reproductive: Prostate is unremarkable. Other: No abdominal wall hernia or abnormality. No abdominopelvic ascites. Musculoskeletal: No acute or significant osseous findings. Review of the MIP images confirms the above findings. IMPRESSION: Very mild atherosclerotic  changes of the abdominal aorta. No evidence of aneurysmal dilatation or dissection is noted in the chest abdomen or pelvis. Mild changes of cirrhosis. 5 mm left lower lobe nodule. No follow-up needed if patient is low-risk. Non-contrast chest CT can be considered in 12 months if patient is high-risk. This recommendation follows the consensus statement: Guidelines for Management of Incidental Pulmonary Nodules Detected on CT Images: From the Fleischner Society 2017; Radiology 2017; 284:228-243. Stable dilatation of the right renal collecting system when compared with the prior exams. Hypodensity within the right lobe of the thyroid. The need for ultrasound evaluation on a nonemergent basis can be determined on a clinical basis. Electronically Signed   By: Alcide Clever M.D.   On: 12/04/2017 09:31    Procedures Procedures (including critical care time)  Medications Ordered in ED Medications  fentaNYL (SUBLIMAZE) injection 50 mcg (50 mcg Intravenous Given 12/04/17 0652)  0.9 %  sodium chloride infusion ( Intravenous Stopped 12/04/17 1013)  HYDROmorphone (DILAUDID) injection 1 mg (1 mg Intravenous Given 12/04/17 0710)  amLODipine (NORVASC) tablet 10 mg (10 mg Oral Given 12/04/17 0843)  HYDROmorphone (DILAUDID) injection 1 mg (1 mg Intravenous Given 12/04/17 0844)  iopamidol (ISOVUE-370) 76 % injection 100 mL (100 mLs Intravenous Contrast Given 12/04/17  0848)     Initial Impression / Assessment and Plan / ED Course  I have reviewed the triage vital signs and the nursing notes.  Pertinent labs & imaging results that were available during my care of the patient were reviewed by me and considered in my medical decision making (see chart for details).    Patient has had several weeks of right flank pain.  He assumed it was a kidney stone which she has had in the past.  At this time with symptoms worsening and no resolution, he is presenting for evaluation.  Patient has been noncompliant with his blood pressure medications and metformin due to having run out.  CTA obtained and no evidence of dissection.  No emergent findings.  Patient is made aware of noted small pulmonary nodule and thyroid nodule which are noncontributory to the presentation at this time.  Patient's blood pressure is improved with pain control   Final Clinical Impressions(s) / ED Diagnoses   Final diagnoses:  Flank pain  Hypertensive urgency  Severe comorbid illness    ED Discharge Orders         Ordered    amLODipine (NORVASC) 10 MG tablet  Daily     12/04/17 1015    metFORMIN (GLUCOPHAGE) 500 MG tablet  2 times daily with meals     12/04/17 1015    acetaminophen (TYLENOL) 325 MG tablet  Every 6 hours PRN     12/04/17 1015    traMADol (ULTRAM) 50 MG tablet     12/04/17 1015    methocarbamol (ROBAXIN) 500 MG tablet  Every 6 hours PRN     12/04/17 1015           Arby Barrette, MD 12/15/17 1655

## 2018-05-28 ENCOUNTER — Encounter (HOSPITAL_BASED_OUTPATIENT_CLINIC_OR_DEPARTMENT_OTHER): Payer: Self-pay | Admitting: *Deleted

## 2018-05-28 ENCOUNTER — Emergency Department (HOSPITAL_BASED_OUTPATIENT_CLINIC_OR_DEPARTMENT_OTHER): Payer: Self-pay

## 2018-05-28 ENCOUNTER — Other Ambulatory Visit: Payer: Self-pay

## 2018-05-28 ENCOUNTER — Emergency Department (HOSPITAL_BASED_OUTPATIENT_CLINIC_OR_DEPARTMENT_OTHER)
Admission: EM | Admit: 2018-05-28 | Discharge: 2018-05-28 | Disposition: A | Discharge status: Home / Self Care | Payer: Self-pay | Attending: Emergency Medicine | Admitting: Emergency Medicine

## 2018-05-28 DIAGNOSIS — Z79899 Other long term (current) drug therapy: Secondary | ICD-10-CM | POA: Insufficient documentation

## 2018-05-28 DIAGNOSIS — I10 Essential (primary) hypertension: Secondary | ICD-10-CM | POA: Insufficient documentation

## 2018-05-28 DIAGNOSIS — R739 Hyperglycemia, unspecified: Secondary | ICD-10-CM

## 2018-05-28 DIAGNOSIS — R1031 Right lower quadrant pain: Secondary | ICD-10-CM | POA: Insufficient documentation

## 2018-05-28 DIAGNOSIS — N200 Calculus of kidney: Secondary | ICD-10-CM | POA: Insufficient documentation

## 2018-05-28 DIAGNOSIS — F17210 Nicotine dependence, cigarettes, uncomplicated: Secondary | ICD-10-CM | POA: Insufficient documentation

## 2018-05-28 DIAGNOSIS — R109 Unspecified abdominal pain: Secondary | ICD-10-CM

## 2018-05-28 DIAGNOSIS — E1165 Type 2 diabetes mellitus with hyperglycemia: Secondary | ICD-10-CM | POA: Insufficient documentation

## 2018-05-28 DIAGNOSIS — Z7984 Long term (current) use of oral hypoglycemic drugs: Secondary | ICD-10-CM | POA: Insufficient documentation

## 2018-05-28 HISTORY — DX: Calculus of kidney: N20.0

## 2018-05-28 LAB — URINALYSIS, ROUTINE W REFLEX MICROSCOPIC
Bilirubin Urine: NEGATIVE
Glucose, UA: >=500 mg/dL — AB
Hgb urine dipstick: NEGATIVE
Ketones, ur: NEGATIVE mg/dL
Leukocytes,Ua: NEGATIVE
Nitrite: NEGATIVE
Protein, ur: NEGATIVE mg/dL
Specific Gravity, Urine: 1.02 (ref 1.005–1.030)
pH: 6 (ref 5.0–8.0)

## 2018-05-28 LAB — CBC WITH DIFFERENTIAL/PLATELET
Abs Immature Granulocytes: 0.01 10*3/uL (ref 0.00–0.07)
Basophils Absolute: 0 10*3/uL (ref 0.0–0.1)
Basophils Relative: 1 %
Eosinophils Absolute: 0.1 10*3/uL (ref 0.0–0.5)
Eosinophils Relative: 1 %
HCT: 53.3 % — ABNORMAL HIGH (ref 39.0–52.0)
Hemoglobin: 18.3 g/dL — ABNORMAL HIGH (ref 13.0–17.0)
Immature Granulocytes: 0 %
Lymphocytes Relative: 26 %
Lymphs Abs: 1.7 10*3/uL (ref 0.7–4.0)
MCH: 29 pg (ref 26.0–34.0)
MCHC: 34.3 g/dL (ref 30.0–36.0)
MCV: 84.6 fL (ref 80.0–100.0)
Monocytes Absolute: 0.5 10*3/uL (ref 0.1–1.0)
Monocytes Relative: 7 %
Neutro Abs: 4.3 10*3/uL (ref 1.7–7.7)
Neutrophils Relative %: 65 %
Platelets: 120 10*3/uL — ABNORMAL LOW (ref 150–400)
RBC: 6.3 MIL/uL — ABNORMAL HIGH (ref 4.22–5.81)
RDW: 12.2 % (ref 11.5–15.5)
WBC: 6.7 10*3/uL (ref 4.0–10.5)
nRBC: 0 % (ref 0.0–0.2)

## 2018-05-28 LAB — COMPREHENSIVE METABOLIC PANEL
ALT: 73 U/L — ABNORMAL HIGH (ref 0–44)
AST: 44 U/L — ABNORMAL HIGH (ref 15–41)
Albumin: 4.1 g/dL (ref 3.5–5.0)
Alkaline Phosphatase: 90 U/L (ref 38–126)
Anion gap: 10 (ref 5–15)
BUN: 16 mg/dL (ref 6–20)
CO2: 26 mmol/L (ref 22–32)
Calcium: 9.6 mg/dL (ref 8.9–10.3)
Chloride: 100 mmol/L (ref 98–111)
Creatinine, Ser: 0.94 mg/dL (ref 0.61–1.24)
GFR calc Af Amer: >60 mL/min (ref >60)
GFR calc non Af Amer: >60 mL/min (ref >60)
Glucose, Bld: 401 mg/dL — ABNORMAL HIGH (ref 70–99)
Potassium: 3.8 mmol/L (ref 3.5–5.1)
Sodium: 136 mmol/L (ref 135–145)
Total Bilirubin: 1.1 mg/dL (ref 0.3–1.2)
Total Protein: 7.2 g/dL (ref 6.5–8.1)

## 2018-05-28 LAB — URINALYSIS, MICROSCOPIC (REFLEX)

## 2018-05-28 LAB — LIPASE, BLOOD: Lipase: 142 U/L — ABNORMAL HIGH (ref 11–51)

## 2018-05-28 LAB — CBG MONITORING, ED: Glucose-Capillary: 420 mg/dL — ABNORMAL HIGH (ref 70–99)

## 2018-05-28 MED ORDER — MORPHINE SULFATE (PF) 4 MG/ML IV SOLN
4.0000 mg | Freq: Once | INTRAVENOUS | Status: AC
  Administered 2018-05-28: 4 mg via INTRAVENOUS
  Filled 2018-05-28: qty 1

## 2018-05-28 NOTE — ED Triage Notes (Signed)
Pt reports right flank pain since last night. Hx of kidney stones.

## 2018-05-28 NOTE — Discharge Instructions (Signed)
Please schedule an appointment with your primary care doctor.  I have given you the information for [facility] as they see patients who do not have insurance.  Your blood pressure was high while in the department.  This may be worsened by pain.  Please make sure that you are taking your medications.    Your scans today were reassuring, and your blood work appears at your baseline.  Today you received medications that may make you sleepy or impair your ability to make decisions.  For the next 24 hours please do not drive, operate heavy machinery, care for a small child with out another adult present, or perform any activities that may cause harm to you or someone else if you were to fall asleep or be impaired.

## 2018-05-28 NOTE — ED Provider Notes (Signed)
[facility] EMERGENCY DEPARTMENT Provider Note   CSN: [id number] Arrival date & time: 05/28/18  2022    History   Chief Complaint Chief Complaint  Patient presents with  . Flank Pain    HPI Michael Carroll is a 58 y.o. male with a past medical history of DM 2, hypertension, kidney stones, liver cirrhosis, who presents today for evaluation of right-sided flank pain.  He reports that this pain started last night.  It is in the same location as his kidney stone pain usually is however he is not sure if this is his kidney stone pain.  He reports that he is also sore on the right side and his abdomen.  He says that usually when he has kidney stones he notices hematuria and will past 1 or 2 small stones before he has a large painful 1 however he has not had either of those.    He also reports that he has not been taking his blood pressure medicine for the past week, and has not been taking his metformin regularly.  He reports that his blood sugar normally runs between 300-500.  He does not have a primary care doctor.    HPI  Past Medical History:  Diagnosis Date  . At risk for sleep apnea    STOP-BANG=  4     . Cirrhosis of liver without mention of alcohol    PER LIVER BX 03/2012  . Headache(784.0)    migraines  . History of kidney stones   . History of migraine   . Hypertension    NO MEDS  . Kidney stone   . Productive cough   . Right ureteral stone   . Smokers' cough (HCC)   . Type 2 diabetes, diet controlled [facility])     Patient Active Problem List   Diagnosis Date Noted  . Chronic cholecystitis with calculus 03/26/2012    Past Surgical History:  Procedure Laterality Date  . CARDIOVASCULAR STRESS TEST  2004   CARDIOLITE STUDY NORMAL/  EF 60%  . CHOLECYSTECTOMY N/A 03/26/2012   Procedure: LAPAROSCOPIC CHOLECYSTECTOMY;  Surgeon: Romie Levee, MD;  Location: [facility];  Service: General;  Laterality: N/A;  . CYSTO/   RIGHT URETERAL STENT EXCHANGE  12/2007  .  CYSTO/  RIGHT URETERAL STENT PLACEMENT  06/2006  . CYSTOSCOPY  07/11/2013   Procedure: CYSTOSCOPY RIGHT STENT REMOVAL;  Surgeon: Crist Fat, MD;  Location: [facility];  Service: Urology;;  . Bluford Kaufmann WITH RETROGRADE PYELOGRAM, URETEROSCOPY AND STENT PLACEMENT Right 05/15/2013   Procedure: CYSTOSCOPY WITH RETROGRADE PYELOGRAM, URETEROSCOPY AND STENT PLACEMENT;  Surgeon: Crist Fat, MD;  Location: [facility];  Service: Urology;  Laterality: Right;  . CYSTOSCOPY WITH RETROGRADE PYELOGRAM, URETEROSCOPY AND STENT PLACEMENT Right 05/30/2013   Procedure: CYSTOSCOPY WITH RIGHT RETROGRADE PYELOGRAM, URETEROSCOPY LASER LITHOTRIPSY AND STENT EXCHANGE ;  Surgeon: Crist Fat, MD;  Location: [facility];  Service: Urology;  Laterality: Right;  . EXTRACORPOREAL SHOCK WAVE LITHOTRIPSY    . HOLMIUM LASER APPLICATION Right 05/30/2013   Procedure: HOLMIUM LASER APPLICATION;  Surgeon: Crist Fat, MD;  Location: [facility];  Service: Urology;  Laterality: Right;  . LIVER BIOPSY N/A 03/26/2012   Procedure: LIVER BIOPSY;  Surgeon: Romie Levee, MD;  Location: [facility];  Service: General;  Laterality: N/A;        Home Medications    Prior to Admission medications   Medication Sig Start Date End Date Taking? Authorizing Provider  acetaminophen (  TYLENOL) 325 MG tablet Take 2 tablets (650 mg total) by mouth every 6 (six) hours as needed. 12/04/17   Arby Barrette, MD  amLODipine (NORVASC) 10 MG tablet Take 1 tablet (10 mg total) by mouth daily. 12/04/17   Arby Barrette, MD  amLODipine (NORVASC) 5 MG tablet Take 1 tablet (5 mg total) by mouth daily. 03/30/17   Benjiman Core, MD  aspirin EC 81 MG tablet Take 81 mg by mouth daily.    [provider]  ibuprofen (ADVIL,MOTRIN) 600 MG tablet Take 1 tablet (600 mg total) by mouth every 6 (six) hours as needed. 05/26/17   Nira Conn, MD  metFORMIN (GLUCOPHAGE) 500 MG tablet Take 1 tablet (500 mg  total) by mouth 2 (two) times daily with a meal. 03/30/17   Benjiman Core, MD  metFORMIN (GLUCOPHAGE) 500 MG tablet Take 1 tablet (500 mg total) by mouth 2 (two) times daily with a meal. 12/04/17   Arby Barrette, MD  methocarbamol (ROBAXIN) 500 MG tablet Take 1 tablet (500 mg total) by mouth every 6 (six) hours as needed for muscle spasms. 12/04/17   Arby Barrette, MD  metoCLOPramide (REGLAN) 10 MG tablet Take 1 tablet (10 mg total) by mouth every 8 (eight) hours as needed (headache). 10/15/15   Tomasita Crumble, MD  oxyCODONE-acetaminophen (PERCOCET/ROXICET) 5-325 MG tablet Take 1-2 tablets by mouth every 6 (six) hours as needed for severe pain. 08/02/16   Cathren Laine, MD  oxyCODONE-acetaminophen (PERCOCET/ROXICET) 5-325 MG tablet Take 1 tablet by mouth every 6 (six) hours as needed for severe pain. 03/07/17   Mackuen, Cindee Salt, MD  traMADol Janean Sark) 50 MG tablet 1-2 tablets every 6 hours as needed for pain 12/04/17   Arby Barrette, MD    Family History Family History  Problem Relation Age of Onset  . Cancer - Lung Mother   . Cancer Mother   . Seizures Sister   . Heart disease Father     Social History Social History   Tobacco Use  . Smoking status: Current Every Day Smoker    Packs/day: 1.50    Years: 25.00    Pack years: 37.50    Types: Cigarettes  . Smokeless tobacco: Never Used  Substance Use Topics  . Alcohol use: No  . Drug use: No     Allergies   Zofran [ondansetron hcl] and Compazine   Review of Systems Review of Systems  Constitutional: Negative for chills and fever.  HENT: Negative for congestion.   Respiratory: Negative for cough, chest tightness and shortness of breath.   Cardiovascular: Negative for chest pain.  Gastrointestinal: Positive for abdominal pain.  Genitourinary: Positive for flank pain.  All other systems reviewed and are negative.    Physical Exam Updated Vital Signs BP (!) 177/102   Pulse 68   Temp 98.5 F (36.9 C) (Oral)    Resp 18   Ht 6' (1.829 m)   Wt 112.5 kg   SpO2 97%   BMI 33.63 kg/m   Physical Exam Vitals signs and nursing note reviewed.  Constitutional:      Appearance: He is well-developed. He is obese.  HENT:     Head: Normocephalic and atraumatic.  Eyes:     Conjunctiva/sclera: Conjunctivae normal.  Neck:     Musculoskeletal: Normal range of motion and neck supple.  Cardiovascular:     Rate and Rhythm: Normal rate and regular rhythm.     Pulses: Normal pulses.     Heart sounds: Normal heart sounds. No murmur.  Pulmonary:     Effort: Pulmonary effort is normal. No respiratory distress.     Breath sounds: Normal breath sounds.  Abdominal:     Palpations: Abdomen is soft.     Tenderness: There is no abdominal tenderness. There is no right CVA tenderness or left CVA tenderness.  Musculoskeletal:     Right lower leg: No edema.     Left lower leg: No edema.  Skin:    General: Skin is warm and dry.  Neurological:     General: No focal deficit present.     Mental Status: He is alert.  Psychiatric:        Mood and Affect: Mood normal.        Behavior: Behavior normal.      ED Treatments / Results  Labs (all labs ordered are listed, but only abnormal results are displayed) Labs Reviewed  URINALYSIS, ROUTINE W REFLEX MICROSCOPIC - Abnormal; Notable for the following components:      Result Value   Glucose, UA >=500 (*)    All other components within normal limits  COMPREHENSIVE METABOLIC PANEL - Abnormal; Notable for the following components:   Glucose, Bld 401 (*)    AST 44 (*)    ALT 73 (*)    All other components within normal limits  LIPASE, BLOOD - Abnormal; Notable for the following components:   Lipase 142 (*)    All other components within normal limits  CBC WITH DIFFERENTIAL/PLATELET - Abnormal; Notable for the following components:   RBC 6.30 (*)    Hemoglobin 18.3 (*)    HCT 53.3 (*)    Platelets 120 (*)    All other components within normal limits  URINALYSIS,  MICROSCOPIC (REFLEX) - Abnormal; Notable for the following components:   Bacteria, UA RARE (*)    All other components within normal limits  CBG MONITORING, ED - Abnormal; Notable for the following components:   Glucose-Capillary 420 (*)    All other components within normal limits    EKG EKG Interpretation  Date/Time:  Sunday May 28 2018 21:23:20 EDT Ventricular Rate:  62 PR Interval:    QRS Duration: 97 QT Interval:  440 QTC Calculation: 447 R Axis:   -76 Text Interpretation:  Sinus rhythm Abnormal R-wave progression, late transition Inferior infarct, old Confirmed by Geoffery Lyons ([id number]) on 05/28/2018 10:00:53 PM   Radiology Ct Renal Stone Study  Result Date: 05/28/2018 CLINICAL DATA:  Right flank pain EXAM: CT ABDOMEN AND PELVIS WITHOUT CONTRAST TECHNIQUE: Multidetector CT imaging of the abdomen and pelvis was performed following the standard protocol without IV contrast. COMPARISON:  12/04/2017 FINDINGS: Lower chest: No acute abnormality. Hepatobiliary: No focal liver abnormality is seen. Status post cholecystectomy. No biliary dilatation. Pancreas: Unremarkable. No pancreatic ductal dilatation or surrounding inflammatory changes. Spleen: Normal in size without focal abnormality. Adrenals/Urinary Tract: Adrenal glands are unremarkable. Redemonstrated tiny nonobstructive calculi of the right kidney, unchanged from prior examination. Bladder is unremarkable. Stomach/Bowel: Stomach is within normal limits. Appendix appears normal. No evidence of bowel wall thickening, distention, or inflammatory changes. Sigmoid diverticulosis. Vascular/Lymphatic: Scattered calcific atherosclerosis. No enlarged abdominal or pelvic lymph nodes. Reproductive: No mass or other abnormality. Other: No abdominal wall hernia or abnormality. No abdominopelvic ascites. Musculoskeletal: No acute or significant osseous findings. IMPRESSION: 1. Tiny nonobstructive right renal calculi without hydronephrosis or  hydroureter. 2.  Normal appendix. 3.  Chronic and incidental findings as detailed above. Electronically Signed   By: Lauralyn Primes M.D.   On: 05/28/2018 21:36  Procedures Procedures (including critical care time)  Medications Ordered in ED Medications  morphine 4 MG/ML injection 4 mg (4 mg Intravenous Given 05/28/18 2115)     Initial Impression / Assessment and Plan / ED Course  I have reviewed the triage vital signs and the nursing notes.  Pertinent labs & imaging results that were available during my care of the patient were reviewed by me and considered in my medical decision making (see chart for details).  Clinical Course as of May 27 2309  Sun May 28, 2018  2200 Current vitals BP 172/98, HR 60.    [EH]    Clinical Course User Index [EH] Cristina GongHammond, Elizabeth W, PA-C      Patient presents today for evaluation of flank pain.  He has a history of kidney stones, however is unsure if this is the kidney stone related.  He has previously had a dissection scan which was unremarkable.  CBC and BMP are consistent with his baseline.  He is lipase is elevated at 142 which is consistent with his baseline.  His urine did not have significant hematuria, however does have over 500 glucose.  He has been noncompliant with his anti-hyperglycemics and his antihypertension medicines at home.  His blood pressure was elevated while in the emergency room.  His sugar is elevated at 420 which is consistent with his baseline according to patient.  His pain was treated with morphine while in the ER.  Based on the unclear nature of his symptoms, CT renal stone study was obtained which did not show any evidence of obstructing stones, or other significant acute abnormalities or cause for his pain.    EKG was obtained without evidence of ischemia.  He is not having chest pain or shortness of breath.  Suspect that his pain may be musculoskeletal in nature.   This patient was seen as a shared visit with Dr.Delo.     Patient was given resources to assist in establishing care with a primary care provider.  Return precautions were discussed with patient who states their understanding.  At the time of discharge patient denied any unaddressed complaints or concerns.  Patient is agreeable for discharge home.    Final Clinical Impressions(s) / ED Diagnoses   Final diagnoses:  Flank pain  Hyperglycemia  Hypertension, unspecified type    ED Discharge Orders    None       Cristina GongHammond, Elizabeth W, PA-C 05/28/18 2316    Geoffery Lyonselo, Douglas, MD 06/01/18 [phone]

## 2018-08-07 ENCOUNTER — Emergency Department (HOSPITAL_COMMUNITY): Payer: Self-pay

## 2018-08-07 ENCOUNTER — Other Ambulatory Visit: Payer: Self-pay

## 2018-08-07 ENCOUNTER — Emergency Department (HOSPITAL_COMMUNITY)
Admission: EM | Admit: 2018-08-07 | Discharge: 2018-08-08 | Disposition: A | Discharge status: Home / Self Care | Payer: Self-pay | Attending: Emergency Medicine | Admitting: Emergency Medicine

## 2018-08-07 ENCOUNTER — Encounter (HOSPITAL_COMMUNITY): Payer: Self-pay | Admitting: Emergency Medicine

## 2018-08-07 DIAGNOSIS — Y998 Other external cause status: Secondary | ICD-10-CM | POA: Insufficient documentation

## 2018-08-07 DIAGNOSIS — Z7984 Long term (current) use of oral hypoglycemic drugs: Secondary | ICD-10-CM | POA: Insufficient documentation

## 2018-08-07 DIAGNOSIS — Y929 Unspecified place or not applicable: Secondary | ICD-10-CM | POA: Insufficient documentation

## 2018-08-07 DIAGNOSIS — S37011A Minor contusion of right kidney, initial encounter: Secondary | ICD-10-CM | POA: Insufficient documentation

## 2018-08-07 DIAGNOSIS — R1011 Right upper quadrant pain: Secondary | ICD-10-CM | POA: Insufficient documentation

## 2018-08-07 DIAGNOSIS — W11XXXA Fall on and from ladder, initial encounter: Secondary | ICD-10-CM | POA: Insufficient documentation

## 2018-08-07 DIAGNOSIS — Z79899 Other long term (current) drug therapy: Secondary | ICD-10-CM | POA: Insufficient documentation

## 2018-08-07 DIAGNOSIS — F17210 Nicotine dependence, cigarettes, uncomplicated: Secondary | ICD-10-CM | POA: Insufficient documentation

## 2018-08-07 DIAGNOSIS — Y9389 Activity, other specified: Secondary | ICD-10-CM | POA: Insufficient documentation

## 2018-08-07 DIAGNOSIS — E1365 Other specified diabetes mellitus with hyperglycemia: Secondary | ICD-10-CM

## 2018-08-07 DIAGNOSIS — E1165 Type 2 diabetes mellitus with hyperglycemia: Secondary | ICD-10-CM | POA: Insufficient documentation

## 2018-08-07 DIAGNOSIS — I10 Essential (primary) hypertension: Secondary | ICD-10-CM | POA: Insufficient documentation

## 2018-08-07 DIAGNOSIS — N200 Calculus of kidney: Secondary | ICD-10-CM | POA: Insufficient documentation

## 2018-08-07 LAB — BASIC METABOLIC PANEL
Anion gap: 8 (ref 5–15)
BUN: 16 mg/dL (ref 6–20)
CO2: 23 mmol/L (ref 22–32)
Calcium: 9.6 mg/dL (ref 8.9–10.3)
Chloride: 105 mmol/L (ref 98–111)
Creatinine, Ser: 0.85 mg/dL (ref 0.61–1.24)
GFR calc Af Amer: >60 mL/min (ref >60)
GFR calc non Af Amer: >60 mL/min (ref >60)
Glucose, Bld: 304 mg/dL — ABNORMAL HIGH (ref 70–99)
Potassium: 4.1 mmol/L (ref 3.5–5.1)
Sodium: 136 mmol/L (ref 135–145)

## 2018-08-07 LAB — CBC WITH DIFFERENTIAL/PLATELET
Abs Immature Granulocytes: 0.02 10*3/uL (ref 0.00–0.07)
Basophils Absolute: 0 10*3/uL (ref 0.0–0.1)
Basophils Relative: 0 %
Eosinophils Absolute: 0.2 10*3/uL (ref 0.0–0.5)
Eosinophils Relative: 2 %
HCT: 53.2 % — ABNORMAL HIGH (ref 39.0–52.0)
Hemoglobin: 18.5 g/dL — ABNORMAL HIGH (ref 13.0–17.0)
Immature Granulocytes: 0 %
Lymphocytes Relative: 24 %
Lymphs Abs: 2.1 10*3/uL (ref 0.7–4.0)
MCH: 29.2 pg (ref 26.0–34.0)
MCHC: 34.8 g/dL (ref 30.0–36.0)
MCV: 84 fL (ref 80.0–100.0)
Monocytes Absolute: 0.5 10*3/uL (ref 0.1–1.0)
Monocytes Relative: 6 %
Neutro Abs: 6.1 10*3/uL (ref 1.7–7.7)
Neutrophils Relative %: 68 %
Platelets: 120 10*3/uL — ABNORMAL LOW (ref 150–400)
RBC: 6.33 MIL/uL — ABNORMAL HIGH (ref 4.22–5.81)
RDW: 12.2 % (ref 11.5–15.5)
WBC: 9 10*3/uL (ref 4.0–10.5)
nRBC: 0 % (ref 0.0–0.2)

## 2018-08-07 LAB — URINALYSIS, ROUTINE W REFLEX MICROSCOPIC
Bacteria, UA: NONE SEEN
Bilirubin Urine: NEGATIVE
Glucose, UA: >=500 mg/dL — AB
Ketones, ur: NEGATIVE mg/dL
Leukocytes,Ua: NEGATIVE
Nitrite: NEGATIVE
Protein, ur: 30 mg/dL — AB
Specific Gravity, Urine: 1.038 — ABNORMAL HIGH (ref 1.005–1.030)
pH: 5 (ref 5.0–8.0)

## 2018-08-07 LAB — HEPATIC FUNCTION PANEL
ALT: 67 U/L — ABNORMAL HIGH (ref 0–44)
AST: 51 U/L — ABNORMAL HIGH (ref 15–41)
Albumin: 3.9 g/dL (ref 3.5–5.0)
Alkaline Phosphatase: 84 U/L (ref 38–126)
Bilirubin, Direct: 0.3 mg/dL — ABNORMAL HIGH (ref 0.0–0.2)
Indirect Bilirubin: 0.4 mg/dL (ref 0.3–0.9)
Total Bilirubin: 0.7 mg/dL (ref 0.3–1.2)
Total Protein: 6.8 g/dL (ref 6.5–8.1)

## 2018-08-07 LAB — LIPASE, BLOOD: Lipase: 38 U/L (ref 11–51)

## 2018-08-07 MED ORDER — IOHEXOL 300 MG/ML  SOLN
100.0000 mL | Freq: Once | INTRAMUSCULAR | Status: AC | PRN
  Administered 2018-08-07: 100 mL via INTRAVENOUS

## 2018-08-07 MED ORDER — KETOROLAC TROMETHAMINE 30 MG/ML IJ SOLN
30.0000 mg | Freq: Once | INTRAMUSCULAR | Status: AC
  Administered 2018-08-07: 30 mg via INTRAVENOUS
  Filled 2018-08-07: qty 1

## 2018-08-07 MED ORDER — SODIUM CHLORIDE 0.9 % IV BOLUS
1000.0000 mL | Freq: Once | INTRAVENOUS | Status: AC
  Administered 2018-08-07: 1000 mL via INTRAVENOUS

## 2018-08-07 NOTE — ED Triage Notes (Addendum)
Reports falling Saturday and landed on the right flank area.  Reports blood in urine and severe pain in "kidney" when he urinates.   As triage progressed admitted he fell about 7 feet off a ladder landing on the right flank.  Reports pain radiates up into the upper back and shoulder.  Also reports taking 4 total extra strength tylenol yesterday.  2 in the morning and 2 in the evening.  Per wife eyes were yellow yesterday.  Hx of liver disease.

## 2018-08-07 NOTE — ED Notes (Signed)
Patient transported to CT 

## 2018-08-07 NOTE — ED Provider Notes (Addendum)
[facility] Provider Note   CSN: [id number] Arrival date & time: 08/07/18  1751     History   Chief Complaint Chief Complaint  Patient presents with  . Flank Pain    HPI Michael Carroll is a 58 y.o. male.     The history is provided by the patient.  Patient is a 58 year old male with a past medical history of diabetes, hypertension, recurrent kidney stones and obesity who presents to the ED today for evaluation of 2 days of right flank, right lateral back and right upper quadrant abdomen pain.  He reports that he apparently fell 2 days ago, about 6 to 7 feet backwards off of a ladder and landed directly on his back.  Since that time he says he has noticed bloody urine and pain when he urinates.  He states the pain is slightly different from his "normal pain" that he gets with kidney stones.  Note that on review of the EMR he is seen here in the ED probably every other month for renal colic.  He denies any fever or chills.  He does report nausea.  He does not currently have a primary care physician.   Past Medical History:  Diagnosis Date  . At risk for sleep apnea    STOP-BANG=  4     . Cirrhosis of liver without mention of alcohol    PER LIVER BX 03/2012  . Headache(784.0)    migraines  . History of kidney stones   . History of migraine   . Hypertension    NO MEDS  . Kidney stone   . Productive cough   . Right ureteral stone   . Smokers' cough (HCC)   . Type 2 diabetes, diet controlled [facility](HCC)     Patient Active Problem List   Diagnosis Date Noted  . Chronic cholecystitis with calculus 03/26/2012    Past Surgical History:  Procedure Laterality Date  . CARDIOVASCULAR STRESS TEST  2004   CARDIOLITE STUDY NORMAL/  EF 60%  . CHOLECYSTECTOMY N/A 03/26/2012   Procedure: LAPAROSCOPIC CHOLECYSTECTOMY;  Surgeon: Romie LeveeAlicia Thomas, MD;  Location: [facility];  Service: General;  Laterality: N/A;  . CYSTO/   RIGHT URETERAL STENT EXCHANGE   12/2007  . CYSTO/  RIGHT URETERAL STENT PLACEMENT  06/2006  . CYSTOSCOPY  07/11/2013   Procedure: CYSTOSCOPY RIGHT STENT REMOVAL;  Surgeon: Crist FatBenjamin W Herrick, MD;  Location: [facility];  Service: Urology;;  . Bluford KaufmannYSTOSCOPY WITH RETROGRADE PYELOGRAM, URETEROSCOPY AND STENT PLACEMENT Right 05/15/2013   Procedure: CYSTOSCOPY WITH RETROGRADE PYELOGRAM, URETEROSCOPY AND STENT PLACEMENT;  Surgeon: Crist FatBenjamin W Herrick, MD;  Location: [facility];  Service: Urology;  Laterality: Right;  . CYSTOSCOPY WITH RETROGRADE PYELOGRAM, URETEROSCOPY AND STENT PLACEMENT Right 05/30/2013   Procedure: CYSTOSCOPY WITH RIGHT RETROGRADE PYELOGRAM, URETEROSCOPY LASER LITHOTRIPSY AND STENT EXCHANGE ;  Surgeon: Crist FatBenjamin W Herrick, MD;  [facility] [place];  Service: Urology;  Laterality: Right;  . EXTRACORPOREAL SHOCK WAVE LITHOTRIPSY    . HOLMIUM LASER APPLICATION Right 05/30/2013   Procedure: HOLMIUM LASER APPLICATION;  Surgeon: Crist FatBenjamin W Herrick, MD;  [facility] [place];  Service: Urology;  Laterality: Right;  . LIVER BIOPSY N/A 03/26/2012   Procedure: LIVER BIOPSY;  Surgeon: Romie LeveeAlicia Thomas, MD;  Location: [facility];  Service: General;  Laterality: N/A;        Home Medications    Prior to Admission medications   Medication Sig Start Date End Date Taking? Authorizing Provider  acetaminophen (  TYLENOL) 325 MG tablet Take 2 tablets (650 mg total) by mouth every 6 (six) hours as needed. 12/04/17   Arby BarrettePfeiffer, Marcy, MD  amLODipine (NORVASC) 10 MG tablet Take 1 tablet (10 mg total) by mouth daily. 12/04/17   Arby BarrettePfeiffer, Marcy, MD  amLODipine (NORVASC) 5 MG tablet Take 1 tablet (5 mg total) by mouth daily. 03/30/17   Benjiman CorePickering, Nathan, MD  aspirin EC 81 MG tablet Take 81 mg by mouth daily.    [provider]  ibuprofen (ADVIL,MOTRIN) 600 MG tablet Take 1 tablet (600 mg total) by mouth every 6 (six) hours as needed. 05/26/17   Nira Connardama, Pedro Eduardo, MD  metFORMIN (GLUCOPHAGE) 500 MG tablet Take 1 tablet  (500 mg total) by mouth 2 (two) times daily with a meal. 03/30/17   Benjiman CorePickering, Nathan, MD  metFORMIN (GLUCOPHAGE) 500 MG tablet Take 1 tablet (500 mg total) by mouth 2 (two) times daily with a meal. 12/04/17   Arby BarrettePfeiffer, Marcy, MD  methocarbamol (ROBAXIN) 500 MG tablet Take 1 tablet (500 mg total) by mouth every 6 (six) hours as needed for muscle spasms. 12/04/17   Arby BarrettePfeiffer, Marcy, MD  metoCLOPramide (REGLAN) 10 MG tablet Take 1 tablet (10 mg total) by mouth every 8 (eight) hours as needed (headache). 10/15/15   Tomasita Crumbleni, Adeleke, MD  oxyCODONE-acetaminophen (PERCOCET/ROXICET) 5-325 MG tablet Take 1-2 tablets by mouth every 6 (six) hours as needed for severe pain. 08/02/16   Cathren LaineSteinl, Kevin, MD  oxyCODONE-acetaminophen (PERCOCET/ROXICET) 5-325 MG tablet Take 1 tablet by mouth every 6 (six) hours as needed for severe pain. 03/07/17   Mackuen, Cindee Saltourteney Lyn, MD  traMADol Janean Sark(ULTRAM) 50 MG tablet 1-2 tablets every 6 hours as needed for pain 12/04/17   Arby BarrettePfeiffer, Marcy, MD    Family History Family History  Problem Relation Age of Onset  . Cancer - Lung Mother   . Cancer Mother   . Seizures Sister   . Heart disease Father     Social History Social History   Tobacco Use  . Smoking status: Current Every Day Smoker    Packs/day: 1.50    Years: 25.00    Pack years: 37.50    Types: Cigarettes  . Smokeless tobacco: Never Used  Substance Use Topics  . Alcohol use: No  . Drug use: No     Allergies   Zofran [ondansetron hcl] and Compazine   Review of Systems Review of Systems  Constitutional: Negative for chills and fever.  HENT: Negative for ear pain and sore throat.   Eyes: Negative for pain and visual disturbance.  Respiratory: Negative for cough and shortness of breath.   Cardiovascular: Negative for chest pain and palpitations.  Gastrointestinal: Positive for abdominal pain, diarrhea (1 day) and nausea. Negative for vomiting.  Genitourinary: Negative for dysuria and hematuria.  Musculoskeletal:  Negative for arthralgias and back pain.  Skin: Negative for color change and rash.  Neurological: Negative for seizures and syncope.  All other systems reviewed and are negative.    Physical Exam Updated Vital Signs BP (!) 158/95   Pulse 62   Temp 98.3 F (36.8 C) (Oral)   Resp 16   Ht 6' (1.829 m)   Wt 108.4 kg   SpO2 97%   BMI 32.41 kg/m   Physical Exam Vitals signs and nursing note reviewed.  Constitutional:      Appearance: He is well-developed.  HENT:     Head: Normocephalic and atraumatic.  Eyes:     Conjunctiva/sclera: Conjunctivae normal.  Neck:  Musculoskeletal: Neck supple.  Cardiovascular:     Rate and Rhythm: Normal rate and regular rhythm.     Heart sounds: No murmur.  Pulmonary:     Effort: Pulmonary effort is normal. No respiratory distress.     Breath sounds: Normal breath sounds.  Abdominal:     General: Bowel sounds are normal.     Palpations: Abdomen is soft.     Tenderness: There is abdominal tenderness (right flank) in the right upper quadrant, right lower quadrant and left upper quadrant.  Skin:    General: Skin is warm and dry.     Capillary Refill: Capillary refill takes less than 2 seconds.  Neurological:     General: No focal deficit present.     Mental Status: He is alert.      ED Treatments / Results  Labs (all labs ordered are listed, but only abnormal results are displayed) Labs Reviewed  CBC WITH DIFFERENTIAL/PLATELET - Abnormal; Notable for the following components:      Result Value   RBC 6.33 (*)    Hemoglobin 18.5 (*)    HCT 53.2 (*)    Platelets 120 (*)    All other components within normal limits  BASIC METABOLIC PANEL - Abnormal; Notable for the following components:   Glucose, Bld 304 (*)    All other components within normal limits  URINALYSIS, ROUTINE W REFLEX MICROSCOPIC    EKG None  Radiology No results found.  Procedures Procedures (including critical care time)  Medications Ordered in ED  Medications - No data to display   Initial Impression / Assessment and Plan / ED Course  I have reviewed the triage vital signs and the nursing notes.  Pertinent labs & imaging results that were available during my care of the patient were reviewed by me and considered in my medical decision making (see chart for details).  Of note, this patient was evaluated in the Emergency Department for the symptoms described in the history of present illness. He was evaluated in the context of the global COVID-19 pandemic, which necessitated consideration that the patient might be at risk for infection with the SARS-CoV-2 virus that causes COVID-19. Institutional protocols and algorithms that pertain to the evaluation of patients at risk for COVID-19 are in a state of rapid change based on information released by regulatory bodies including the CDC and federal and state organizations. These policies and algorithms were followed during the patient's care in the ED.  During this patient encounter, the patient was wearing a mask, and throughout this encounter I was wearing at least a surgical mask.  I was not within 6 feet of this patient for more than 15 minutes without eye protection when they were not wearing mask.   Differentials considered: Ureteric lithiasis, nephrolithiasis, renal contusion, renal laceration, ureteral injury, bladder injury  EM Physician interpretation of Labs & Imaging: . CBC is grossly unremarkable aside from polycythemia with Hgb 18.5 and HCT 53.2, however this is likely hemoconcentrated due to dehydration . Urinalysis with significantly concentrated urine with a spec gravity of 1.038, glucosuria and proteinuria is present . Metabolic panel does show hyperglycemia with glucose of 304 . Hepatic function panel with mildly elevated transaminases. . Normal lipase  Medical Decision Making:  Michael Carroll is a 59 y.o. male with past medical history of recurrent kidney stones requiring  prior stenting who fell backwards off of a ladder 2 days ago and since that time has had renal colic-like pain dysuria and hematuria.  He is not had fever or chills.  His laboratory and imaging work-up here in the emergency department are reassuring.  He does not have an infection at this time he does not have an infected stone at this time.  His CT imaging of his abdomen and pelvis [place] line of blood/blood-like products within the kidney parenchyma that is likely radiographic evidence of our clinical suspicion for kidney contusion.  There is a small (3 mm) stone within the upper pole of the right kidney, but no obstructing stone or stone seen within the right ureter itself.  Explains information to him and talked at length with him about the importance of getting a primary care physician as he has significantly uncontrolled diabetes.  Area resource information was given to him for that.  He was advised to return to the ED as needed with the development of any new, worsening or concerning symptoms.  The plan for this patient was discussed with my attending physician, Dr. Gwyneth SproutWhitney Plunkett, who voiced agreement and who oversaw evaluation and treatment of this patient.   CLINICAL IMPRESSION: 1. Renal calculus, right   2. Renal contusion, right, initial encounter   3. Uncontrolled other specified diabetes mellitus with hyperglycemia [facility](HCC)      Disposition: Discharge  Key discharge instructions: Strict return precautions provided. Patient was encouraged to return to the ED should they experience worsening or persistence of current symptoms, or should they develop new concerning symptoms. Encouraged them to f/u with their PCP on an outpatient basis. Questions regarding the diagnosis were answered, and side effects regarding therapies were provided in writing or orally. Patient discharged in stable condition.   Challen Spainhour A. Mayford KnifeWilliams, MD Resident Physician, PGY-3 Emergency Medicine [facility] [employer]    Saverio DankerWilliams, Emalina Dubreuil A, MD 08/08/18 1312    Gwyneth SproutPlunkett, Whitney, MD 08/08/18 2358

## 2018-08-08 MED ORDER — CYCLOBENZAPRINE HCL 10 MG PO TABS: 10.0000 mg | ORAL_TABLET | Freq: Two times a day (BID) | ORAL | 0 refills | Status: DC | PRN

## 2018-08-08 MED ORDER — HYDROCODONE-ACETAMINOPHEN 5-325 MG PO TABS
2.0000 | ORAL_TABLET | Freq: Once | ORAL | Status: AC
  Administered 2018-08-08: 2 via ORAL
  Filled 2018-08-08: qty 2

## 2018-08-08 MED ORDER — HYDROCODONE-ACETAMINOPHEN 5-325 MG PO TABS: 1.0000 | ORAL_TABLET | ORAL | 0 refills | Status: DC | PRN

## 2018-08-08 NOTE — Discharge Instructions (Signed)
As discussed in the emergency department today, there was one small, nonobstructing stone that is within the upper pole of the right kidney.  There is no stone seen within the ureter.  Stay well-hydrated, follow-up with a primary care physician, there is some information above for assistance in obtaining a primary care physician if you do not already have one.  Additionally, as discussed in the emergency department today, you have uncontrolled diabetes with hyperglycemia, this can worsen your underlying kidney disease.

## 2018-09-12 ENCOUNTER — Emergency Department (HOSPITAL_BASED_OUTPATIENT_CLINIC_OR_DEPARTMENT_OTHER): Payer: Self-pay

## 2018-09-12 ENCOUNTER — Encounter (HOSPITAL_BASED_OUTPATIENT_CLINIC_OR_DEPARTMENT_OTHER): Payer: Self-pay | Admitting: *Deleted

## 2018-09-12 ENCOUNTER — Other Ambulatory Visit: Payer: Self-pay

## 2018-09-12 ENCOUNTER — Emergency Department (HOSPITAL_BASED_OUTPATIENT_CLINIC_OR_DEPARTMENT_OTHER)
Admission: EM | Admit: 2018-09-12 | Discharge: 2018-09-13 | Disposition: A | Discharge status: Home / Self Care | Payer: Self-pay | Attending: Emergency Medicine | Admitting: Emergency Medicine

## 2018-09-12 DIAGNOSIS — E119 Type 2 diabetes mellitus without complications: Secondary | ICD-10-CM | POA: Insufficient documentation

## 2018-09-12 DIAGNOSIS — R109 Unspecified abdominal pain: Secondary | ICD-10-CM | POA: Insufficient documentation

## 2018-09-12 DIAGNOSIS — Z79899 Other long term (current) drug therapy: Secondary | ICD-10-CM | POA: Insufficient documentation

## 2018-09-12 DIAGNOSIS — I10 Essential (primary) hypertension: Secondary | ICD-10-CM | POA: Insufficient documentation

## 2018-09-12 LAB — CBC
HCT: 52.1 % — ABNORMAL HIGH (ref 39.0–52.0)
Hemoglobin: 17.9 g/dL — ABNORMAL HIGH (ref 13.0–17.0)
MCH: 29.2 pg (ref 26.0–34.0)
MCHC: 34.4 g/dL (ref 30.0–36.0)
MCV: 84.9 fL (ref 80.0–100.0)
Platelets: 127 10*3/uL — ABNORMAL LOW (ref 150–400)
RBC: 6.14 MIL/uL — ABNORMAL HIGH (ref 4.22–5.81)
RDW: 12.2 % (ref 11.5–15.5)
WBC: 8.6 10*3/uL (ref 4.0–10.5)
nRBC: 0 % (ref 0.0–0.2)

## 2018-09-12 LAB — BASIC METABOLIC PANEL
Anion gap: 11 (ref 5–15)
BUN: 16 mg/dL (ref 6–20)
CO2: 28 mmol/L (ref 22–32)
Calcium: 10.2 mg/dL (ref 8.9–10.3)
Chloride: 95 mmol/L — ABNORMAL LOW (ref 98–111)
Creatinine, Ser: 0.88 mg/dL (ref 0.61–1.24)
GFR calc Af Amer: >60 mL/min (ref >60)
GFR calc non Af Amer: >60 mL/min (ref >60)
Glucose, Bld: 344 mg/dL — ABNORMAL HIGH (ref 70–99)
Potassium: 4.5 mmol/L (ref 3.5–5.1)
Sodium: 134 mmol/L — ABNORMAL LOW (ref 135–145)

## 2018-09-12 LAB — URINALYSIS, MICROSCOPIC (REFLEX)
RBC / HPF: NONE SEEN RBC/hpf (ref 0–5)
WBC, UA: NONE SEEN WBC/hpf (ref 0–5)

## 2018-09-12 LAB — URINALYSIS, ROUTINE W REFLEX MICROSCOPIC
Bilirubin Urine: NEGATIVE
Glucose, UA: >=500 mg/dL — AB
Hgb urine dipstick: NEGATIVE
Ketones, ur: NEGATIVE mg/dL
Leukocytes,Ua: NEGATIVE
Nitrite: NEGATIVE
Protein, ur: NEGATIVE mg/dL
Specific Gravity, Urine: 1.02 (ref 1.005–1.030)
pH: 7 (ref 5.0–8.0)

## 2018-09-12 LAB — CBG MONITORING, ED: Glucose-Capillary: 333 mg/dL — ABNORMAL HIGH (ref 70–99)

## 2018-09-12 MED ORDER — CEPHALEXIN 500 MG PO CAPS: ORAL_CAPSULE | ORAL | 0 refills | Status: DC

## 2018-09-12 MED ORDER — AMLODIPINE BESYLATE 5 MG PO TABS
5.0000 mg | ORAL_TABLET | Freq: Once | ORAL | Status: AC
  Administered 2018-09-12: 5 mg via ORAL
  Filled 2018-09-12: qty 1

## 2018-09-12 MED ORDER — KETOROLAC TROMETHAMINE 30 MG/ML IJ SOLN
15.0000 mg | Freq: Once | INTRAMUSCULAR | Status: AC
  Administered 2018-09-12: 15 mg via INTRAVENOUS
  Filled 2018-09-12: qty 1

## 2018-09-12 MED ORDER — CEPHALEXIN 250 MG PO CAPS
500.0000 mg | ORAL_CAPSULE | Freq: Once | ORAL | Status: AC
  Administered 2018-09-13: 500 mg via ORAL
  Filled 2018-09-12: qty 2

## 2018-09-12 MED ORDER — DICLOFENAC SODIUM ER 100 MG PO TB24: 100.0000 mg | ORAL_TABLET | Freq: Every day | ORAL | 0 refills | Status: DC

## 2018-09-12 NOTE — ED Notes (Signed)
UC collected and sent to lab.

## 2018-09-12 NOTE — ED Provider Notes (Signed)
[facility] EMERGENCY DEPARTMENT Provider Note   CSN: [id number] Arrival date & time: 09/12/18  2119     History   Chief Complaint Chief Complaint  Patient presents with  . Flank Pain    HPI Michael Carroll is a 58 y.o. male.     The history is provided by the patient.  Flank Pain This is a chronic problem. The current episode started more than 1 week ago (several months ago). The problem has been rapidly worsening. Pertinent negatives include no chest pain, no abdominal pain, no headaches and no shortness of breath. Nothing aggravates the symptoms. Nothing relieves the symptoms. He has tried nothing for the symptoms. The treatment provided no relief.  States he does not feel he passed his kidney stone several months ago as he has ongoing Right flank pain and nausea.  No dysuria.  No hematuria.  Did not follow up with urology.  No trauma.  No f/c/r.    Past Medical History:  Diagnosis Date  . At risk for sleep apnea    STOP-BANG=  4     . Cirrhosis of liver without mention of alcohol    PER LIVER BX 03/2012  . Headache(784.0)    migraines  . History of kidney stones   . History of migraine   . Hypertension    NO MEDS  . Kidney stone   . Productive cough   . Right ureteral stone   . Smokers' cough (HCC)   . Type 2 diabetes, diet controlled [facility](HCC)     Patient Active Problem List   Diagnosis Date Noted  . Chronic cholecystitis with calculus 03/26/2012    Past Surgical History:  Procedure Laterality Date  . CARDIOVASCULAR STRESS TEST  2004   CARDIOLITE STUDY NORMAL/  EF 60%  . CHOLECYSTECTOMY N/A 03/26/2012   Procedure: LAPAROSCOPIC CHOLECYSTECTOMY;  Surgeon: Romie LeveeAlicia Thomas, MD;  Location: [facility];  Service: General;  Laterality: N/A;  . CYSTO/   RIGHT URETERAL STENT EXCHANGE  12/2007  . CYSTO/  RIGHT URETERAL STENT PLACEMENT  06/2006  . CYSTOSCOPY  07/11/2013   Procedure: CYSTOSCOPY RIGHT STENT REMOVAL;  Surgeon: Crist FatBenjamin W Herrick, MD;  Location: [facility];   Service: Urology;;  . Bluford KaufmannYSTOSCOPY WITH RETROGRADE PYELOGRAM, URETEROSCOPY AND STENT PLACEMENT Right 05/15/2013   Procedure: CYSTOSCOPY WITH RETROGRADE PYELOGRAM, URETEROSCOPY AND STENT PLACEMENT;  Surgeon: Crist FatBenjamin W Herrick, MD;  Location: [facility];  Service: Urology;  Laterality: Right;  . CYSTOSCOPY WITH RETROGRADE PYELOGRAM, URETEROSCOPY AND STENT PLACEMENT Right 05/30/2013   Procedure: CYSTOSCOPY WITH RIGHT RETROGRADE PYELOGRAM, URETEROSCOPY LASER LITHOTRIPSY AND STENT EXCHANGE ;  Surgeon: Crist FatBenjamin W Herrick, MD;  [facility] [place];  Service: Urology;  Laterality: Right;  . EXTRACORPOREAL SHOCK WAVE LITHOTRIPSY    . HOLMIUM LASER APPLICATION Right 05/30/2013   Procedure: HOLMIUM LASER APPLICATION;  Surgeon: Crist FatBenjamin W Herrick, MD;  [facility] [place];  Service: Urology;  Laterality: Right;  . LIVER BIOPSY N/A 03/26/2012   Procedure: LIVER BIOPSY;  Surgeon: Romie LeveeAlicia Thomas, MD;  Location: [facility];  Service: General;  Laterality: N/A;        Home Medications    Prior to Admission medications   Medication Sig Start Date End Date Taking? Authorizing Provider  acetaminophen (TYLENOL) 325 MG tablet Take 2 tablets (650 mg total) by mouth every 6 (six) hours as needed. 12/04/17  Yes Arby BarrettePfeiffer, Marcy, MD  aspirin EC 81 MG tablet Take 81 mg by mouth daily.   Yes [provider]  amLODipine (NORVASC) 10 MG tablet Take 1 tablet (10 mg total) by mouth daily. 12/04/17   Arby BarrettePfeiffer, Marcy, MD  amLODipine (NORVASC) 5 MG tablet Take 1 tablet (5 mg total) by mouth daily. 03/30/17   Benjiman CorePickering, Nathan, MD  cyclobenzaprine (FLEXERIL) 10 MG tablet Take 1 tablet (10 mg total) by mouth 2 (two) times daily as needed for muscle spasms. 08/08/18   Gwyneth SproutPlunkett, Whitney, MD  HYDROcodone-acetaminophen (NORCO/VICODIN) 5-325 MG tablet Take 1-2 tablets by mouth every 4 (four) hours as needed. 08/08/18   Gwyneth SproutPlunkett, Whitney, MD  ibuprofen (ADVIL,MOTRIN) 600 MG tablet Take 1 tablet (600 mg total)  by mouth every 6 (six) hours as needed. 05/26/17   Nira Connardama, Pedro Eduardo, MD  metFORMIN (GLUCOPHAGE) 500 MG tablet Take 1 tablet (500 mg total) by mouth 2 (two) times daily with a meal. 03/30/17   Benjiman CorePickering, Nathan, MD  metFORMIN (GLUCOPHAGE) 500 MG tablet Take 1 tablet (500 mg total) by mouth 2 (two) times daily with a meal. 12/04/17   Arby BarrettePfeiffer, Marcy, MD  methocarbamol (ROBAXIN) 500 MG tablet Take 1 tablet (500 mg total) by mouth every 6 (six) hours as needed for muscle spasms. 12/04/17   Arby BarrettePfeiffer, Marcy, MD  metoCLOPramide (REGLAN) 10 MG tablet Take 1 tablet (10 mg total) by mouth every 8 (eight) hours as needed (headache). 10/15/15   Tomasita Crumbleni, Adeleke, MD  oxyCODONE-acetaminophen (PERCOCET/ROXICET) 5-325 MG tablet Take 1-2 tablets by mouth every 6 (six) hours as needed for severe pain. 08/02/16   Cathren LaineSteinl, Kevin, MD  oxyCODONE-acetaminophen (PERCOCET/ROXICET) 5-325 MG tablet Take 1 tablet by mouth every 6 (six) hours as needed for severe pain. 03/07/17   Mackuen, Cindee Saltourteney Lyn, MD  traMADol Janean Sark(ULTRAM) 50 MG tablet 1-2 tablets every 6 hours as needed for pain 12/04/17   Arby BarrettePfeiffer, Marcy, MD    Family History Family History  Problem Relation Age of Onset  . Cancer - Lung Mother   . Cancer Mother   . Seizures Sister   . Heart disease Father     Social History Social History   Tobacco Use  . Smoking status: Current Every Day Smoker    Packs/day: 1.50    Years: 25.00    Pack years: 37.50    Types: Cigarettes  . Smokeless tobacco: Never Used  Substance Use Topics  . Alcohol use: No  . Drug use: No     Allergies   Zofran [ondansetron hcl] and Compazine   Review of Systems Review of Systems  Constitutional: Negative for fever.  HENT: Negative for congestion.   Eyes: Negative for visual disturbance.  Respiratory: Negative for shortness of breath.   Cardiovascular: Negative for chest pain.  Gastrointestinal: Positive for nausea. Negative for abdominal pain and vomiting.  Genitourinary:  Positive for flank pain. Negative for dysuria, hematuria and urgency.  Musculoskeletal: Negative for arthralgias.  Neurological: Negative for headaches.  Psychiatric/Behavioral: Negative for agitation.  All other systems reviewed and are negative.    Physical Exam Updated Vital Signs BP (!) 188/112   Pulse 86   Temp 98.5 F (36.9 C) (Oral)   Resp 18   Ht 6' (1.829 m)   Wt 107.5 kg   SpO2 99%   BMI 32.14 kg/m   Physical Exam Vitals signs and nursing note reviewed.  Constitutional:      Appearance: He is obese. He is not ill-appearing.  HENT:     Head: Normocephalic and atraumatic.     Nose: Nose normal.  Eyes:     Conjunctiva/sclera: Conjunctivae normal.  Pupils: Pupils are equal, round, and reactive to light.  Neck:     Musculoskeletal: Normal range of motion and neck supple.  Cardiovascular:     Rate and Rhythm: Normal rate and regular rhythm.     Pulses: Normal pulses.     Heart sounds: Normal heart sounds.  Pulmonary:     Effort: Pulmonary effort is normal.     Breath sounds: Normal breath sounds.  Abdominal:     General: Abdomen is flat. Bowel sounds are normal.     Tenderness: There is no abdominal tenderness. There is no guarding or rebound.  Musculoskeletal: Normal range of motion.  Skin:    General: Skin is warm and dry.     Capillary Refill: Capillary refill takes less than 2 seconds.  Neurological:     General: No focal deficit present.     Mental Status: He is alert and oriented to person, place, and time.  Psychiatric:        Mood and Affect: Mood normal.        Behavior: Behavior normal.      ED Treatments / Results  Labs (all labs ordered are listed, but only abnormal results are displayed) Results for orders placed or performed during the hospital encounter of 09/12/18  Urinalysis, Routine w reflex microscopic  Result Value Ref Range   Color, Urine YELLOW YELLOW   APPearance CLEAR CLEAR   Specific Gravity, Urine 1.020 1.005 - 1.030    pH 7.0 5.0 - 8.0   Glucose, UA >=500 (A) NEGATIVE mg/dL   Hgb urine dipstick NEGATIVE NEGATIVE   Bilirubin Urine NEGATIVE NEGATIVE   Ketones, ur NEGATIVE NEGATIVE mg/dL   Protein, ur NEGATIVE NEGATIVE mg/dL   Nitrite NEGATIVE NEGATIVE   Leukocytes,Ua NEGATIVE NEGATIVE  Urinalysis, Microscopic (reflex)  Result Value Ref Range   RBC / HPF NONE SEEN 0 - 5 RBC/hpf   WBC, UA NONE SEEN 0 - 5 WBC/hpf   Bacteria, UA RARE (A) NONE SEEN   Squamous Epithelial / LPF 0-5 0 - 5  Basic metabolic panel  Result Value Ref Range   Sodium 134 (L) 135 - 145 mmol/L   Potassium 4.5 3.5 - 5.1 mmol/L   Chloride 95 (L) 98 - 111 mmol/L   CO2 28 22 - 32 mmol/L   Glucose, Bld 344 (H) 70 - 99 mg/dL   BUN 16 6 - 20 mg/dL   Creatinine, Ser 2.22 0.61 - 1.24 mg/dL   Calcium 97.9 8.9 - 89.2 mg/dL   GFR calc non Af Amer >60 >60 mL/min   GFR calc Af Amer >60 >60 mL/min   Anion gap 11 5 - 15  CBC  Result Value Ref Range   WBC 8.6 4.0 - 10.5 K/uL   RBC 6.14 (H) 4.22 - 5.81 MIL/uL   Hemoglobin 17.9 (H) 13.0 - 17.0 g/dL   HCT 11.9 (H) 41.7 - 40.8 %   MCV 84.9 80.0 - 100.0 fL   MCH 29.2 26.0 - 34.0 pg   MCHC 34.4 30.0 - 36.0 g/dL   RDW 14.4 81.8 - 56.3 %   Platelets 127 (L) 150 - 400 K/uL   nRBC 0.0 0.0 - 0.2 %  CBG monitoring, ED  Result Value Ref Range   Glucose-Capillary 333 (H) 70 - 99 mg/dL   Ct Renal Stone Study  Result Date: 09/12/2018 CLINICAL DATA:  Right flank pain EXAM: CT ABDOMEN AND PELVIS WITHOUT CONTRAST TECHNIQUE: Multidetector CT imaging of the abdomen and pelvis was performed following the  standard protocol without IV contrast. COMPARISON:  CT 08/07/2018, 05/28/2018 FINDINGS: Lower chest: Lung bases demonstrate no acute consolidation or effusion. The heart size is normal. Hepatobiliary: Contour nodularity of the liver, suspect for cirrhosis. Status post cholecystectomy. No biliary dilatation Pancreas: Unremarkable. No pancreatic ductal dilatation or surrounding inflammatory changes. Spleen:  Normal in size without focal abnormality. Adrenals/Urinary Tract: Adrenal glands are normal. 3 mm stone upper pole right kidney. Stable mild right renal pelvis dilatation without ureteral stone. Punctate stone lower pole right kidney. The urinary bladder is unremarkable. Stomach/Bowel: Stomach is within normal limits. Appendix appears normal. No evidence of bowel wall thickening, distention, or inflammatory changes. Vascular/Lymphatic: Moderate aortic atherosclerosis. No aneurysmal dilatation. No significantly enlarged lymph nodes Reproductive: Prostate is unremarkable. Other: Negative for free air or free fluid. Musculoskeletal: No acute or significant osseous findings. IMPRESSION: 1. Intrarenal stones within the right kidney. Stable slight prominence of right renal collecting system without evidence for ureteral stone. 2. Contour nodularity of the liver suspect for cirrhosis Electronically Signed   By: Jasmine Pang M.D.   On: 09/12/2018 23:33    Radiology Ct Renal Stone Study  Result Date: 09/12/2018 CLINICAL DATA:  Right flank pain EXAM: CT ABDOMEN AND PELVIS WITHOUT CONTRAST TECHNIQUE: Multidetector CT imaging of the abdomen and pelvis was performed following the standard protocol without IV contrast. COMPARISON:  CT 08/07/2018, 05/28/2018 FINDINGS: Lower chest: Lung bases demonstrate no acute consolidation or effusion. The heart size is normal. Hepatobiliary: Contour nodularity of the liver, suspect for cirrhosis. Status post cholecystectomy. No biliary dilatation Pancreas: Unremarkable. No pancreatic ductal dilatation or surrounding inflammatory changes. Spleen: Normal in size without focal abnormality. Adrenals/Urinary Tract: Adrenal glands are normal. 3 mm stone upper pole right kidney. Stable mild right renal pelvis dilatation without ureteral stone. Punctate stone lower pole right kidney. The urinary bladder is unremarkable. Stomach/Bowel: Stomach is within normal limits. Appendix appears normal. No  evidence of bowel wall thickening, distention, or inflammatory changes. Vascular/Lymphatic: Moderate aortic atherosclerosis. No aneurysmal dilatation. No significantly enlarged lymph nodes Reproductive: Prostate is unremarkable. Other: Negative for free air or free fluid. Musculoskeletal: No acute or significant osseous findings. IMPRESSION: 1. Intrarenal stones within the right kidney. Stable slight prominence of right renal collecting system without evidence for ureteral stone. 2. Contour nodularity of the liver suspect for cirrhosis Electronically Signed   By: Jasmine Pang M.D.   On: 09/12/2018 23:33    Procedures Procedures (including critical care time)  Medications Ordered in ED Medications  ketorolac (TORADOL) 30 MG/ML injection 15 mg (15 mg Intravenous Given 09/12/18 2326)  amLODipine (NORVASC) tablet 5 mg (5 mg Oral Given 09/12/18 2325)     No stones in the ureters.  Will treat for UTI as there is bacteria and have sent urine for culture.  I do not believe the few bacteria is causing the pain.  Likely MSK in nature.  Instructed to follow up with PMD.   Michael Carroll was evaluated in Emergency Department on 09/13/2018 for the symptoms described in the history of present illness. He was evaluated in the context of the global COVID-19 pandemic, which necessitated consideration that the patient might be at risk for infection with the SARS-CoV-2 virus that causes COVID-19. Institutional protocols and algorithms that pertain to the evaluation of patients at risk for COVID-19 are in a state of rapid change based on information released by regulatory bodies including the CDC and federal and state organizations. These policies and algorithms were followed during the patient's care in the  ED.  Final Clinical Impressions(s) / ED Diagnoses   Return for intractable cough, coughing up blood,fevers >100.4 unrelieved by medication, shortness of breath, intractable vomiting, chest pain, shortness of  breath, weakness,numbness, changes in speech, facial asymmetry,abdominal pain, passing out,Inability to tolerate liquids or food, cough, altered mental status or any concerns. No signs of systemic illness or infection. The patient is nontoxic-appearing on exam and vital signs are within normal limits.   I have reviewed the triage vital signs and the nursing notes. Pertinent labs &imaging results that were available during my care of the patient were reviewed by me and considered in my medical decision making (see chart for details).  After history, exam, and medical workup I feel the patient has been appropriately medically screened and is safe for discharge home. Pertinent diagnoses were discussed with the patient. Patient was given return precautions   Tehila Sokolow, MD 09/13/18 8811

## 2018-09-12 NOTE — ED Triage Notes (Signed)
Right flank pain x 4 weeks. He was seen at [facility] at the time of onset of pain and CT showed a kidney stone. States he passes kidney stones monthly.

## 2018-09-12 NOTE — ED Notes (Signed)
Patient transported to CT 

## 2018-09-13 ENCOUNTER — Encounter (HOSPITAL_BASED_OUTPATIENT_CLINIC_OR_DEPARTMENT_OTHER): Payer: Self-pay | Admitting: Emergency Medicine

## 2018-09-13 MED ORDER — PROMETHAZINE HCL 25 MG/ML IJ SOLN
12.5000 mg | Freq: Once | INTRAMUSCULAR | Status: AC
  Administered 2018-09-13: 12.5 mg via INTRAVENOUS

## 2018-09-13 MED ORDER — PROMETHAZINE HCL 25 MG/ML IJ SOLN
INTRAMUSCULAR | Status: AC
  Filled 2018-09-13: qty 1

## 2018-09-14 LAB — URINE CULTURE: Culture: <10000 — AB

## 2018-09-15 NOTE — Progress Notes (Signed)
Patient ID: Michael Carroll, male   DOB: 1961-01-01, 58 y.o.   MRN: [id number]    Michael Carroll, is a 58 y.o. male  IYM:[id number]  HWK:[id number]  DOB - 1960-04-14  Subjective:  Chief Complaint and HPI: Michael Carroll is a 58 y.o. male here today to establish care and for a follow up visit After ED visit 09/12/2018 for flank pain.  He has untreated diabetes and says he has been out of meds X2 months.  Stopped taking metformin due to GI distress.  No job so hasn't had money to get meds.  He does not have a glucometer.  Does not want to give metformin another try.    He says he has "chronic kidney stones."  Results from CT 09/12/2018: IMPRESSION: 1. Intrarenal stones within the right kidney. Stable slight prominence of right renal collecting system without evidence for ureteral stone. 2. Contour nodularity of the liver suspect for cirrhosis  From ED note: Flank Pain This is a chronic problem. The current episode started more than 1 week ago (several months ago). The problem has been rapidly worsening. Pertinent negatives include no chest pain, no abdominal pain, no headaches and no shortness of breath. Nothing aggravates the symptoms. Nothing relieves the symptoms. He has tried nothing for the symptoms. The treatment provided no relief.  States he does not feel he passed his kidney stone several months ago as he has ongoing Right flank pain and nausea.  No dysuria.  No hematuria.  Did not follow up with urology.  No trauma.  No f/c/r.   No stones in the ureters.  Will treat for UTI as there is bacteria and have sent urine for culture.  I do not believe the few bacteria is causing the pain.  Likely MSK in nature.  Instructed to follow up with PMD.   HELIX LAFONTAINE was evaluated in Emergency Department on 09/13/2018 for the symptoms described in the history of present illness. He was evaluated in the context of the global COVID-19 pandemic, which necessitated consideration that the patient  might be at risk for infection with the SARS-CoV-2 virus that causes COVID-19. Institutional protocols and algorithms that pertain to the evaluation of patients at risk for COVID-19 are in a state of rapid change based on information released by regulatory bodies including the CDC and federal and state organizations. These policies and algorithms  ED/Hospital notes reviewed and summarized above  ROS:   Constitutional:  No f/c, No night sweats, No unexplained weight loss. EENT:  No vision changes, No blurry vision, No hearing changes. No mouth, throat, or ear problems.  Respiratory: No cough, No SOB Cardiac: No CP, no palpitations GI:  No abd pain, No N/V/D. GU: No Urinary s/sx Musculoskeletal: No joint pain Neuro: No headache, no dizziness, no motor weakness.  Skin: No rash Endocrine:  No polydipsia. No polyuria.  Psych: Denies SI/HI  No problems updated.  ALLERGIES: Allergies  Allergen Reactions  . Zofran [Ondansetron Hcl] Other (See Comments)    Causes "splitting headache"  . Compazine Itching and Anxiety    Altered mental status    PAST MEDICAL HISTORY: Past Medical History:  Diagnosis Date  . At risk for sleep apnea    STOP-BANG=  4     . Cirrhosis of liver without mention of alcohol    PER LIVER BX 03/2012  . Headache(784.0)    migraines  . History of kidney stones   . History of migraine   . Hypertension  NO MEDS  . Kidney stone   . Productive cough   . Right ureteral stone   . Smokers' cough ([place])   . Type 2 diabetes, diet controlled ([place])     MEDICATIONS AT HOME: Prior to Admission medications   Medication Sig Start Date End Date Taking? Authorizing Provider  acetaminophen (TYLENOL) 325 MG tablet Take 2 tablets (650 mg total) by mouth every 6 (six) hours as needed. 12/04/17   Charlesetta Shanks, MD  amLODipine (NORVASC) 10 MG tablet Take 1 tablet (10 mg total) by mouth daily. 09/19/18   Argentina Donovan, PA-C  aspirin EC 81 MG tablet Take 81 mg by mouth  daily.    [provider]  Blood Glucose Monitoring Suppl (TRUE METRIX METER) w/Device KIT 1 each by Does not apply route 2 (two) times daily. 09/19/18   Argentina Donovan, PA-C  cyclobenzaprine (FLEXERIL) 10 MG tablet Take 1 tablet (10 mg total) by mouth 2 (two) times daily as needed for muscle spasms. 08/08/18   Blanchie Dessert, MD  Diclofenac Sodium CR 100 MG 24 hr tablet Take 1 tablet (100 mg total) by mouth daily. 09/12/18   Palumbo, April, MD  glipiZIDE (GLUCOTROL) 5 MG tablet Take 1 tablet (5 mg total) by mouth 2 (two) times daily before a meal. 09/19/18   , Dionne Bucy, PA-C  glucose blood (TRUE METRIX BLOOD GLUCOSE TEST) test strip Use as instructed 09/19/18   Argentina Donovan, PA-C  ibuprofen (ADVIL,MOTRIN) 600 MG tablet Take 1 tablet (600 mg total) by mouth every 6 (six) hours as needed. 05/26/17   Fatima Blank, MD  metFORMIN (GLUCOPHAGE) 500 MG tablet Take 1 tablet (500 mg total) by mouth 2 (two) times daily with a meal. 03/30/17   Davonna Belling, MD  methocarbamol (ROBAXIN) 500 MG tablet Take 1 tablet (500 mg total) by mouth every 6 (six) hours as needed for muscle spasms. 12/04/17   Charlesetta Shanks, MD  metoCLOPramide (REGLAN) 10 MG tablet Take 1 tablet (10 mg total) by mouth every 8 (eight) hours as needed (headache). 10/15/15   Everlene Balls, MD  traMADol Veatrice Bourbon) 50 MG tablet 1-2 tablets every 6 hours as needed for pain 12/04/17   Charlesetta Shanks, MD  TRUEplus Lancets 28G MISC 1 each by Does not apply route 2 (two) times daily. 09/19/18   Argentina Donovan, PA-C     Objective:  EXAM:   Vitals:   09/19/18 1410  BP: (!) 174/92  Pulse: 81  Resp: 18  Temp: 98.9 F (37.2 C)  TempSrc: Oral  SpO2: 97%  Weight: 245 lb (111.1 kg)  Height: 6' (1.829 m)    General appearance : A&OX3. NAD. Non-toxic-appearing HEENT: Atraumatic and Normocephalic.  PERRLA. EOM intact.  Neck: supple, no JVD. No cervical lymphadenopathy. No thyromegaly Chest/Lungs:   Breathing-non-labored, Good air entry bilaterally, breath sounds normal without rales, rhonchi, or wheezing  CVS: S1 S2 regular, no murmurs, gallops, rubs  Extremities: Bilateral Lower Ext shows no edema, both legs are warm to touch with = pulse throughout Neurology:  CN II-XII grossly intact, Non focal.   Psych:  TP linear. J/I WNL. Normal speech. Appropriate eye contact and affect.  Skin:  No Rash  Data Review Lab Results  Component Value Date   HGBA1C 10.9 (A) 09/19/2018     Assessment & Plan   1. Type 2 diabetes mellitus with hyperglycemia, unspecified whether long term insulin use (HCC) Uncontrolled-not on meds and doesn't want to try metformin again.  eliminate sugar  from diet.  Try glipizide.  Check labs at f/up - Glucose (CBG) - POCT glycosylated hemoglobin (Hb A1C) - Blood Glucose Monitoring Suppl (TRUE METRIX METER) w/Device KIT; 1 each by Does not apply route 2 (two) times daily.  Dispense: 1 kit; Refill: 1 - glucose blood (TRUE METRIX BLOOD GLUCOSE TEST) test strip; Use as instructed  Dispense: 100 each; Refill: 12 - TRUEplus Lancets 28G MISC; 1 each by Does not apply route 2 (two) times daily.  Dispense: 100 each; Refill: 1 - glipiZIDE (GLUCOTROL) 5 MG tablet; Take 1 tablet (5 mg total) by mouth 2 (two) times daily before a meal.  Dispense: 60 tablet; Refill: 3  2. Flank pain - Ambulatory referral to Urology  3. Encounter for examination following treatment at hospital   4. Hypertension, unspecified type Uncontrolled-not on meds.  Resume meds.   - amLODipine (NORVASC) 10 MG tablet; Take 1 tablet (10 mg total) by mouth daily.  Dispense: 30 tablet; Refill: 1  Patient have been counseled extensively about nutrition and exercise  Return in about 3 weeks (around 10/10/2018) for assign PCP;  f/up htn and DM and bloodwork.  The patient was given clear instructions to go to ER or return to medical center if symptoms don't improve, worsen or new problems develop. The patient  verbalized understanding. The patient was told to call to get lab results if they haven't heard anything in the next week.     Freeman Caldron, PA-C [facility] [place], [place]   09/19/2018, 2:29 PM

## 2018-09-19 ENCOUNTER — Other Ambulatory Visit: Payer: Self-pay

## 2018-09-19 ENCOUNTER — Ambulatory Visit: Payer: Self-pay | Attending: Family Medicine | Admitting: Physician Assistant

## 2018-09-19 ENCOUNTER — Ambulatory Visit (HOSPITAL_BASED_OUTPATIENT_CLINIC_OR_DEPARTMENT_OTHER): Payer: Self-pay | Admitting: Pharmacist

## 2018-09-19 VITALS — BP 174/92 | HR 81 | Temp 98.9°F | Resp 18 | Ht 72.0 in | Wt 245.0 lb

## 2018-09-19 DIAGNOSIS — Z09 Encounter for follow-up examination after completed treatment for conditions other than malignant neoplasm: Secondary | ICD-10-CM

## 2018-09-19 DIAGNOSIS — R109 Unspecified abdominal pain: Secondary | ICD-10-CM

## 2018-09-19 DIAGNOSIS — E1165 Type 2 diabetes mellitus with hyperglycemia: Secondary | ICD-10-CM

## 2018-09-19 DIAGNOSIS — Z23 Encounter for immunization: Secondary | ICD-10-CM

## 2018-09-19 DIAGNOSIS — I10 Essential (primary) hypertension: Secondary | ICD-10-CM | POA: Insufficient documentation

## 2018-09-19 LAB — POCT GLYCOSYLATED HEMOGLOBIN (HGB A1C): Hemoglobin A1C: 10.9 % — AB (ref 4.0–5.6)

## 2018-09-19 LAB — GLUCOSE, POCT (MANUAL RESULT ENTRY): POC Glucose: 301 mg/dl — AB (ref 70–99)

## 2018-09-19 MED ORDER — GLIPIZIDE 5 MG PO TABS: 5.0000 mg | ORAL_TABLET | Freq: Two times a day (BID) | ORAL | 3 refills | Status: DC

## 2018-09-19 MED ORDER — GLYBURIDE 5 MG PO TABS: 5.0000 mg | ORAL_TABLET | Freq: Every day | ORAL | 2 refills | Status: DC

## 2018-09-19 MED ORDER — AMLODIPINE BESYLATE 10 MG PO TABS: 10.0000 mg | ORAL_TABLET | Freq: Every day | ORAL | 1 refills | Status: DC

## 2018-09-19 MED ORDER — TRUE METRIX BLOOD GLUCOSE TEST VI STRP: ORAL_STRIP | 12 refills | Status: AC

## 2018-09-19 MED ORDER — TRUEPLUS LANCETS 28G MISC: 1.0000 | Freq: Two times a day (BID) | 1 refills | Status: AC

## 2018-09-19 MED ORDER — TRUE METRIX METER W/DEVICE KIT: 1.0000 | PACK | Freq: Two times a day (BID) | 1 refills | Status: AC

## 2018-09-19 MED ORDER — AMLODIPINE BESYLATE 10 MG PO TABS: 10.0000 mg | ORAL_TABLET | Freq: Every day | ORAL | 2 refills | Status: AC

## 2018-09-19 MED FILL — ?GLIPIZIDE 5MG TABLET: 5 | 30 days supply | Qty: 60 | Fill #0

## 2018-09-19 MED FILL — TRUE METRIX TEST STRIP: 50 days supply | Qty: 100 | Fill #0

## 2018-09-19 MED FILL — TRUEplus LANCETS 28G MISC: 50 days supply | Qty: 100 | Fill #0

## 2018-09-19 MED FILL — ?AMLODIPINE BESYLATE 10 MG: 10 | 30 days supply | Qty: 30 | Fill #0

## 2018-09-19 MED FILL — !TRUE METRIX BLOOD GLUCOSE: 30 days supply | Qty: 1 | Fill #0

## 2018-09-19 NOTE — Progress Notes (Signed)
Patient presents for vaccination against influenza, tetanus, and strep pneumoniae per orders of Angela. Consent given. Counseling provided. No contraindications exists. Vaccine administered without incident.

## 2018-09-19 NOTE — Patient Instructions (Signed)
Check blood sugars twice daily and record and bring to next visit.  Take glipizide with food.  Drink more water.  Eliminate sugar from your diet   Diabetes Mellitus and Nutrition, Adult When you have diabetes (diabetes mellitus), it is very important to have healthy eating habits because your blood sugar (glucose) levels are greatly affected by what you eat and drink. Eating healthy foods in the appropriate amounts, at about the same times every day, can help you:  Control your blood glucose.  Lower your risk of heart disease.  Improve your blood pressure.  Reach or maintain a healthy weight. Every person with diabetes is different, and each person has different needs for a meal plan. Your health care provider may recommend that you work with a diet and nutrition specialist (dietitian) to make a meal plan that is best for you. Your meal plan may vary depending on factors such as:  The calories you need.  The medicines you take.  Your weight.  Your blood glucose, blood pressure, and cholesterol levels.  Your activity level.  Other health conditions you have, such as heart or kidney disease. How do carbohydrates affect me? Carbohydrates, also called carbs, affect your blood glucose level more than any other type of food. Eating carbs naturally raises the amount of glucose in your blood. Carb counting is a method for keeping track of how many carbs you eat. Counting carbs is important to keep your blood glucose at a healthy level, especially if you use insulin or take certain oral diabetes medicines. It is important to know how many carbs you can safely have in each meal. This is different for every person. Your dietitian can help you calculate how many carbs you should have at each meal and for each snack. Foods that contain carbs include:  Bread, cereal, rice, pasta, and crackers.  Potatoes and corn.  Peas, beans, and lentils.  Milk and yogurt.  Fruit and juice.  Desserts,  such as cakes, cookies, ice cream, and candy. How does alcohol affect me? Alcohol can cause a sudden decrease in blood glucose (hypoglycemia), especially if you use insulin or take certain oral diabetes medicines. Hypoglycemia can be a life-threatening condition. Symptoms of hypoglycemia (sleepiness, dizziness, and confusion) are similar to symptoms of having too much alcohol. If your health care provider says that alcohol is safe for you, follow these guidelines:  Limit alcohol intake to no more than 1 drink per day for nonpregnant women and 2 drinks per day for men. One drink equals 12 oz of beer, 5 oz of wine, or 1 oz of hard liquor.  Do not drink on an empty stomach.  Keep yourself hydrated with water, diet soda, or unsweetened iced tea.  Keep in mind that regular soda, juice, and other mixers may contain a lot of sugar and must be counted as carbs. What are tips for following this plan?  Reading food labels  Start by checking the serving size on the "Nutrition Facts" label of packaged foods and drinks. The amount of calories, carbs, fats, and other nutrients listed on the label is based on one serving of the item. Many items contain more than one serving per package.  Check the total grams (g) of carbs in one serving. You can calculate the number of servings of carbs in one serving by dividing the total carbs by 15. For example, if a food has 30 g of total carbs, it would be equal to 2 servings of carbs.  Check  the number of grams (g) of saturated and trans fats in one serving. Choose foods that have low or no amount of these fats.  Check the number of milligrams (mg) of salt (sodium) in one serving. Most people should limit total sodium intake to less than 2,300 mg per day.  Always check the nutrition information of foods labeled as "low-fat" or "nonfat". These foods may be higher in added sugar or refined carbs and should be avoided.  Talk to your dietitian to identify your daily  goals for nutrients listed on the label. Shopping  Avoid buying canned, premade, or processed foods. These foods tend to be high in fat, sodium, and added sugar.  Shop around the outside edge of the grocery store. This includes fresh fruits and vegetables, bulk grains, fresh meats, and fresh dairy. Cooking  Use low-heat cooking methods, such as baking, instead of high-heat cooking methods like deep frying.  Cook using healthy oils, such as olive, canola, or sunflower oil.  Avoid cooking with butter, cream, or high-fat meats. Meal planning  Eat meals and snacks regularly, preferably at the same times every day. Avoid going long periods of time without eating.  Eat foods high in fiber, such as fresh fruits, vegetables, beans, and whole grains. Talk to your dietitian about how many servings of carbs you can eat at each meal.  Eat 4-6 ounces (oz) of lean protein each day, such as lean meat, chicken, fish, eggs, or tofu. One oz of lean protein is equal to: ? 1 oz of meat, chicken, or fish. ? 1 egg. ?  cup of tofu.  Eat some foods each day that contain healthy fats, such as avocado, nuts, seeds, and fish. Lifestyle  Check your blood glucose regularly.  Exercise regularly as told by your health care provider. This may include: ? 150 minutes of moderate-intensity or vigorous-intensity exercise each week. This could be brisk walking, biking, or water aerobics. ? Stretching and doing strength exercises, such as yoga or weightlifting, at least 2 times a week.  Take medicines as told by your health care provider.  Do not use any products that contain nicotine or tobacco, such as cigarettes and e-cigarettes. If you need help quitting, ask your health care provider.  Work with a Veterinary surgeoncounselor or diabetes educator to identify strategies to manage stress and any emotional and social challenges. Questions to ask a health care provider  Do I need to meet with a diabetes educator?  Do I need to  meet with a dietitian?  What number can I call if I have questions?  When are the best times to check my blood glucose? Where to find more information:  American Diabetes Association: diabetes.org  Academy of Nutrition and Dietetics: www.eatright.[employer]  National Institute of Diabetes and Digestive and Kidney Diseases (NIH): [employer].tnwww.niddk.nih.gov Summary  A healthy meal plan will help you control your blood glucose and maintain a healthy lifestyle.  Working with a diet and nutrition specialist (dietitian) can help you make a meal plan that is best for you.  Keep in mind that carbohydrates (carbs) and alcohol have immediate effects on your blood glucose levels. It is important to count carbs and to use alcohol carefully. This information is not intended to replace advice given to you by your health care provider. Make sure you discuss any questions you have with your health care provider. Document Released: 10/01/2004 Document Revised: 12/17/2016 Document Reviewed: 02/09/2016 Elsevier Patient Education  2020 Elsevier Inc. Diabetes Mellitus and Exercise Exercising regularly is  important for your overall health, especially when you have diabetes (diabetes mellitus). Exercising is not only about losing weight. It has many other health benefits, such as increasing muscle strength and bone density and reducing body fat and stress. This leads to improved fitness, flexibility, and endurance, all of which result in better overall health. Exercise has additional benefits for people with diabetes, including:  Reducing appetite.  Helping to lower and control blood glucose.  Lowering blood pressure.  Helping to control amounts of fatty substances (lipids) in the blood, such as cholesterol and triglycerides.  Helping the body to respond better to insulin (improving insulin sensitivity).  Reducing how much insulin the body needs.  Decreasing the risk for heart disease by: ? Lowering cholesterol and  triglyceride levels. ? Increasing the levels of good cholesterol. ? Lowering blood glucose levels. What is my activity plan? Your health care provider or certified diabetes educator can help you make a plan for the type and frequency of exercise (activity plan) that works for you. Make sure that you:  Do at least 150 minutes of moderate-intensity or vigorous-intensity exercise each week. This could be brisk walking, biking, or water aerobics. ? Do stretching and strength exercises, such as yoga or weightlifting, at least 2 times a week. ? Spread out your activity over at least 3 days of the week.  Get some form of physical activity every day. ? Do not go more than 2 days in a row without some kind of physical activity. ? Avoid being inactive for more than 30 minutes at a time. Take frequent breaks to walk or stretch.  Choose a type of exercise or activity that you enjoy, and set realistic goals.  Start slowly, and gradually increase the intensity of your exercise over time. What do I need to know about managing my diabetes?   Check your blood glucose before and after exercising. ? If your blood glucose is 240 mg/dL (13.3 mmol/L) or higher before you exercise, check your urine for ketones. If you have ketones in your urine, do not exercise until your blood glucose returns to normal. ? If your blood glucose is 100 mg/dL (5.6 mmol/L) or lower, eat a snack containing 15-20 grams of carbohydrate. Check your blood glucose 15 minutes after the snack to make sure that your level is above 100 mg/dL (5.6 mmol/L) before you start your exercise.  Know the symptoms of low blood glucose (hypoglycemia) and how to treat it. Your risk for hypoglycemia increases during and after exercise. Common symptoms of hypoglycemia can include: ? Hunger. ? Anxiety. ? Sweating and feeling clammy. ? Confusion. ? Dizziness or feeling light-headed. ? Increased heart rate or palpitations. ? Blurry vision. ? Tingling or  numbness around the mouth, lips, or tongue. ? Tremors or shakes. ? Irritability.  Keep a rapid-acting carbohydrate snack available before, during, and after exercise to help prevent or treat hypoglycemia.  Avoid injecting insulin into areas of the body that are going to be exercised. For example, avoid injecting insulin into: ? The arms, when playing tennis. ? The legs, when jogging.  Keep records of your exercise habits. Doing this can help you and your health care provider adjust your diabetes management plan as needed. Write down: ? Food that you eat before and after you exercise. ? Blood glucose levels before and after you exercise. ? The type and amount of exercise you have done. ? When your insulin is expected to peak, if you use insulin. Avoid exercising at times when  your insulin is peaking.  When you start a new exercise or activity, work with your health care provider to make sure the activity is safe for you, and to adjust your insulin, medicines, or food intake as needed.  Drink plenty of water while you exercise to prevent dehydration or heat stroke. Drink enough fluid to keep your urine clear or pale yellow. Summary  Exercising regularly is important for your overall health, especially when you have diabetes (diabetes mellitus).  Exercising has many health benefits, such as increasing muscle strength and bone density and reducing body fat and stress.  Your health care provider or certified diabetes educator can help you make a plan for the type and frequency of exercise (activity plan) that works for you.  When you start a new exercise or activity, work with your health care provider to make sure the activity is safe for you, and to adjust your insulin, medicines, or food intake as needed. This information is not intended to replace advice given to you by your health care provider. Make sure you discuss any questions you have with your health care provider. Document  Released: 03/27/2003 Document Revised: 07/29/2016 Document Reviewed: 06/16/2015 Elsevier Patient Education  2020 [employer].

## 2018-10-13 ENCOUNTER — Ambulatory Visit: Payer: Self-pay | Admitting: Family Medicine

## 2019-06-06 ENCOUNTER — Encounter (HOSPITAL_BASED_OUTPATIENT_CLINIC_OR_DEPARTMENT_OTHER): Payer: Self-pay | Admitting: *Deleted

## 2019-06-06 ENCOUNTER — Other Ambulatory Visit: Payer: Self-pay

## 2019-06-06 DIAGNOSIS — Z7984 Long term (current) use of oral hypoglycemic drugs: Secondary | ICD-10-CM | POA: Insufficient documentation

## 2019-06-06 DIAGNOSIS — Z7982 Long term (current) use of aspirin: Secondary | ICD-10-CM | POA: Diagnosis not present

## 2019-06-06 DIAGNOSIS — Z87442 Personal history of urinary calculi: Secondary | ICD-10-CM | POA: Diagnosis not present

## 2019-06-06 DIAGNOSIS — I10 Essential (primary) hypertension: Secondary | ICD-10-CM | POA: Diagnosis not present

## 2019-06-06 DIAGNOSIS — F17210 Nicotine dependence, cigarettes, uncomplicated: Secondary | ICD-10-CM | POA: Diagnosis not present

## 2019-06-06 DIAGNOSIS — E119 Type 2 diabetes mellitus without complications: Secondary | ICD-10-CM | POA: Diagnosis not present

## 2019-06-06 DIAGNOSIS — R1031 Right lower quadrant pain: Secondary | ICD-10-CM | POA: Diagnosis present

## 2019-06-06 DIAGNOSIS — Z79899 Other long term (current) drug therapy: Secondary | ICD-10-CM | POA: Diagnosis not present

## 2019-06-06 LAB — URINALYSIS, MICROSCOPIC (REFLEX): WBC, UA: NONE SEEN WBC/hpf (ref 0–5)

## 2019-06-06 LAB — URINALYSIS, ROUTINE W REFLEX MICROSCOPIC
Bilirubin Urine: NEGATIVE
Glucose, UA: >=500 mg/dL — AB
Ketones, ur: 15 mg/dL — AB
Leukocytes,Ua: NEGATIVE
Nitrite: NEGATIVE
Protein, ur: NEGATIVE mg/dL
Specific Gravity, Urine: >1.03 — ABNORMAL HIGH (ref 1.005–1.030)
pH: 6 (ref 5.0–8.0)

## 2019-06-06 NOTE — ED Triage Notes (Addendum)
Pt c/o right flank pain x " months" with know kidneys stones , being followed by Urology with recent lithotripsy , last Renal CT 05/03/19 for same

## 2019-06-07 ENCOUNTER — Emergency Department (HOSPITAL_BASED_OUTPATIENT_CLINIC_OR_DEPARTMENT_OTHER)
Admission: EM | Admit: 2019-06-07 | Discharge: 2019-06-07 | Disposition: A | Discharge status: Home / Self Care | Payer: Medicaid - Out of State | Attending: Emergency Medicine | Admitting: Emergency Medicine

## 2019-06-07 DIAGNOSIS — R109 Unspecified abdominal pain: Secondary | ICD-10-CM

## 2019-06-07 MED ORDER — OXYCODONE HCL 5 MG PO TABS
5.0000 mg | ORAL_TABLET | Freq: Once | ORAL | Status: AC
  Administered 2019-06-07: 5 mg via ORAL
  Filled 2019-06-07: qty 1

## 2019-06-07 MED ORDER — FENTANYL CITRATE (PF) 100 MCG/2ML IJ SOLN
100.0000 ug | Freq: Once | INTRAMUSCULAR | Status: AC
  Administered 2019-06-07: 100 ug via INTRAVENOUS
  Filled 2019-06-07: qty 2

## 2019-06-07 MED ORDER — OXYCODONE HCL 5 MG PO CAPS: 5.0000 mg | ORAL_CAPSULE | Freq: Four times a day (QID) | ORAL | 0 refills | Status: DC | PRN

## 2019-06-07 MED FILL — oxyCODONE HCL 5 MG TABS: 5 | 8 days supply | Qty: 30 | Fill #0

## 2019-06-07 NOTE — ED Provider Notes (Signed)
[facility] Provider Note: Georgena Spurling, MD, FACEP  CSN: [id number] MRN: [id number] ARRIVAL: 06/06/19 at 2114 ROOM: [place]  Flank Pain   HISTORY OF PRESENT ILLNESS  06/07/19 2:47 AM Michael Carroll is a 59 y.o. male with a history of kidney stones.  His most recent CT was performed 05/16/2019 which showed "resolution of previous nephroureteral calculus on the right side.  No obstructing calculus or hydronephrosis."  The patient states he has not yet followed up with urology to get results of that scan.  He is here with right flank pain that has been present for several weeks.  As noted above he is being followed by urology but has not yet followed up.  He is visiting here from [facility] where his primary care physician has been managing his pain.  He is out of his pain medication and is here with an exacerbation of his right flank pain which he rates as a 7 out of 10.  It does not significantly exacerbated with movement.  He is not vomiting with it.  He is not having hematuria.   Past Medical History:  Diagnosis Date  . At risk for sleep apnea    STOP-BANG=  4     . Cirrhosis of liver without mention of alcohol    PER LIVER BX 03/2012  . Headache(784.0)    migraines  . History of kidney stones   . History of migraine   . Hypertension    NO MEDS  . Kidney stone   . Productive cough   . Right ureteral stone   . Smokers' cough ([place])   . Type 2 diabetes, diet controlled ([place])     Past Surgical History:  Procedure Laterality Date  . CARDIOVASCULAR STRESS TEST  2004   CARDIOLITE STUDY NORMAL/  EF 60%  . CHOLECYSTECTOMY N/A 03/26/2012   Procedure: LAPAROSCOPIC CHOLECYSTECTOMY;  Surgeon: Leighton Ruff, MD;  Location: [facility];  Service: General;  Laterality: N/A;  . CYSTO/   RIGHT URETERAL STENT EXCHANGE  12/2007  . CYSTO/  RIGHT URETERAL STENT PLACEMENT  06/2006  . CYSTOSCOPY  07/11/2013   Procedure: CYSTOSCOPY RIGHT STENT REMOVAL;   Surgeon: Ardis Hughs, MD;  Location: [facility];  Service: Urology;;  . Consuela Mimes WITH RETROGRADE PYELOGRAM, URETEROSCOPY AND STENT PLACEMENT Right 05/15/2013   Procedure: CYSTOSCOPY WITH RETROGRADE PYELOGRAM, URETEROSCOPY AND STENT PLACEMENT;  Surgeon: Ardis Hughs, MD;  Location: [facility];  Service: Urology;  Laterality: Right;  . CYSTOSCOPY WITH RETROGRADE PYELOGRAM, URETEROSCOPY AND STENT PLACEMENT Right 05/30/2013   Procedure: CYSTOSCOPY WITH RIGHT RETROGRADE PYELOGRAM, URETEROSCOPY LASER LITHOTRIPSY AND STENT EXCHANGE ;  Surgeon: Ardis Hughs, MD;  Location: [facility];  Service: Urology;  Laterality: Right;  . EXTRACORPOREAL SHOCK WAVE LITHOTRIPSY    . HOLMIUM LASER APPLICATION Right 09/05/2991   Procedure: HOLMIUM LASER APPLICATION;  Surgeon: Ardis Hughs, MD;  Location: [facility];  Service: Urology;  Laterality: Right;  . LIVER BIOPSY N/A 03/26/2012   Procedure: LIVER BIOPSY;  Surgeon: Leighton Ruff, MD;  Location: [facility];  Service: General;  Laterality: N/A;    Family History  Problem Relation Age of Onset  . Cancer - Lung Mother   . Cancer Mother   . Seizures Sister   . Heart disease Father     Social History   Tobacco Use  . Smoking status: Current Every Day Smoker    Packs/day: 1.50    Years: 25.00  Pack years: 37.50    Types: Cigarettes  . Smokeless tobacco: Never Used  Substance Use Topics  . Alcohol use: No  . Drug use: No    Prior to Admission medications   Medication Sig Start Date End Date Taking? Authorizing Provider  amLODipine (NORVASC) 10 MG tablet Take 1 tablet (10 mg total) by mouth daily. 09/19/18   Argentina Donovan, PA-C  aspirin EC 81 MG tablet Take 81 mg by mouth daily.    [provider]  Blood Glucose Monitoring Suppl (TRUE METRIX METER) w/Device KIT 1 each by Does not apply route 2 (two) times daily. 09/19/18   Argentina Donovan, PA-C  glipiZIDE (GLUCOTROL) 5 MG tablet Take 1 tablet (5 mg  total) by mouth 2 (two) times daily before a meal. 09/19/18   McClung, Dionne Bucy, PA-C  glucose blood (TRUE METRIX BLOOD GLUCOSE TEST) test strip Use as instructed 09/19/18   Argentina Donovan, PA-C  metFORMIN (GLUCOPHAGE) 500 MG tablet Take 1 tablet (500 mg total) by mouth 2 (two) times daily with a meal. 03/30/17   Davonna Belling, MD  oxycodone (OXY-IR) 5 MG capsule Take 1 capsule (5 mg total) by mouth every 6 (six) hours as needed for pain. 06/07/19   Zacharius Funari, MD  TRUEplus Lancets 28G MISC 1 each by Does not apply route 2 (two) times daily. 09/19/18   Argentina Donovan, PA-C  metoCLOPramide (REGLAN) 10 MG tablet Take 1 tablet (10 mg total) by mouth every 8 (eight) hours as needed (headache). 10/15/15 06/07/19  Everlene Balls, MD    Allergies Zofran Alvis Lemmings hcl] and Compazine   REVIEW OF SYSTEMS  Negative except as noted here or in the History of Present Illness.   PHYSICAL EXAMINATION  Initial Vital Signs Blood pressure (!) 146/99, pulse 81, temperature 98.1 F (36.7 C), temperature source Oral, resp. rate 16, height 6' (1.829 m), weight 104.8 kg, SpO2 98 %.  Examination General: Well-developed, well-nourished male in no acute distress; appearance consistent with age of record HENT: normocephalic; atraumatic Eyes: pupils equal, round and reactive to light; extraocular muscles intact Neck: supple Heart: regular rate and rhythm Lungs: clear to auscultation bilaterally Abdomen: soft; nondistended; mild right upper quadrant tenderness; bowel sounds present GU: Mild CVA tenderness Extremities: No deformity; full range of motion; pulses normal Neurologic: Awake, alert and oriented; motor function intact in all extremities and symmetric; no facial droop Skin: Warm and dry Psychiatric: Normal mood and affect   RESULTS  Summary of this visit's results, reviewed and interpreted by myself:   EKG Interpretation  Date/Time:    Ventricular Rate:    PR Interval:    QRS Duration:   QT  Interval:    QTC Calculation:   R Axis:     Text Interpretation:        Laboratory Studies: Results for orders placed or performed during the hospital encounter of 06/07/19 (from the past 24 hour(s))  Urinalysis, Routine w reflex microscopic     Status: Abnormal   Collection Time: 06/06/19  9:30 PM  Result Value Ref Range   Color, Urine YELLOW YELLOW   APPearance CLEAR CLEAR   Specific Gravity, Urine >1.030 (H) 1.005 - 1.030   pH 6.0 5.0 - 8.0   Glucose, UA >=500 (A) NEGATIVE mg/dL   Hgb urine dipstick TRACE (A) NEGATIVE   Bilirubin Urine NEGATIVE NEGATIVE   Ketones, ur 15 (A) NEGATIVE mg/dL   Protein, ur NEGATIVE NEGATIVE mg/dL   Nitrite NEGATIVE NEGATIVE   Leukocytes,Ua  NEGATIVE NEGATIVE  Urinalysis, Microscopic (reflex)     Status: Abnormal   Collection Time: 06/06/19  9:30 PM  Result Value Ref Range   RBC / HPF 0-5 0 - 5 RBC/hpf   WBC, UA NONE SEEN 0 - 5 WBC/hpf   Bacteria, UA RARE (A) NONE SEEN   Squamous Epithelial / LPF 6-10 0 - 5   Mucus PRESENT    Imaging Studies: No results found.  ED COURSE and MDM  Nursing notes, initial and subsequent vitals signs, including pulse oximetry, reviewed and interpreted by myself.  Vitals:   06/06/19 2126 06/06/19 2127 06/07/19 0117  BP: (!) 145/100  (!) 146/99  Pulse: (!) 117  81  Resp: 18  16  Temp: 98.1 F (36.7 C)    TempSrc: Oral    SpO2: 99%  98%  Weight:  104.8 kg   Height:  6' (1.829 m)    Medications  oxyCODONE (Oxy IR/ROXICODONE) immediate release tablet 5 mg (has no administration in time range)  fentaNYL (SUBLIMAZE) injection 100 mcg (100 mcg Intravenous Given 06/07/19 0133)   Patient's pain significantly improved after IV fentanyl.  We will refill his oxycodone pending return to [place].   PROCEDURES  Procedures   ED DIAGNOSES     ICD-10-CM   1. Right flank pain  R10.9        Abisola Carrero, Jenny Reichmann, MD 06/07/19 [phone]

## 2021-07-28 DIAGNOSIS — I2510 Atherosclerotic heart disease of native coronary artery without angina pectoris: Secondary | ICD-10-CM | POA: Diagnosis present

## 2021-07-28 DIAGNOSIS — E785 Hyperlipidemia, unspecified: Secondary | ICD-10-CM | POA: Diagnosis present

## 2022-01-14 ENCOUNTER — Encounter (HOSPITAL_BASED_OUTPATIENT_CLINIC_OR_DEPARTMENT_OTHER): Payer: Self-pay

## 2022-01-14 ENCOUNTER — Emergency Department (HOSPITAL_BASED_OUTPATIENT_CLINIC_OR_DEPARTMENT_OTHER): Payer: Medicaid - Out of State

## 2022-01-14 ENCOUNTER — Other Ambulatory Visit: Payer: Self-pay

## 2022-01-14 ENCOUNTER — Encounter (HOSPITAL_COMMUNITY): Payer: Self-pay

## 2022-01-14 ENCOUNTER — Observation Stay (HOSPITAL_BASED_OUTPATIENT_CLINIC_OR_DEPARTMENT_OTHER)
Admission: EM | Admit: 2022-01-14 | Discharge: 2022-01-15 | Disposition: A | Discharge status: Home / Self Care | Payer: Medicaid - Out of State | Attending: Family Medicine | Admitting: Family Medicine

## 2022-01-14 DIAGNOSIS — E1165 Type 2 diabetes mellitus with hyperglycemia: Secondary | ICD-10-CM | POA: Diagnosis not present

## 2022-01-14 DIAGNOSIS — Z7984 Long term (current) use of oral hypoglycemic drugs: Secondary | ICD-10-CM | POA: Insufficient documentation

## 2022-01-14 DIAGNOSIS — Z79899 Other long term (current) drug therapy: Secondary | ICD-10-CM | POA: Insufficient documentation

## 2022-01-14 DIAGNOSIS — I471 Supraventricular tachycardia, unspecified: Secondary | ICD-10-CM | POA: Diagnosis not present

## 2022-01-14 DIAGNOSIS — R079 Chest pain, unspecified: Secondary | ICD-10-CM | POA: Diagnosis present

## 2022-01-14 DIAGNOSIS — E785 Hyperlipidemia, unspecified: Secondary | ICD-10-CM | POA: Diagnosis present

## 2022-01-14 DIAGNOSIS — I2510 Atherosclerotic heart disease of native coronary artery without angina pectoris: Secondary | ICD-10-CM | POA: Diagnosis not present

## 2022-01-14 DIAGNOSIS — I4719 Other supraventricular tachycardia: Secondary | ICD-10-CM

## 2022-01-14 DIAGNOSIS — I10 Essential (primary) hypertension: Secondary | ICD-10-CM | POA: Diagnosis present

## 2022-01-14 DIAGNOSIS — Z955 Presence of coronary angioplasty implant and graft: Secondary | ICD-10-CM | POA: Diagnosis not present

## 2022-01-14 DIAGNOSIS — Z7982 Long term (current) use of aspirin: Secondary | ICD-10-CM | POA: Diagnosis not present

## 2022-01-14 DIAGNOSIS — Z720 Tobacco use: Secondary | ICD-10-CM | POA: Insufficient documentation

## 2022-01-14 DIAGNOSIS — R0789 Other chest pain: Secondary | ICD-10-CM | POA: Diagnosis present

## 2022-01-14 DIAGNOSIS — F17210 Nicotine dependence, cigarettes, uncomplicated: Secondary | ICD-10-CM | POA: Diagnosis not present

## 2022-01-14 LAB — BASIC METABOLIC PANEL
Anion gap: 8 (ref 5–15)
BUN: 19 mg/dL (ref 6–20)
CO2: 25 mmol/L (ref 22–32)
Calcium: 9.6 mg/dL (ref 8.9–10.3)
Chloride: 106 mmol/L (ref 98–111)
Creatinine, Ser: 0.98 mg/dL (ref 0.61–1.24)
GFR, Estimated: >60 mL/min (ref >60)
Glucose, Bld: 113 mg/dL — ABNORMAL HIGH (ref 70–99)
Potassium: 3.8 mmol/L (ref 3.5–5.1)
Sodium: 139 mmol/L (ref 135–145)

## 2022-01-14 LAB — CBC
HCT: 46.9 % (ref 39.0–52.0)
Hemoglobin: 16.4 g/dL (ref 13.0–17.0)
MCH: 29.1 pg (ref 26.0–34.0)
MCHC: 35 g/dL (ref 30.0–36.0)
MCV: 83.2 fL (ref 80.0–100.0)
Platelets: 151 10*3/uL (ref 150–400)
RBC: 5.64 MIL/uL (ref 4.22–5.81)
RDW: 12.8 % (ref 11.5–15.5)
WBC: 5.6 10*3/uL (ref 4.0–10.5)
nRBC: 0 % (ref 0.0–0.2)

## 2022-01-14 LAB — TROPONIN I (HIGH SENSITIVITY)
Troponin I (High Sensitivity): 7 ng/L (ref <18)
Troponin I (High Sensitivity): 7 ng/L (ref <18)

## 2022-01-14 MED ORDER — NITROGLYCERIN 0.4 MG SL SUBL
0.4000 mg | SUBLINGUAL_TABLET | SUBLINGUAL | Status: DC | PRN
  Administered 2022-01-14 (×2): 0.4 mg via SUBLINGUAL
  Filled 2022-01-14: qty 1

## 2022-01-14 MED ORDER — ASPIRIN 81 MG PO CHEW
324.0000 mg | CHEWABLE_TABLET | Freq: Once | ORAL | Status: AC
  Administered 2022-01-14: 324 mg via ORAL
  Filled 2022-01-14: qty 4

## 2022-01-14 NOTE — ED Notes (Signed)
Presents with left ant chest pain, 8/10 scale, feels like a sharp pain, radiates to left arm and left axilla area. Has had some shortness of breath, out walking around outside, nothing exertional begin having chest discomfort. Presented to our ED to his past medical hx. NSR noted on monitor without ectopy, BS clear, resp even and nonlabored. POX 98% on RA. Skin warm and dry, 2+ radial pulses, no dependent edema noted

## 2022-01-14 NOTE — ED Provider Notes (Signed)
[name] Provider Note   CSN: [id number] Arrival date & time: 01/14/22  1627     History  Chief Complaint  Patient presents with   Chest Pain   Shortness of Breath   Nausea    Michael Carroll is a 61 y.o. male.  Patient is a 61 year old male with a history of hypertension, diabetes, tobacco use and coronary artery disease status post stent placement in June of this year who presents with chest pain.  He states he was at home today not really doing anything and had a intense sharp type pain in the left chest.  It radiates under the left axilla area.  He says it is sharp and intense.  He had some mild shortness of breath with it.  He had some nausea but no vomiting.  No diaphoresis.  He states that it felt exactly the same as when he had his heart attack with a stent placement in June.  It was the same type of pain with the same associated symptoms.  He says the pain today did not start with exertion but seem to get a little worse when he was walking around or exerting himself.  He has had some small intermittent pains over the last couple days which have been relieved with nitroglycerin.  Today he did not take any nitroglycerin but came here for evaluation because it felt similar to his prior MI.  He had his prior stent placed in [place] where he currently resides.       Home Medications Prior to Admission medications   Medication Sig Start Date End Date Taking? Authorizing Provider  amLODipine (NORVASC) 10 MG tablet Take 1 tablet (10 mg total) by mouth daily. 09/19/18   Argentina Donovan, PA-C  aspirin EC 81 MG tablet Take 81 mg by mouth daily.    [provider]  Blood Glucose Monitoring Suppl (TRUE METRIX METER) w/Device KIT 1 each by Does not apply route 2 (two) times daily. 09/19/18   Argentina Donovan, PA-C  glipiZIDE (GLUCOTROL) 5 MG tablet Take 1 tablet (5 mg total) by mouth 2 (two) times daily before a meal. 09/19/18   McClung, Dionne Bucy,  PA-C  glucose blood (TRUE METRIX BLOOD GLUCOSE TEST) test strip Use as instructed 09/19/18   Argentina Donovan, PA-C  metFORMIN (GLUCOPHAGE) 500 MG tablet Take 1 tablet (500 mg total) by mouth 2 (two) times daily with a meal. 03/30/17   Davonna Belling, MD  oxycodone (OXY-IR) 5 MG capsule Take 1 capsule (5 mg total) by mouth every 6 (six) hours as needed for pain. 06/07/19   Molpus, John, MD  TRUEplus Lancets 28G MISC 1 each by Does not apply route 2 (two) times daily. 09/19/18   Argentina Donovan, PA-C  metoCLOPramide (REGLAN) 10 MG tablet Take 1 tablet (10 mg total) by mouth every 8 (eight) hours as needed (headache). 10/15/15 06/07/19  Everlene Balls, MD      Allergies    Zofran Alvis Lemmings hcl] and Compazine    Review of Systems   Review of Systems  Constitutional:  Negative for chills, diaphoresis, fatigue and fever.  HENT:  Negative for congestion, rhinorrhea and sneezing.   Eyes: Negative.   Respiratory:  Positive for shortness of breath. Negative for cough and chest tightness.   Cardiovascular:  Positive for chest pain. Negative for leg swelling.  Gastrointestinal:  Positive for nausea. Negative for abdominal pain, blood in stool, diarrhea and vomiting.  Genitourinary:  Negative for  difficulty urinating, flank pain, frequency and hematuria.  Musculoskeletal:  Negative for arthralgias and back pain.  Skin:  Negative for rash.  Neurological:  Negative for dizziness, speech difficulty, weakness, numbness and headaches.    Physical Exam Updated Vital Signs BP (!) 160/78 (BP Location: Left Arm)   Pulse 63   Temp 97.9 F (36.6 C) (Oral)   Resp 18   SpO2 98%  Physical Exam Constitutional:      Appearance: He is well-developed.  HENT:     Head: Normocephalic and atraumatic.  Eyes:     Pupils: Pupils are equal, round, and reactive to light.  Cardiovascular:     Rate and Rhythm: Normal rate and regular rhythm.     Heart sounds: Normal heart sounds.     Comments: Some mild  reproducibility to the anterior left chest wall Pulmonary:     Effort: Pulmonary effort is normal. No respiratory distress.     Breath sounds: Normal breath sounds. No wheezing or rales.  Chest:     Chest wall: No tenderness.  Abdominal:     General: Bowel sounds are normal.     Palpations: Abdomen is soft.     Tenderness: There is no abdominal tenderness. There is no guarding or rebound.  Musculoskeletal:        General: Normal range of motion.     Cervical back: Normal range of motion and neck supple.     Comments: No edema or calf tenderness  Lymphadenopathy:     Cervical: No cervical adenopathy.  Skin:    General: Skin is warm and dry.     Findings: No rash.  Neurological:     Mental Status: He is alert and oriented to person, place, and time.     ED Results / Procedures / Treatments   Labs (all labs ordered are listed, but only abnormal results are displayed) Labs Reviewed  BASIC METABOLIC PANEL - Abnormal; Notable for the following components:      Result Value   Glucose, Bld 113 (*)    All other components within normal limits  CBC  TROPONIN I (HIGH SENSITIVITY)  TROPONIN I (HIGH SENSITIVITY)    EKG EKG Interpretation  Date/Time:  Thursday January 14 2022 16:43:01 EST Ventricular Rate:  91 PR Interval:  184 QRS Duration: 84 QT Interval:  360 QTC Calculation: 442 R Axis:   -38 Text Interpretation: Normal sinus rhythm Left axis deviation Abnormal ECG When compared with ECG of 28-May-2018 21:23, PREVIOUS ECG IS PRESENT Since last tracing No significant change was found Confirmed by Malvin Johns [phone]) on 01/14/2022 5:18:21 PM  Radiology DG Chest 2 View  Result Date: 01/14/2022 CLINICAL DATA:  Chest pain radiating to arm and neck, nausea, and shortness of breath beginning 1 hour ago EXAM: CHEST - 2 VIEW COMPARISON:  03/30/2017 FINDINGS: Normal heart size, mediastinal contours, and pulmonary vascularity. Lungs clear. No pleural effusion or pneumothorax.  Bones unremarkable. IMPRESSION: Normal exam. Electronically Signed   By: Lavonia Dana M.D.   On: 01/14/2022 17:05    Procedures Procedures    Medications Ordered in ED Medications  nitroGLYCERIN (NITROSTAT) SL tablet 0.4 mg (0.4 mg Sublingual Given 01/14/22 1800)  aspirin chewable tablet 324 mg (324 mg Oral Given 01/14/22 1753)    ED Course/ Medical Decision Making/ A&P           HEART Score: 4                Medical Decision Making Amount and/or Complexity  of Data Reviewed Labs: ordered. Radiology: ordered.  Risk OTC drugs. Prescription drug management. Decision regarding hospitalization.   Patient is a 61 year old male who presents with chest pain.  He says his symptoms are consistent to his prior anginal symptoms when he had a stent placed.  He received aspirin on arrival as well as nitroglycerin.  He was pain-free after nitroglycerin.  Chest x-ray two-view showed no acute disease.  This was interpreted by me and confirmed by the radiologist.  No evidence of pneumonia, pneumothorax or pulmonary edema.  His EKG does not show any acute ischemic changes.  He had 2 negative troponins.  His other labs are nonconcerning.  He does have heart score of 4.  Given his high risk, I feel that he needs to be admitted for further cardiac evaluation.  I spoke with Dr. Nevada Crane who will admit the patient for further treatment.  Final Clinical Impression(s) / ED Diagnoses Final diagnoses:  Chest pain, unspecified type    Rx / DC Orders ED Discharge Orders     None         Malvin Johns, MD 01/14/22 2134

## 2022-01-14 NOTE — ED Notes (Signed)
Cont to have left ant chest pain, sharp, with radiation to left axilla and left arm NSR noted at 68/min with NBP 151/76 SL NTG x 1 administered, with 4 BABY ASA chewables

## 2022-01-14 NOTE — ED Notes (Signed)
States he is currently pain free at this time, NSR without ectopy noted on monitor. HR at 64/min, NBP 116/79. Total of 2 SL NTG were administered for chest pain

## 2022-01-14 NOTE — ED Triage Notes (Signed)
Pt c/o CP radiating to arm/ neck, nausea, SHOB onset 1hr ago.  Reports last couple of days, had to take NTG for same, last dose approx 1.5hrs ago, 81mg  ASA this AM  Hx MI, stent

## 2022-01-14 NOTE — Progress Notes (Signed)
Plan of Care Note for accepted transfer   Patient: Michael Carroll MRN: [id number]   DOA: 01/14/2022  Facility requesting transfer: [facility] ED Requesting Provider: Dr. Fredderick Phenix, EDP. Reason for transfer: Chest pain, rule out ACS.  Facility course: The patient is a 61 year old male with past medical history significant for coronary artery disease status post PCI with stenting in June 2023([place]), hypertension, hyperlipidemia, type 2 diabetes, who presented with nonexertional chest pain, left-sided, mildly reproducible on palpation, worsened with exertion, felt similar to prior event that led to cardiac stent placement.  He took 2 nitroglycerin tablets with improvement of his symptoms.  He presented to the ED for further evaluation.  In the ED, chest x-ray was nonacute, high-sensitivity troponin was negative x 2.  No evidence of acute ischemia on twelve-lead EKG.  Chest pain improved.  Due to elevated heart score, EDP requested admission for further management.  The patient was admitted at [facility] telemetry cardiac unit as observation status.  Plan of care: The patient is accepted for admission to Telemetry unit, at [facility] as observation status.  Author: Darlin Drop, DO 01/14/2022  Check [email or web address] for on-call coverage.  Nursing staff, Please call [facility] Admits & Consults System-Wide number on Amion as soon as patient's arrival, so appropriate admitting provider can evaluate the pt.

## 2022-01-15 ENCOUNTER — Observation Stay (HOSPITAL_BASED_OUTPATIENT_CLINIC_OR_DEPARTMENT_OTHER): Payer: Medicaid - Out of State

## 2022-01-15 ENCOUNTER — Other Ambulatory Visit (HOSPITAL_BASED_OUTPATIENT_CLINIC_OR_DEPARTMENT_OTHER): Payer: Self-pay

## 2022-01-15 DIAGNOSIS — Z7982 Long term (current) use of aspirin: Secondary | ICD-10-CM | POA: Diagnosis not present

## 2022-01-15 DIAGNOSIS — Z720 Tobacco use: Secondary | ICD-10-CM | POA: Insufficient documentation

## 2022-01-15 DIAGNOSIS — Z79899 Other long term (current) drug therapy: Secondary | ICD-10-CM | POA: Diagnosis not present

## 2022-01-15 DIAGNOSIS — I10 Essential (primary) hypertension: Secondary | ICD-10-CM | POA: Diagnosis not present

## 2022-01-15 DIAGNOSIS — Z7984 Long term (current) use of oral hypoglycemic drugs: Secondary | ICD-10-CM | POA: Diagnosis not present

## 2022-01-15 DIAGNOSIS — I2510 Atherosclerotic heart disease of native coronary artery without angina pectoris: Secondary | ICD-10-CM

## 2022-01-15 DIAGNOSIS — R079 Chest pain, unspecified: Secondary | ICD-10-CM | POA: Diagnosis not present

## 2022-01-15 DIAGNOSIS — E1165 Type 2 diabetes mellitus with hyperglycemia: Secondary | ICD-10-CM | POA: Diagnosis not present

## 2022-01-15 DIAGNOSIS — R0789 Other chest pain: Secondary | ICD-10-CM | POA: Diagnosis present

## 2022-01-15 DIAGNOSIS — I4719 Other supraventricular tachycardia: Secondary | ICD-10-CM

## 2022-01-15 DIAGNOSIS — I471 Supraventricular tachycardia, unspecified: Secondary | ICD-10-CM | POA: Diagnosis not present

## 2022-01-15 DIAGNOSIS — Z955 Presence of coronary angioplasty implant and graft: Secondary | ICD-10-CM | POA: Diagnosis not present

## 2022-01-15 DIAGNOSIS — F17210 Nicotine dependence, cigarettes, uncomplicated: Secondary | ICD-10-CM | POA: Diagnosis not present

## 2022-01-15 LAB — ECHOCARDIOGRAM COMPLETE
AR max vel: 2.71 cm2
AV Area VTI: 2.54 cm2
AV Area mean vel: 2.58 cm2
AV Mean grad: 8 mmHg
AV Peak grad: 13.7 mmHg
Ao pk vel: 1.85 m/s
Area-P 1/2: 2.73 cm2
Height: 72 in
Weight: 4095.26 oz

## 2022-01-15 LAB — HIV ANTIBODY (ROUTINE TESTING W REFLEX): HIV Screen 4th Generation wRfx: NONREACTIVE

## 2022-01-15 LAB — GLUCOSE, CAPILLARY
Glucose-Capillary: 184 mg/dL — ABNORMAL HIGH (ref 70–99)
Glucose-Capillary: 127 mg/dL — ABNORMAL HIGH (ref 70–99)

## 2022-01-15 MED ORDER — ACETAMINOPHEN 325 MG PO TABS: 650.0000 mg | ORAL_TABLET | ORAL | Status: DC | PRN

## 2022-01-15 MED ORDER — LOSARTAN POTASSIUM 50 MG PO TABS: 100.0000 mg | ORAL_TABLET | Freq: Every day | ORAL | Status: DC

## 2022-01-15 MED ORDER — SODIUM CHLORIDE 0.9% FLUSH: 3.0000 mL | INTRAVENOUS | Status: DC | PRN

## 2022-01-15 MED ORDER — POTASSIUM CITRATE ER 10 MEQ (1080 MG) PO TBCR: 10.0000 meq | EXTENDED_RELEASE_TABLET | Freq: Two times a day (BID) | ORAL | Status: DC

## 2022-01-15 MED ORDER — ASPIRIN 81 MG PO TBEC
81.0000 mg | DELAYED_RELEASE_TABLET | Freq: Every day | ORAL | Status: DC
  Administered 2022-01-15: 81 mg via ORAL
  Filled 2022-01-15: qty 1

## 2022-01-15 MED ORDER — INSULIN ASPART 100 UNIT/ML IJ SOLN
0.0000 [IU] | Freq: Three times a day (TID) | INTRAMUSCULAR | Status: DC
  Administered 2022-01-15: 3 [IU] via SUBCUTANEOUS

## 2022-01-15 MED ORDER — ATORVASTATIN CALCIUM 40 MG PO TABS: 40.0000 mg | ORAL_TABLET | Freq: Every day | ORAL | Status: DC

## 2022-01-15 MED ORDER — PERFLUTREN LIPID MICROSPHERE
1.0000 mL | INTRAVENOUS | Status: AC | PRN
  Administered 2022-01-15: 4 mL via INTRAVENOUS

## 2022-01-15 MED ORDER — HYDROCODONE-ACETAMINOPHEN 5-325 MG PO TABS
1.0000 | ORAL_TABLET | Freq: Two times a day (BID) | ORAL | Status: DC | PRN
  Administered 2022-01-15: 1 via ORAL
  Filled 2022-01-15: qty 1

## 2022-01-15 MED ORDER — HYDROCODONE-ACETAMINOPHEN 5-325 MG PO TABS
1.0000 | ORAL_TABLET | Freq: Once | ORAL | Status: AC
  Administered 2022-01-15: 1 via ORAL
  Filled 2022-01-15: qty 1

## 2022-01-15 MED ORDER — ISOSORBIDE MONONITRATE ER 120 MG PO TB24
120.0000 mg | ORAL_TABLET | Freq: Every day | ORAL | 0 refills | Status: AC
  Filled 2022-01-15: qty 30, 30d supply, fill #0

## 2022-01-15 MED ORDER — NITROGLYCERIN 0.4 MG SL SUBL: 0.4000 mg | SUBLINGUAL_TABLET | SUBLINGUAL | Status: DC | PRN

## 2022-01-15 MED ORDER — CHLORTHALIDONE 25 MG PO TABS
12.5000 mg | ORAL_TABLET | Freq: Every day | ORAL | Status: DC
  Administered 2022-01-15: 12.5 mg via ORAL
  Filled 2022-01-15: qty 0.5

## 2022-01-15 MED ORDER — POTASSIUM CHLORIDE CRYS ER 10 MEQ PO TBCR
10.0000 meq | EXTENDED_RELEASE_TABLET | Freq: Two times a day (BID) | ORAL | Status: DC
  Administered 2022-01-15: 10 meq via ORAL
  Filled 2022-01-15: qty 1

## 2022-01-15 MED ORDER — ENOXAPARIN SODIUM 40 MG/0.4ML IJ SOSY
40.0000 mg | PREFILLED_SYRINGE | INTRAMUSCULAR | Status: DC
  Administered 2022-01-15: 40 mg via SUBCUTANEOUS
  Filled 2022-01-15: qty 0.4

## 2022-01-15 MED ORDER — SODIUM CHLORIDE 0.9% FLUSH
3.0000 mL | Freq: Two times a day (BID) | INTRAVENOUS | Status: DC
  Administered 2022-01-15: 3 mL via INTRAVENOUS

## 2022-01-15 MED ORDER — ASPIRIN 81 MG PO TBEC: 81.0000 mg | DELAYED_RELEASE_TABLET | Freq: Every day | ORAL | Status: DC

## 2022-01-15 MED ORDER — AMLODIPINE BESYLATE 10 MG PO TABS: 10.0000 mg | ORAL_TABLET | Freq: Every day | ORAL | Status: DC

## 2022-01-15 MED ORDER — ISOSORBIDE MONONITRATE ER 60 MG PO TB24: 60.0000 mg | ORAL_TABLET | Freq: Every day | ORAL | Status: DC

## 2022-01-15 MED ORDER — SODIUM CHLORIDE 0.9 % IV SOLN: 250.0000 mL | INTRAVENOUS | Status: DC | PRN

## 2022-01-15 MED ORDER — ASPIRIN 81 MG PO TBEC: 81.0000 mg | DELAYED_RELEASE_TABLET | Freq: Two times a day (BID) | ORAL | Status: DC

## 2022-01-15 MED ORDER — DILTIAZEM HCL ER COATED BEADS 120 MG PO CP24
120.0000 mg | ORAL_CAPSULE | Freq: Every day | ORAL | Status: DC
  Administered 2022-01-15: 120 mg via ORAL
  Filled 2022-01-15: qty 1

## 2022-01-15 MED ORDER — ISOSORBIDE MONONITRATE ER 60 MG PO TB24: 120.0000 mg | ORAL_TABLET | Freq: Every day | ORAL | Status: DC

## 2022-01-15 MED ORDER — CLOPIDOGREL BISULFATE 75 MG PO TABS
75.0000 mg | ORAL_TABLET | Freq: Every day | ORAL | Status: DC
  Administered 2022-01-15: 75 mg via ORAL
  Filled 2022-01-15: qty 1

## 2022-01-15 NOTE — Consult Note (Signed)
Cardiology Consultation:  Patient ID: Michael Carroll MRN: [id number]; DOB: Jun 05, 1960  Admit date: 01/14/2022 Date of Consult: 01/15/2022  Primary Care Provider: Pcp, No Primary Cardiologist: None  Primary Electrophysiologist:  None   Patient Profile:  Michael Carroll is a 61 y.o. male with a hx of CAD status post PCI in June 2023, diabetes, hypertension, hyperlipidemia, tobacco abuse who is being seen today for the evaluation of chest pain at the request of Orland Mustard, MD.  History of Present Illness:  Michael Carroll presents with 1 day of acute chest pain.  He reports he developed sharp chest tightness yesterday around 3 PM.  He reports he was outside working in the yard.  He reports nitroglycerin improved his symptoms.  Symptoms continued on and off and he went to the emergency room.  He reports symptoms have completely resolved.  He reports no fevers or chills.  He did have associated shortness of breath with the chest discomfort.  Troponins are negative.  EKG is nonischemic.  Echo shows normal LV function with no significant wall motion abnormality, EF 60%.  Normal RV function.  No significant valvular heart disease.  Reviewed records from his hospitalization in [place].  In June he suffered a non-STEMI.  Underwent PCI to the proximal RCA.  He had no significant CAD in the LAD or circumflex territories.  His echo was normal there.  His CV risk factors include uncontrolled diabetes and ongoing tobacco abuse.  He apparently has an ex-wife who lives in [place] who he visits frequently.  He is back and forth from [place] to [place].  Symptoms have resolved.  Denies any chest pain or trouble breathing.  Blood pressure is elevated.  Suspect he may have small vessel disease.  We discussed the importance of strict glycemic control as well as blood pressure control.  CV examination is normal.  Heart Pathway Score:       Past Medical History: Past Medical History:   Diagnosis Date   At risk for sleep apnea    STOP-BANG=  4      Cirrhosis of liver without mention of alcohol    PER LIVER BX 03/2012   Headache(784.0)    migraines   History of kidney stones    History of migraine    Hypertension    NO MEDS   Kidney stone    Productive cough    Right ureteral stone    Smokers' cough (HCC)    Type 2 diabetes, diet controlled (HCC)     Past Surgical History: Past Surgical History:  Procedure Laterality Date   CARDIOVASCULAR STRESS TEST  2004   CARDIOLITE STUDY NORMAL/  EF 60%   CHOLECYSTECTOMY N/A 03/26/2012   Procedure: LAPAROSCOPIC CHOLECYSTECTOMY;  Surgeon: Romie Levee, MD;  Location: [facility];  Service: General;  Laterality: N/A;   CYSTO/   RIGHT URETERAL STENT EXCHANGE  12/2007   CYSTO/  RIGHT URETERAL STENT PLACEMENT  06/2006   CYSTOSCOPY  07/11/2013   Procedure: CYSTOSCOPY RIGHT STENT REMOVAL;  Surgeon: Crist Fat, MD;  Location: [facility];  Service: Urology;;   CYSTOSCOPY WITH RETROGRADE PYELOGRAM, URETEROSCOPY AND STENT PLACEMENT Right 05/15/2013   Procedure: CYSTOSCOPY WITH RETROGRADE PYELOGRAM, URETEROSCOPY AND STENT PLACEMENT;  Surgeon: Crist Fat, MD;  Location: [facility];  Service: Urology;  Laterality: Right;   CYSTOSCOPY WITH RETROGRADE PYELOGRAM, URETEROSCOPY AND STENT PLACEMENT Right 05/30/2013   Procedure: CYSTOSCOPY WITH RIGHT RETROGRADE PYELOGRAM, URETEROSCOPY LASER LITHOTRIPSY AND STENT EXCHANGE ;  Surgeon: Earle Gell  Marlou Porch, MD;  Location[facility];  Service: Urology;  Laterality: Right;   EXTRACORPOREAL SHOCK WAVE LITHOTRIPSY     HOLMIUM LASER APPLICATION Right 05/30/2013   Procedure: HOLMIUM LASER APPLICATION;  Surgeon: Crist Fat, MD;  Location: [facility];  Service: Urology;  Laterality: Right;   LIVER BIOPSY N/A 03/26/2012   Procedure: LIVER BIOPSY;  Surgeon: Romie Levee, MD;  Location: [facility];  Service: General;  Laterality: N/A;     Inpatient Medications: Scheduled  Meds:  amLODipine  10 mg Oral QHS   aspirin EC  81 mg Oral Daily   atorvastatin  40 mg Oral QHS   chlorthalidone  12.5 mg Oral Daily   clopidogrel  75 mg Oral Daily   diltiazem  120 mg Oral Daily   enoxaparin (LOVENOX) injection  40 mg Subcutaneous Q24H   insulin aspart  0-15 Units Subcutaneous TID WC   isosorbide mononitrate  60 mg Oral QHS   losartan  100 mg Oral QHS   potassium chloride  10 mEq Oral BID   sodium chloride flush  3 mL Intravenous Q12H   Continuous Infusions:  sodium chloride     PRN Meds: sodium chloride, acetaminophen, HYDROcodone-acetaminophen, nitroGLYCERIN, perflutren lipid microspheres (DEFINITY) IV suspension, sodium chloride flush  Allergies:    Allergies  Allergen Reactions   Beta Adrenergic Blockers Other (See Comments)    Fatigue with metoprolol and carvedilol   Zofran [Ondansetron Hcl] Other (See Comments)    Causes "splitting headache"   Bupropion Other (See Comments)    Personality change   Compazine Itching and Anxiety    Altered mental status    Social History:   Social History   Socioeconomic History   Marital status: Legally Separated    Spouse name: Not on file   Number of children: Not on file   Years of education: Not on file   Highest education level: Not on file  Occupational History   Not on file  Tobacco Use   Smoking status: Every Day    Packs/day: 1.50    Years: 25.00    Total pack years: 37.50    Types: Cigarettes   Smokeless tobacco: Never  Substance and Sexual Activity   Alcohol use: No   Drug use: No   Sexual activity: Not on file  Other Topics Concern   Not on file  Social History Narrative   Not on file   Social Determinants of Health   Financial Resource Strain: Not on file  Food Insecurity: Not on file  Transportation Needs: Not on file  Physical Activity: Not on file  Stress: Not on file  Social Connections: Not on file  Intimate Partner Violence: Not on file     Family History:    Family  History  Problem Relation Age of Onset   Cancer - Lung Mother    Cancer Mother    Seizures Sister    Heart disease Father      ROS:  All other ROS reviewed and negative. Pertinent positives noted in the HPI.     Physical Exam/Data:   Vitals:   01/15/22 0745 01/15/22 0800 01/15/22 0902 01/15/22 1143  BP:  (!) 139/100 (!) 149/88 (!) 145/86  Pulse: 78 73 64 87  Resp: 12 13 18 20   Temp:   98.1 F (36.7 C) 98.4 F (36.9 C)  TempSrc:   Oral Oral  SpO2: 97% 94% 99% 99%  Weight:   116.1 kg   Height:  6' (1.829 m)     Intake/Output Summary (Last 24 hours) at 01/15/2022 1452 Last data filed at 01/15/2022 1015 Gross per 24 hour  Intake 300 ml  Output --  Net 300 ml       01/15/2022    9:02 AM 06/06/2019    9:27 PM 09/19/2018    2:10 PM  Last 3 Weights  Weight (lbs) 255 lb 15.3 oz 231 lb 245 lb  Weight (kg) 116.1 kg 104.781 kg 111.131 kg    Body mass index is 34.71 kg/m.  General: Well nourished, well developed, in no acute distress Head: Atraumatic, normal size  Eyes: PEERLA, EOMI  Neck: Supple, no JVD Endocrine: No thryomegaly Cardiac: Normal S1, S2; RRR; no murmurs, rubs, or gallops Lungs: Clear to auscultation bilaterally, no wheezing, rhonchi or rales  Abd: Soft, nontender, no hepatomegaly  Ext: No edema, pulses 2+ Musculoskeletal: No deformities, BUE and BLE strength normal and equal Skin: Warm and dry, no rashes   Neuro: Alert and oriented to person, place, time, and situation, CNII-XII grossly intact, no focal deficits  Psych: Normal mood and affect   EKG:  The EKG was personally reviewed and demonstrates: Normal sinus rhythm heart rate 91, left axis deviation, no acute ischemic changes Telemetry:  Telemetry was personally reviewed and demonstrates: Sinus rhythm in the 60s  Relevant CV Studies:  Left heart catheterization 07/13/2021 at [facility] clinic Left main: Luminal irregularities LAD: Luminal irregularities Left circumflex: Luminal  irregularities Proximal RCA: 90% stenosis status post PCI  Laboratory Data: High Sensitivity Troponin:   Recent Labs  Lab 01/14/22 1642 01/14/22 1842  TROPONINIHS 7 7     Cardiac EnzymesNo results for input(s): "TROPONINI" in the last 168 hours. No results for input(s): "TROPIPOC" in the last 168 hours.  Chemistry Recent Labs  Lab 01/14/22 1642  NA 139  K 3.8  CL 106  CO2 25  GLUCOSE 113*  BUN 19  CREATININE 0.98  CALCIUM 9.6  GFRNONAA >60  ANIONGAP 8    No results for input(s): "PROT", "ALBUMIN", "AST", "ALT", "ALKPHOS", "BILITOT" in the last 168 hours. Hematology Recent Labs  Lab 01/14/22 1642  WBC 5.6  RBC 5.64  HGB 16.4  HCT 46.9  MCV 83.2  MCH 29.1  MCHC 35.0  RDW 12.8  PLT 151   BNPNo results for input(s): "BNP", "PROBNP" in the last 168 hours.  DDimer No results for input(s): "DDIMER" in the last 168 hours.  Radiology/Studies:  DG Chest 2 View  Result Date: 01/14/2022 CLINICAL DATA:  Chest pain radiating to arm and neck, nausea, and shortness of breath beginning 1 hour ago EXAM: CHEST - 2 VIEW COMPARISON:  03/30/2017 FINDINGS: Normal heart size, mediastinal contours, and pulmonary vascularity. Lungs clear. No pleural effusion or pneumothorax. Bones unremarkable. IMPRESSION: Normal exam. Electronically Signed   By: Ulyses Southward M.D.   On: 01/14/2022 17:05    Assessment and Plan:   # Acute chest pain, noncardiac # CAD status post PCI to the RCA 06/2021 -Admitted with acute chest discomfort.  Symptoms have resolved.  Troponins are negative.  EKG is nonischemic.  Underwent left heart catheterization this summer and run in [place].  Underwent PCI to the proximal RCA.  No significant CAD in the other lesions.  His echocardiogram here is normal.  No wall motion abnormalities.  Formal read is pending. -Overall low suspicion for cardiac chest discomfort.  Suspect he could have small vessel discomfort or noncardiac chest pain.  Would recommend further titration of  medical  therapy and follow-up as an outpatient.  Increase home Imdur to 120 mg daily. -Continue aspirin 81 mg daily.  Continue Plavix. -Continue home amlodipine, diltiazem and losartan. -He follows with his cardiologist in [place].  [place]. -Stable for discharge.   [facility] will sign off.   Medication Recommendations: As above Other recommendations (labs, testing, etc):  none Follow up as an outpatient:  [place]  For questions or updates, please contact [facility] Please consult [email or web address] for contact info under   Signed, Gerri SporeWesley T. Flora Lipps'Neal, MD, [facility]ACC [place]  [facility]  01/15/2022 2:52 PM

## 2022-01-15 NOTE — ED Notes (Signed)
Pt is voicing frustration on having to stay in ED overnight/ this nurse explained that there are currently no beds available at this time. Pt wishes to speak to a provider/ ED provider made aware.

## 2022-01-15 NOTE — Assessment & Plan Note (Addendum)
LDL 49 in 06/2021  Continue lipitor 40mg  daily

## 2022-01-15 NOTE — H&P (Signed)
History and Physical    Patient: Michael Carroll VOZ:[id number] DOB: 1960/02/07 DOA: 01/14/2022 DOS: the patient was seen and examined on 01/15/2022 PCP: Pcp, No  Patient coming from:  [facility]  - lives with his wife and up in [place].    Chief Complaint: chest pain   HPI: Michael Carroll is a 61 y.o. male with medical history significant of  coronary artery disease status post NSTEMI with PCI with stenting of RCA in June 2023([place]), hypertension, hyperlipidemia, type 2 diabetes, who presented with nonexertional chest pain, left-sided, mildly reproducible on palpation, worsened with exertion, felt similar to prior event that led to cardiac stent placement.    Last 3 days he would have some chest pain and he had to use NG twice as it got more intense. Chest pain feels like an ice pick stabbing him in the heart.  Yesterday he walked out in the yard and he sudden chest pain that radiated to his left arm and up his neck and this felt like when he had his first stent placed. He had some nausea and shortness of breath.  He took a NG and went to the ED. He states he has not had the pain since ED gave him NG and ASA. He has no pain at this time.   Denies any fever/chills, vision changes/headaches, palpitations, shortness of breath or cough, abdominal pain, N/V/D, dysuria or leg swelling.    He smokes 1/4 PPD and does not drink alcohol.   Followed by Dr. Dorathy Daft, cardiology at [facility] He has had refractory angina in spite of medical mgmt.   Prior Cardiac Studies  7d Event Monitor 08/2021- NSR and atrial tachycardia. 4/9 events with Atrial or sinus tachycardia, 5/0 with NSR. No Afib, VT, or pauses.   48h Holter 08/2021- NSR. Short (<10 beat) runs of atrial tachycardoa  LHC 07/13/21- 90% pRCA lesion. S/p PCI. Mild LAD and LCx disease.  PET stress 07/01/21- intermediate risk. Medium size mild severe reversible inferior/inferolateral defect c/w RCA vs LCx ischemia. SSS 8, SRS 2.   TTE 06/30/21-  LVEF 60-65%, Grade 1 DD, aortic sclerosis.  Renal duplex 10/21: normal   ER Course:  vitals: afebrile, bp: 131/71, HR; 65, RR: 18k oxygen: 98%RA Pertinent labs: troponin wnl x 2.  CXR: normal  In ED: given ASA and NG, TRH asked to admit    Review of Systems: As mentioned in the history of present illness. All other systems reviewed and are negative. Past Medical History:  Diagnosis Date   At risk for sleep apnea    STOP-BANG=  4      Cirrhosis of liver without mention of alcohol    PER LIVER BX 03/2012   Headache(784.0)    migraines   History of kidney stones    History of migraine    Hypertension    NO MEDS   Kidney stone    Productive cough    Right ureteral stone    Smokers' cough ([place])    Type 2 diabetes, diet controlled ([place])    Past Surgical History:  Procedure Laterality Date   CARDIOVASCULAR STRESS TEST  2004   CARDIOLITE STUDY NORMAL/  EF 60%   CHOLECYSTECTOMY N/A 03/26/2012   Procedure: LAPAROSCOPIC CHOLECYSTECTOMY;  Surgeon: Leighton Ruff, MD;  Location: [facility];  Service: General;  Laterality: N/A;   CYSTO/   RIGHT URETERAL STENT EXCHANGE  12/2007   CYSTO/  RIGHT URETERAL STENT PLACEMENT  06/2006   CYSTOSCOPY  07/11/2013   Procedure: CYSTOSCOPY  RIGHT STENT REMOVAL;  Surgeon: Ardis Hughs, MD;  Location: [facility];  Service: Urology;;   CYSTOSCOPY WITH RETROGRADE PYELOGRAM, URETEROSCOPY AND STENT PLACEMENT Right 05/15/2013   Procedure: CYSTOSCOPY WITH RETROGRADE PYELOGRAM, URETEROSCOPY AND STENT PLACEMENT;  Surgeon: Ardis Hughs, MD;  Location: [facility];  Service: Urology;  Laterality: Right;   CYSTOSCOPY WITH RETROGRADE PYELOGRAM, URETEROSCOPY AND STENT PLACEMENT Right 05/30/2013   Procedure: CYSTOSCOPY WITH RIGHT RETROGRADE PYELOGRAM, URETEROSCOPY LASER LITHOTRIPSY AND STENT EXCHANGE ;  Surgeon: Ardis Hughs, MD;  Location: [facility];  Service: Urology;  Laterality: Right;   EXTRACORPOREAL SHOCK WAVE LITHOTRIPSY     HOLMIUM LASER  APPLICATION Right 07/24/2374   Procedure: HOLMIUM LASER APPLICATION;  Surgeon: Ardis Hughs, MD;  Location: [facility];  Service: Urology;  Laterality: Right;   LIVER BIOPSY N/A 03/26/2012   Procedure: LIVER BIOPSY;  Surgeon: Leighton Ruff, MD;  Location: [facility];  Service: General;  Laterality: N/A;   Social History:  reports that he has been smoking cigarettes. He has a 37.50 pack-year smoking history. He has never used smokeless tobacco. He reports that he does not drink alcohol and does not use drugs.  Allergies  Allergen Reactions   Beta Adrenergic Blockers Other (See Comments)    Fatigue with metoprolol and carvedilol   Zofran [Ondansetron Hcl] Other (See Comments)    Causes "splitting headache"   Bupropion Other (See Comments)    Personality change   Compazine Itching and Anxiety    Altered mental status    Family History  Problem Relation Age of Onset   Cancer - Lung Mother    Cancer Mother    Seizures Sister    Heart disease Father     Prior to Admission medications   Medication Sig Start Date End Date Taking? Authorizing Provider  amLODipine (NORVASC) 10 MG tablet Take 1 tablet (10 mg total) by mouth daily. 09/19/18   Ar[place]ina Donovan, PA-C  aspirin EC 81 MG tablet Take 81 mg by mouth daily.    [provider]  Blood Glucose Monitoring Suppl (TRUE METRIX METER) w/Device KIT 1 each by Does not apply route 2 (two) times daily. 09/19/18   Argentina Donovan, PA-C  glipiZIDE (GLUCOTROL) 5 MG tablet Take 1 tablet (5 mg total) by mouth 2 (two) times daily before a meal. 09/19/18   McClung, Dionne Bucy, PA-C  glucose blood (TRUE METRIX BLOOD GLUCOSE TEST) test strip Use as instructed 09/19/18   Argentina Donovan, PA-C  metFORMIN (GLUCOPHAGE) 500 MG tablet Take 1 tablet (500 mg total) by mouth 2 (two) times daily with a meal. 03/30/17   Davonna Belling, MD  oxycodone (OXY-IR) 5 MG capsule Take 1 capsule (5 mg total) by mouth every 6 (six) hours as needed for  pain. 06/07/19   Molpus, John, MD  TRUEplus Lancets 28G MISC 1 each by Does not apply route 2 (two) times daily. 09/19/18   Argentina Donovan, PA-C  metoCLOPramide (REGLAN) 10 MG tablet Take 1 tablet (10 mg total) by mouth every 8 (eight) hours as needed (headache). 10/15/15 06/07/19  Everlene Balls, MD    Physical Exam: Vitals:   01/15/22 0742 01/15/22 0745 01/15/22 0800 01/15/22 0902  BP: 133/88  (!) 139/100 (!) 149/88  Pulse: 87 78 73 64  Resp: _0 Temp: 98.7 F (37.1 C)   98.1 F (36.7 C)  TempSrc: Oral   Oral  SpO2: 95% 97% 94% 99%  Weight:  116.1 kg  Height:    6' (1.829 m)   General:  Appears calm and comfortable and is in NAD Eyes:  PERRL, EOMI, normal lids, iris ENT:  grossly normal hearing, lips & tongue, mmm; poor dentition  Neck:  no LAD, masses or thyromegaly; no carotid bruits Cardiovascular:  RRR, no m/r/g. No LE edema.  Respiratory:   CTA bilaterally with no wheezes/rales/rhonchi.  Normal respiratory effort. Abdomen:  soft, NT, ND, NABS Back:   normal alignment, no CVAT Skin:  no rash or induration seen on limited exam Musculoskeletal:  grossly normal tone BUE/BLE, good ROM, no bony abnormality Lower extremity:  No LE edema.  Limited foot exam with no ulcerations.  2+ distal pulses. Psychiatric:  grossly normal mood and affect, speech fluent and appropriate, AOx3 Neurologic:  CN 2-12 grossly intact, moves all extremities in coordinated fashion, sensation intact   Radiological Exams on Admission: Independently reviewed - see discussion in A/P where applicable  DG Chest 2 View  Result Date: 01/14/2022 CLINICAL DATA:  Chest pain radiating to arm and neck, nausea, and shortness of breath beginning 1 hour ago EXAM: CHEST - 2 VIEW COMPARISON:  03/30/2017 FINDINGS: Normal heart size, mediastinal contours, and pulmonary vascularity. Lungs clear. No pleural effusion or pneumothorax. Bones unremarkable. IMPRESSION: Normal exam. Electronically Signed   By: Lavonia Dana  M.D.   On: 01/14/2022 17:05    EKG: Independently reviewed.  NSR with rate 91; nonspecific ST changes with no evidence of acute ischemia   Labs on Admission: I have personally reviewed the available labs and imaging studies at the time of the admission.  Pertinent labs:   Troponin wnl x2   Assessment and Plan: Principal Problem:   Chest pain in setting of known CAD and refractory angina Active Problems:   Hypertension   Type 2 diabetes mellitus with hyperglycemia (HCC)   Hyperlipidemia with target LDL less than 70   Atrial tachycardia   Coronary artery disease involving native coronary artery of native heart without angina pectoris   Tobacco abuse    Assessment and Plan: * Chest pain in setting of known CAD and refractory angina 61 year old male presenting with chest pain, concerning for ACS, with  history of NSTEMI in 6/23 s/p PCI to RCA and known refractory angina despite medical management  -obs to telemetry -troponin wnl x 2 and EKG with no acute changes. He is chest pain free after ASA and NG -repeat echo pending. (Echo 6/23: normal EF with grade 1 DD, aortic sclerosis)  - ASA 43m qd lifelong - Clopidogrel 759mqd through June 2024 - continue atorvastatin 4079mLDL to goal, repeat fastin lipid panel in AM  -Intolerant of beta-blockers  -per his cardiology note if chest pain continues, consider further ischemic w/u. Cardiology consulted.   Hypertension Continue current management with  Norvasc 75m22mily Chlorthalidone 25mg17mly Losartan 100mg 22my  Imdur 60mg d97m Cardizem 120mg da81m(for AT)   Type 2 diabetes mellitus with hyperglycemia (HCC) Recent A1C 6.8 I6Od trulicity  SSI and accuchecks QaC/HS, moderate scale    Hyperlipidemia with target LDL less than 70 LDL 49 in 06/2021  Continue lipitor 40mg dai45mRepeat fasting lipid panel in AM   Atrial tachycardia diltiazem 120mg qd  37macco abuse Down to 1/4 PPD Declines nicotine patch     Advance  Care Planning:   Code Status: Full Code   Consults: cardiology   DVT Prophylaxis: lovenox   Family Communication: none   Severity of  Illness: The appropriate patient status for this patient is OBSERVATION. Observation status is judged to be reasonable and necessary in order to provide the required intensity of service to ensure the patient's safety. The patient's presenting symptoms, physical exam findings, and initial radiographic and laboratory data in the context of their medical condition is felt to place them at decreased risk for further clinical deterioration. Furthermore, it is anticipated that the patient will be medically stable for discharge from the hospital within 2 midnights of admission.   Author: Orma Flaming, MD 01/15/2022 11:24 AM  For on call review [email or web address].

## 2022-01-15 NOTE — ED Provider Notes (Signed)
Patient admitted for high risk chest pain.  He was seen and examined on rounds this morning and he was awake and alert with no acute complaints.  He states he is currently chest pain-free.  Physical Exam  BP 127/78 (BP Location: Left Arm)   Pulse 88   Temp 98.2 F (36.8 C) (Oral)   Resp 17   SpO2 97%   Physical Exam Vitals and nursing note reviewed.  Constitutional:      General: He is not in acute distress.    Appearance: He is well-developed.  HENT:     Head: Normocephalic and atraumatic.  Cardiovascular:     Rate and Rhythm: Normal rate and regular rhythm.     Heart sounds: Normal heart sounds.  Pulmonary:     Effort: Pulmonary effort is normal.     Breath sounds: Normal breath sounds.  Abdominal:     Palpations: Abdomen is soft.  Musculoskeletal:        General: Normal range of motion.     Cervical back: Normal range of motion and neck supple.  Skin:    General: Skin is warm and dry.  Neurological:     General: No focal deficit present.     Mental Status: He is alert and oriented to person, place, and time.     Procedures  Procedures  ED Course / MDM    Medical Decision Making Amount and/or Complexity of Data Reviewed Labs: ordered. Radiology: ordered.  Risk OTC drugs. Prescription drug management. Decision regarding hospitalization.   Patient's pending inpatient bed for further evaluation of his high risk chest pain.       Rexford Maus, DO 01/15/22 [phone]

## 2022-01-15 NOTE — Assessment & Plan Note (Addendum)
-  chest pain free since arrival to ED s/p Asa and NG  -troponin wnl x 2 and EKG with no acute changes. He is chest pain free after ASA and NG -repeat echo wnl per cardiology, official read pending.  (Echo 6/23: normal EF with grade 1 DD, aortic sclerosis)  - ASA 81mg  qd lifelong - Clopidogrel 75mg  qd through June 2024 - continue atorvastatin 40mg , LDL to goal -Intolerant of beta-blockers  -per his cardiology note if chest pain continues, consider further ischemic w/u. Cardiology consulted. Saw patient and recommended discharge, reviewed echo and wnl. Increased imdur to 120mg  and recommended follow up with his cardiologist in .

## 2022-01-15 NOTE — Discharge Summary (Signed)
Physician Discharge Summary   Patient: Michael Carroll MRN: [id number] DOB: 1960/11/07  Admit date:     01/14/2022  Discharge date: 01/15/22  Discharge Physician: Orma Flaming   PCP: Pcp, No   Recommendations at discharge:  F/u with cardiology in [place] to 141m daily    Discharge Diagnoses: Principal Problem:   Chest pain in setting of known CAD and refractory angina Active Problems:   Hypertension   Type 2 diabetes mellitus with hyperglycemia ([place]   Hyperlipidemia with target LDL less than 70   Atrial tachycardia   Coronary artery disease involving native coronary artery of native heart without angina pectoris   Tobacco abuse  Resolved Problems:   * No resolved hospital problems. *  Hospital Course: 61year old male presenting with chest pain, concerning for ACS, with  history of NSTEMI in 6/23 s/p PCI to RCA and known refractory angina despite medical management found to have negative troponin, normal repeat echocardiogram, no changes on EKG and no further chest pain after arrival to ED and s/p ASA and NG. Cardiology consulted and recommended increase imdur to 1230m reviewed echo and recommended discharge home with f/u with his cardiologist.   Assessment and Plan: * Chest pain in setting of known CAD and refractory angina -chest pain free since arrival to ED s/p Asa and NG  -troponin wnl x 2 and EKG with no acute changes. He is chest pain free after ASA and NG -repeat echo wnl per cardiology, official read pending.  (Echo 6/23: normal EF with grade 1 DD, aortic sclerosis)  - ASA 8171md lifelong - Clopidogrel 32m53m through June 2024 - continue atorvastatin 40mg66mL to goal -Intolerant of beta-blockers  -per his cardiology note if chest pain continues, consider further ischemic w/u. Cardiology consulted. Saw patient and recommended discharge, reviewed echo and wnl. Increased imdur to 120mg 39mrecommended [place] current management with  Norvasc 10mg d4m Chlorthalidone 25mg da12mLosartan 100mg dai90mImdur 60mg dail22mnow changed to 120mg daily72m cardiology  Cardizem 120mg daily 49m AT)   Type 2 diabetes mellitus with hyperglycemia (HCC) Recent A1C 6.8 continue trulicity     Hyperlipidemia with target LDL less than 70 LDL 49 in 06/2021  Continue lipitor 40mg daily  9mrial tachycardia diltiazem 120mg qd   Tob42m abuse Down to 1/4 PPD Encouraged cessation of smoking     Consultants: cardiology: Dr. O'Neal  ProcedAudie Boxerformed: echocardiogram   Disposition: Home Diet recommendation:  Discharge Diet Orders (From admission, onward)     Start     Ordered   01/15/22 0000  Diet - low sodium heart healthy        01/15/22 1606           Cardiac diet DISCHARGE MEDICATION: Allergies as of 01/15/2022       Reactions   Beta Adrenergic Blockers Other (See Comments)   Fatigue with metoprolol and carvedilol   Zofran [ondansetron Hcl] Other (See Comments)   Causes "splitting headache"   Bupropion Other (See Comments)   Personality change   Compazine Itching, Anxiety   Altered mental status        Medication List     TAKE these medications    amLODipine 10 MG tablet Commonly known as: NORVASC Take 1 tablet (10 mg total) by mouth daily.   aspirin EC 81 MG tablet Take 81 mg by mouth 2 (two) times daily.  atorvastatin 40 MG tablet Commonly known as: LIPITOR Take 40 mg by mouth at bedtime.   chlorthalidone 25 MG tablet Commonly known as: HYGROTON Take 12.5 mg by mouth daily.   clopidogrel 75 MG tablet Commonly known as: PLAVIX Take 75 mg by mouth daily.   diltiazem 120 MG 24 hr capsule Commonly known as: CARDIZEM CD Take 120 mg by mouth daily.   HYDROcodone-acetaminophen 5-325 MG tablet Commonly known as: NORCO/VICODIN Take 1 tablet by mouth 2 (two) times daily as needed for moderate pain.   isosorbide mononitrate 120 MG  24 hr tablet Commonly known as: IMDUR Take 1 tablet (120 mg total) by mouth at bedtime. What changed:  medication strength how much to take when to take this   losartan 100 MG tablet Commonly known as: COZAAR Take 100 mg by mouth daily.   nitroGLYCERIN 0.4 MG SL tablet Commonly known as: NITROSTAT Place 0.4 mg under the tongue every 5 (five) minutes as needed for chest pain.   potassium citrate 10 MEQ (1080 MG) SR tablet Commonly known as: UROCIT-K Take 10 mEq by mouth 2 (two) times daily.   True Metrix Blood Glucose Test test strip Generic drug: glucose blood Use as instructed   True Metrix Meter w/Device Kit 1 each by Does not apply route 2 (two) times daily.   TRUEplus Lancets 28G Misc 1 each by Does not apply route 2 (two) times daily.   Trulicity 1.30 QM/5.7QI Sopn Generic drug: Dulaglutide Inject 0.75 mg into the skin once a week. Sunday        Discharge Exam: Filed Weights   01/15/22 0902  Weight: 116.1 kg     Condition at discharge: good  The results of significant diagnostics from this hospitalization (including imaging, microbiology, ancillary and laboratory) are listed below for reference.   Imaging Studies: DG Chest 2 View  Result Date: 01/14/2022 CLINICAL DATA:  Chest pain radiating to arm and neck, nausea, and shortness of breath beginning 1 hour ago EXAM: CHEST - 2 VIEW COMPARISON:  03/30/2017 FINDINGS: Normal heart size, mediastinal contours, and pulmonary vascularity. Lungs clear. No pleural effusion or pneumothorax. Bones unremarkable. IMPRESSION: Normal exam. Electronically Signed   By: Lavonia Dana M.D.   On: 01/14/2022 17:05    Microbiology: Results for orders placed or performed during the hospital encounter of 09/12/18  Urine culture     Status: Abnormal   Collection Time: 09/12/18  9:27 PM   Specimen: Urine, Clean Catch  Result Value Ref Range Status   Specimen Description   Final    URINE, CLEAN CATCH Performed at [facility], [address] [place],  [address]    Special Requests   Final    NONE Performed at [facility], [address] [place], [place] [address]    Culture (A)  Final    <10,000 COLONIES/mL INSIGNIFICANT GROWTH Performed at [facility], [place] [address] [place],  [address]    Report Status 09/14/2018 FINAL  Final    Labs: CBC: Recent Labs  Lab 01/14/22 1642  WBC 5.6  HGB 16.4  HCT 46.9  MCV 83.2  PLT 102   Basic Metabolic Panel: Recent Labs  Lab 01/14/22 1642  NA 139  K 3.8  CL 106  CO2 25  GLUCOSE 113*  BUN 19  CREATININE 0.98  CALCIUM 9.6   Liver Function Tests: No results for input(s): "AST", "ALT", "ALKPHOS", "BILITOT", "PROT", "ALBUMIN" in the last 168 hours. CBG:  Recent Labs  Lab 01/15/22 1141 01/15/22 1641  GLUCAP 184* 127*    Discharge time spent: less than 30 minutes.  Signed: Orma Flaming, MD [facility] 01/15/2022

## 2022-01-15 NOTE — Assessment & Plan Note (Signed)
diltiazem 120mg  qd

## 2022-01-15 NOTE — Assessment & Plan Note (Addendum)
Down to 1/4 PPD Encouraged cessation of smoking

## 2022-01-15 NOTE — ED Notes (Signed)
Called to give report to nurse. Nurse still in bedside report

## 2022-01-15 NOTE — Assessment & Plan Note (Addendum)
Continue current management with  Norvasc 10mg  daily Chlorthalidone 25mg  daily Losartan 100mg  daily  Imdur 60mg  daily-->now changed to 120mg  daily per cardiology  Cardizem 120mg  daily (for AT)

## 2022-01-15 NOTE — Progress Notes (Signed)
Echocardiogram 2D Echocardiogram has been performed.  Michael Carroll 01/15/2022, 1:39 PM

## 2022-01-15 NOTE — Assessment & Plan Note (Addendum)
Recent A1C 6.8 continue trulicity

## 2022-01-25 ENCOUNTER — Other Ambulatory Visit (HOSPITAL_BASED_OUTPATIENT_CLINIC_OR_DEPARTMENT_OTHER): Payer: Self-pay

## 2022-04-12 ENCOUNTER — Encounter: Payer: Self-pay | Admitting: *Deleted

## 2022-05-12 ENCOUNTER — Other Ambulatory Visit: Payer: Self-pay

## 2022-05-12 ENCOUNTER — Emergency Department (HOSPITAL_COMMUNITY)
Admission: EM | Admit: 2022-05-12 | Discharge: 2022-05-12 | Disposition: A | Discharge status: Home / Self Care | Payer: Medicaid - Out of State | Attending: Emergency Medicine | Admitting: Emergency Medicine

## 2022-05-12 ENCOUNTER — Emergency Department (HOSPITAL_COMMUNITY): Payer: Medicaid - Out of State

## 2022-05-12 ENCOUNTER — Encounter (HOSPITAL_COMMUNITY): Payer: Self-pay

## 2022-05-12 DIAGNOSIS — Z7982 Long term (current) use of aspirin: Secondary | ICD-10-CM | POA: Diagnosis not present

## 2022-05-12 DIAGNOSIS — I10 Essential (primary) hypertension: Secondary | ICD-10-CM | POA: Diagnosis not present

## 2022-05-12 DIAGNOSIS — R739 Hyperglycemia, unspecified: Secondary | ICD-10-CM | POA: Diagnosis not present

## 2022-05-12 DIAGNOSIS — Z79899 Other long term (current) drug therapy: Secondary | ICD-10-CM | POA: Insufficient documentation

## 2022-05-12 DIAGNOSIS — Z955 Presence of coronary angioplasty implant and graft: Secondary | ICD-10-CM | POA: Insufficient documentation

## 2022-05-12 DIAGNOSIS — Z7902 Long term (current) use of antithrombotics/antiplatelets: Secondary | ICD-10-CM | POA: Diagnosis not present

## 2022-05-12 DIAGNOSIS — R109 Unspecified abdominal pain: Secondary | ICD-10-CM | POA: Insufficient documentation

## 2022-05-12 LAB — BASIC METABOLIC PANEL
Anion gap: 8 (ref 5–15)
BUN: 19 mg/dL (ref 6–20)
CO2: 24 mmol/L (ref 22–32)
Calcium: 9.2 mg/dL (ref 8.9–10.3)
Chloride: 105 mmol/L (ref 98–111)
Creatinine, Ser: 0.67 mg/dL (ref 0.61–1.24)
GFR, Estimated: >60 mL/min (ref >60)
Glucose, Bld: 158 mg/dL — ABNORMAL HIGH (ref 70–99)
Potassium: 3.8 mmol/L (ref 3.5–5.1)
Sodium: 137 mmol/L (ref 135–145)

## 2022-05-12 LAB — TROPONIN I (HIGH SENSITIVITY)
Troponin I (High Sensitivity): 7 ng/L (ref <18)
Troponin I (High Sensitivity): 7 ng/L (ref <18)

## 2022-05-12 LAB — CBC
HCT: 47.7 % (ref 39.0–52.0)
Hemoglobin: 16.7 g/dL (ref 13.0–17.0)
MCH: 29.2 pg (ref 26.0–34.0)
MCHC: 35 g/dL (ref 30.0–36.0)
MCV: 83.5 fL (ref 80.0–100.0)
Platelets: 153 10*3/uL (ref 150–400)
RBC: 5.71 MIL/uL (ref 4.22–5.81)
RDW: 12.4 % (ref 11.5–15.5)
WBC: 6.3 10*3/uL (ref 4.0–10.5)
nRBC: 0 % (ref 0.0–0.2)

## 2022-05-12 LAB — URINALYSIS, ROUTINE W REFLEX MICROSCOPIC
Bilirubin Urine: NEGATIVE
Glucose, UA: NEGATIVE mg/dL
Hgb urine dipstick: NEGATIVE
Ketones, ur: NEGATIVE mg/dL
Leukocytes,Ua: NEGATIVE
Nitrite: NEGATIVE
Protein, ur: NEGATIVE mg/dL
Specific Gravity, Urine: 1.026 (ref 1.005–1.030)
pH: 5 (ref 5.0–8.0)

## 2022-05-12 LAB — HEPATIC FUNCTION PANEL
ALT: 25 U/L (ref 0–44)
AST: 25 U/L (ref 15–41)
Albumin: 4.1 g/dL (ref 3.5–5.0)
Alkaline Phosphatase: 62 U/L (ref 38–126)
Bilirubin, Direct: 0.2 mg/dL (ref 0.0–0.2)
Indirect Bilirubin: 0.5 mg/dL (ref 0.3–0.9)
Total Bilirubin: 0.7 mg/dL (ref 0.3–1.2)
Total Protein: 7.6 g/dL (ref 6.5–8.1)

## 2022-05-12 LAB — LIPASE, BLOOD: Lipase: 48 U/L (ref 11–51)

## 2022-05-12 MED ORDER — HYDROMORPHONE HCL 1 MG/ML IJ SOLN
1.0000 mg | Freq: Once | INTRAMUSCULAR | Status: AC
  Administered 2022-05-12: 1 mg via INTRAVENOUS
  Filled 2022-05-12: qty 1

## 2022-05-12 MED ORDER — SODIUM CHLORIDE 0.9 % IV SOLN: Freq: Once | INTRAVENOUS | Status: AC

## 2022-05-12 NOTE — ED Triage Notes (Signed)
Pt to er, pt states that he is here for a kidney stone, states that he is having pain and his blood pressure is elevated, states that he was getting concerned about his blood pressure when he started having some chest tightness, L arm pain and dizziness.

## 2022-05-12 NOTE — ED Provider Triage Note (Signed)
Emergency Medicine Provider Triage Evaluation Note  ACE BERGFELD , a 62 y.o. male  was evaluated in triage.  Pt complains of flank pain. Known kidney stone. Elevated BP today, taking home meds. Some CP. No SOB  Passing kidney stones today, thinks more  Review of Systems  Positive: Flank pain, cp, HTN Negative: HA, numbness  Physical Exam  BP (!) 173/115 (BP Location: Right Arm)   Pulse (!) 101   Temp 98.5 F (36.9 C) (Oral)   Resp 18   Ht 6' (1.829 m)   Wt 113.4 kg   SpO2 99%   BMI 33.91 kg/m  Gen:   Awake, no distress   Resp:  Normal effort  MSK:   Moves extremities without difficulty  Other:    Medical Decision Making  Medically screening exam initiated at 7:27 PM.  Appropriate orders placed.  UNO ESAU was informed that the remainder of the evaluation will be completed by another provider, this initial triage assessment does not replace that evaluation, and the importance of remaining in the ED until their evaluation is complete.  Flank pain, cp, known kidney stone   Marquite Attwood A, PA-C 05/12/22 1929

## 2022-05-12 NOTE — ED Provider Notes (Addendum)
[place] EMERGENCY DEPARTMENT AT [facility] Provider Note   CSN: [id number] Arrival date & time: 05/12/22  1834     History  Chief Complaint  Patient presents with   Hypertension    Michael Carroll is a 62 y.o. male.  HPI Patient reports he had multiple kidney stones.  He reports for years he passes several at a time.  He reports often times he will go through several weeks of passing repeat kidney stones and then have a few weeks where he is pain-free.  He reports occasionally he has stones that just do not pass on their own and he is required a stent.  He reports for about 2 weeks he has been having pain in the right flank and thinks he has passed a couple stones already but he reports it got a lot worse over the past 2 days and now has had severe ongoing right flank pain.  He reports it has made him nauseated but has not vomited.  He reports he is compliant with his blood pressure medications but he got really concerned because his blood pressure did elevate especially when he was having pain and got up to 170s over 100.  He denies he is really had chest pain but thought a little bit of discomfort with no shortness of breath.  He reports he has been taking all of his heart medications.    Home Medications Prior to Admission medications   Medication Sig Start Date End Date Taking? Authorizing Provider  amLODipine (NORVASC) 10 MG tablet Take 1 tablet (10 mg total) by mouth daily. 09/19/18   Anders Simmonds, PA-C  aspirin EC 81 MG tablet Take 81 mg by mouth 2 (two) times daily.    [provider]  atorvastatin (LIPITOR) 40 MG tablet Take 40 mg by mouth at bedtime. 09/22/21   [provider]  Blood Glucose Monitoring Suppl (TRUE METRIX METER) w/Device KIT 1 each by Does not apply route 2 (two) times daily. 09/19/18   Anders Simmonds, PA-C  chlorthalidone (HYGROTON) 25 MG tablet Take 12.5 mg by mouth daily. 10/01/21   [provider]  clopidogrel  (PLAVIX) 75 MG tablet Take 75 mg by mouth daily. 09/22/21   [provider]  diltiazem (CARDIZEM CD) 120 MG 24 hr capsule Take 120 mg by mouth daily. 01/13/22   [provider]  glucose blood (TRUE METRIX BLOOD GLUCOSE TEST) test strip Use as instructed 09/19/18   Anders Simmonds, PA-C  HYDROcodone-acetaminophen (NORCO/VICODIN) 5-325 MG tablet Take 1 tablet by mouth 2 (two) times daily as needed for moderate pain.    [provider]  isosorbide mononitrate (IMDUR) 120 MG 24 hr tablet Take 1 tablet (120 mg total) by mouth at bedtime. 01/15/22 02/14/22  Orland Mustard, MD  losartan (COZAAR) 100 MG tablet Take 100 mg by mouth daily. 11/04/21   [provider]  nitroGLYCERIN (NITROSTAT) 0.4 MG SL tablet Place 0.4 mg under the tongue every 5 (five) minutes as needed for chest pain. 07/14/21   [provider]  potassium citrate (UROCIT-K) 10 MEQ (1080 MG) SR tablet Take 10 mEq by mouth 2 (two) times daily. 11/20/21   [provider]  TRUEplus Lancets 28G MISC 1 each by Does not apply route 2 (two) times daily. 09/19/18   Anders Simmonds, PA-C  TRULICITY 0.75 MG/0.5ML SOPN Inject 0.75 mg into the skin once a week. Sunday 12/09/21   [provider]  metoCLOPramide (REGLAN) 10 MG  tablet Take 1 tablet (10 mg total) by mouth every 8 (eight) hours as needed (headache). 10/15/15 06/07/19  Tomasita Crumble, MD      Allergies    Beta adrenergic blockers, Zofran Frazier Richards hcl], Bupropion, and Compazine    Review of Systems   Review of Systems  Physical Exam Updated Vital Signs BP (!) 164/101   Pulse 61   Temp 98.5 F (36.9 C) (Oral)   Resp 14   Ht 6' (1.829 m)   Wt 113.4 kg   SpO2 98%   BMI 33.91 kg/m  Physical Exam Constitutional:      Comments: Patient is sitting at the edge of stretcher.  Alert nontoxic.  Mental status clear.  No respiratory distress.  HENT:     Mouth/Throat:     Pharynx: Oropharynx is clear.  Eyes:     Extraocular  Movements: Extraocular movements intact.  Cardiovascular:     Rate and Rhythm: Normal rate and regular rhythm.  Pulmonary:     Effort: Pulmonary effort is normal.     Breath sounds: Normal breath sounds.  Abdominal:     Comments: Positive for right flank pain to percussion.  Abdomen soft nontender.  Musculoskeletal:        General: No swelling or tenderness. Normal range of motion.     Right lower leg: No edema.     Left lower leg: No edema.  Skin:    General: Skin is warm and dry.  Neurological:     General: No focal deficit present.     Mental Status: He is oriented to person, place, and time.     ED Results / Procedures / Treatments   Labs (all labs ordered are listed, but only abnormal results are displayed) Labs Reviewed  BASIC METABOLIC PANEL - Abnormal; Notable for the following components:      Result Value   Glucose, Bld 158 (*)    All other components within normal limits  CBC  HEPATIC FUNCTION PANEL  LIPASE, BLOOD  URINALYSIS, ROUTINE W REFLEX MICROSCOPIC  TROPONIN I (HIGH SENSITIVITY)  TROPONIN I (HIGH SENSITIVITY)    EKG EKG Interpretation  Date/Time:  Wednesday May 12 2022 18:50:36 EDT Ventricular Rate:  103 PR Interval:  184 QRS Duration: 94 QT Interval:  347 QTC Calculation: 455 R Axis:   253 Text Interpretation: Sinus tachycardia Left anterior fascicular block Consider right ventricular hypertrophy duplicate Confirmed by Arby Barrette [phone]) on 05/12/2022 9:03:44 PM  Radiology CT Renal Stone Study  Result Date: 05/12/2022 CLINICAL DATA:  Right flank pain. EXAM: CT ABDOMEN AND PELVIS WITHOUT CONTRAST TECHNIQUE: Multidetector CT imaging of the abdomen and pelvis was performed following the standard protocol without IV contrast. RADIATION DOSE REDUCTION: This exam was performed according to the departmental dose-optimization program which includes automated exposure control, adjustment of the mA and/or kV according to patient size and/or use of  iterative reconstruction technique. COMPARISON:  September 12, 2018 FINDINGS: Lower chest: No acute abnormality. Hepatobiliary: The liver is mildly cirrhotic in appearance. No focal liver abnormality is seen. Status post cholecystectomy. No biliary dilatation. Pancreas: Unremarkable. No pancreatic ductal dilatation or surrounding inflammatory changes. Spleen: Normal in size without focal abnormality. Adrenals/Urinary Tract: The right adrenal gland is unremarkable. The left adrenal gland is mildly nodular in appearance. Kidneys are normal in size, without renal calculi or focal lesions. There is mild, stable right-sided hydronephrosis. Bladder is unremarkable. Stomach/Bowel: Stomach is within normal limits. Appendix appears normal. No evidence of bowel wall thickening, distention, or inflammatory changes.  Noninflamed diverticula are seen throughout the large bowel. Vascular/Lymphatic: Aortic atherosclerosis. No enlarged abdominal or pelvic lymph nodes. Reproductive: The prostate gland is mildly enlarged. Other: No abdominal wall hernia or abnormality. No abdominopelvic ascites. Musculoskeletal: No acute or significant osseous findings. IMPRESSION: 1. Mild, stable right-sided hydronephrosis without obstructing renal calculi. 2. Colonic diverticulosis. 3. Evidence of prior cholecystectomy. 4. Mildly nodular appearance of the left adrenal gland which may represent adrenal hyperplasia. Correlation with nonemergent adrenal protocol CT is recommended. This recommendation follows ACR consensus guidelines: Management of Incidental Adrenal Masses: A White Paper of the ACR Incidental Findings Committee. J Am Coll Radiol 2017;14:1038-1044. This recommendation follows ACR consensus guidelines: Management of Incidental Adrenal Masses: A White Paper of the ACR Incidental Findings Committee. J Am Coll Radiol 2017;14:1038-1044. 5. Mild prostatomegaly. 6. Aortic atherosclerosis. Aortic Atherosclerosis (ICD10-I70.0). Electronically  Signed   By: Aram Candela M.D.   On: 05/12/2022 21:51   DG Chest 2 View  Result Date: 05/12/2022 CLINICAL DATA:  Chest pain EXAM: CHEST - 2 VIEW COMPARISON:  01/14/2022 FINDINGS: Cardiac and mediastinal contours are within normal limits. No focal pulmonary opacity. No pleural effusion or pneumothorax. No acute osseous abnormality. IMPRESSION: No acute cardiopulmonary process. Electronically Signed   By: Wiliam Ke M.D.   On: 05/12/2022 19:16    Procedures Procedures    Medications Ordered in ED Medications  HYDROmorphone (DILAUDID) injection 1 mg (1 mg Intravenous Given 05/12/22 2119)  0.9 %  sodium chloride infusion ( Intravenous New Bag/Given 05/12/22 2121)  HYDROmorphone (DILAUDID) injection 1 mg (1 mg Intravenous Given 05/12/22 2233)    ED Course/ Medical Decision Making/ A&P                             Medical Decision Making Amount and/or Complexity of Data Reviewed Labs: ordered. Radiology: ordered.  Risk Prescription drug management.   Patient presents as outlined reporting long history of multiple kidney stones.  Patient also has cardiac history with stents.  With persistent worsening right flank pain will obtain CT study to rule out hydronephrosis or retained stone.  Differential diagnosis also includes pyelonephritis.  Patient was concern for hypertension.  Troponin also obtained.  Will treat for pain with Dilaudid.  Patient blood pressure spontaneously improving down to 140s improved pain control.  CT study does not show any apparent retained kidney stones.  Stable mild hydronephrosis on the right without any enlargement or change.  Metabolic panel normal with normal GFR.  CBG mildly elevated at 158.  White count 6.3 H&H normal at 16 and 14%.  First troponin is 7 and EKG is interpreted by myself shows no changes from previous tracings.  No acute ischemic appearance.  Patient recheck at 22: 40.  Patient is comfortable in appearance.  Alert no acute distress.  I  have reviewed diagnostic findings.  At this time plan will be to follow-up on second troponin and urine specimen.  I do feel patient will be stable for discharge if second troponin no elevation.  If urine shows any positive findings may need antibiotics but at this time would not require an admission based on current findings.  UA normal.  Second troponin normal.  At this time patient stable for discharge.  Final Clinical Impression(s) / ED Diagnoses Final diagnoses:  Flank pain  Essential hypertension    Rx / DC Orders ED Discharge Orders     None         Arby Barrette, MD  05/12/22 2246    Arby Barrette, MD 05/12/22 2330

## 2022-05-12 NOTE — ED Notes (Signed)
Pt A&OX4 ambulatory at d/c with independent steady gait. Pt verbalized understanding of d/c instructions and follow up care. 

## 2022-05-12 NOTE — Discharge Instructions (Addendum)
1.  Follow-up with your urologist as soon as possible for recheck. 2.  Take all of your regular prescribed medications when you get home. 3.  Return to the emergency department if you are developing worsening pain again, fever, vomiting or other concerning changes.

## 2022-05-12 NOTE — ED Notes (Signed)
Pt is requesting more pain medication. Dr. Donnald Garre informed via epic secure chat.

## 2022-05-12 NOTE — ED Notes (Signed)
P has returned from CT

## 2022-06-30 LAB — EXTERNAL GENERIC LAB PROCEDURE: COLOGUARD: NEGATIVE

## 2022-06-30 LAB — COLOGUARD: COLOGUARD: NEGATIVE
